# Patient Record
Sex: Female | Born: 1970 | Race: White | Hispanic: No | Smoking: Never smoker
Health system: Southern US, Community
[De-identification: ages and names within clinical notes are randomized; demographics above are authoritative.]

## PROBLEM LIST (undated history)

## (undated) DIAGNOSIS — I10 Essential (primary) hypertension: Secondary | ICD-10-CM

## (undated) DIAGNOSIS — G43909 Migraine, unspecified, not intractable, without status migrainosus: Secondary | ICD-10-CM

## (undated) DIAGNOSIS — E049 Nontoxic goiter, unspecified: Secondary | ICD-10-CM

## (undated) DIAGNOSIS — F32A Depression, unspecified: Secondary | ICD-10-CM

## (undated) DIAGNOSIS — Z8659 Personal history of other mental and behavioral disorders: Secondary | ICD-10-CM

## (undated) DIAGNOSIS — E669 Obesity, unspecified: Secondary | ICD-10-CM

## (undated) DIAGNOSIS — R634 Abnormal weight loss: Secondary | ICD-10-CM

## (undated) DIAGNOSIS — F419 Anxiety disorder, unspecified: Secondary | ICD-10-CM

## (undated) HISTORY — DX: Obesity, unspecified: E66.9

## (undated) HISTORY — DX: Nontoxic goiter, unspecified: E04.9

## (undated) HISTORY — DX: Anxiety disorder, unspecified: F41.9

## (undated) HISTORY — DX: Personal history of other mental and behavioral disorders: Z86.59

## (undated) HISTORY — DX: Depression, unspecified: F32.A

## (undated) HISTORY — DX: Migraine, unspecified, not intractable, without status migrainosus: G43.909

## (undated) HISTORY — DX: Essential (primary) hypertension: I10

## (undated) HISTORY — DX: Abnormal weight loss: R63.4

## (undated) HISTORY — PX: WISDOM TOOTH EXTRACTION: SHX21

---

## 1998-08-20 ENCOUNTER — Encounter: Admission: RE | Admit: 1998-08-20 | Discharge: 1998-11-18 | Payer: Self-pay | Admitting: *Deleted

## 1998-11-26 ENCOUNTER — Encounter: Admission: RE | Admit: 1998-11-26 | Discharge: 1999-02-24 | Payer: Self-pay | Admitting: *Deleted

## 1999-03-25 ENCOUNTER — Encounter: Admission: RE | Admit: 1999-03-25 | Discharge: 1999-06-23 | Payer: Self-pay | Admitting: *Deleted

## 1999-07-17 ENCOUNTER — Encounter: Admission: RE | Admit: 1999-07-17 | Discharge: 1999-10-15 | Payer: Self-pay | Admitting: *Deleted

## 1999-10-16 ENCOUNTER — Encounter: Admission: RE | Admit: 1999-10-16 | Discharge: 2000-01-14 | Payer: Self-pay | Admitting: *Deleted

## 2000-01-29 ENCOUNTER — Encounter: Admission: RE | Admit: 2000-01-29 | Discharge: 2000-04-28 | Payer: Self-pay | Admitting: *Deleted

## 2001-05-11 ENCOUNTER — Encounter: Payer: Self-pay | Admitting: Family Medicine

## 2001-05-11 ENCOUNTER — Encounter: Admission: RE | Admit: 2001-05-11 | Discharge: 2001-05-11 | Payer: Self-pay | Admitting: Family Medicine

## 2002-01-09 ENCOUNTER — Encounter: Payer: Self-pay | Admitting: Family Medicine

## 2002-01-09 ENCOUNTER — Encounter: Admission: RE | Admit: 2002-01-09 | Discharge: 2002-01-09 | Payer: Self-pay | Admitting: Family Medicine

## 2002-01-26 ENCOUNTER — Other Ambulatory Visit: Admission: RE | Admit: 2002-01-26 | Discharge: 2002-01-26 | Payer: Self-pay | Admitting: Obstetrics and Gynecology

## 2003-05-08 ENCOUNTER — Other Ambulatory Visit: Admission: RE | Admit: 2003-05-08 | Discharge: 2003-05-08 | Payer: Self-pay | Admitting: Obstetrics and Gynecology

## 2005-03-18 ENCOUNTER — Other Ambulatory Visit: Admission: RE | Admit: 2005-03-18 | Discharge: 2005-03-18 | Payer: Self-pay | Admitting: Obstetrics and Gynecology

## 2005-12-04 ENCOUNTER — Ambulatory Visit: Payer: Self-pay | Admitting: Family Medicine

## 2006-02-24 ENCOUNTER — Ambulatory Visit: Payer: Self-pay | Admitting: Family Medicine

## 2008-06-04 ENCOUNTER — Ambulatory Visit: Payer: Self-pay | Admitting: Family Medicine

## 2008-06-04 DIAGNOSIS — F418 Other specified anxiety disorders: Secondary | ICD-10-CM | POA: Insufficient documentation

## 2008-06-04 DIAGNOSIS — Z8659 Personal history of other mental and behavioral disorders: Secondary | ICD-10-CM | POA: Insufficient documentation

## 2008-06-04 DIAGNOSIS — I10 Essential (primary) hypertension: Secondary | ICD-10-CM | POA: Insufficient documentation

## 2008-06-04 DIAGNOSIS — G43009 Migraine without aura, not intractable, without status migrainosus: Secondary | ICD-10-CM | POA: Insufficient documentation

## 2008-06-05 DIAGNOSIS — E049 Nontoxic goiter, unspecified: Secondary | ICD-10-CM | POA: Insufficient documentation

## 2008-06-26 ENCOUNTER — Ambulatory Visit: Payer: Self-pay | Admitting: Family Medicine

## 2008-06-27 LAB — CONVERTED CEMR LAB
ALT: 25 units/L (ref 0–35)
AST: 25 units/L (ref 0–37)
Albumin: 4.1 g/dL (ref 3.5–5.2)
Alkaline Phosphatase: 72 units/L (ref 39–117)
BUN: 12 mg/dL (ref 6–23)
Basophils Absolute: 0 10*3/uL (ref 0.0–0.1)
Basophils Relative: 0.5 % (ref 0.0–3.0)
Bilirubin, Direct: 0.2 mg/dL (ref 0.0–0.3)
CO2: 30 meq/L (ref 19–32)
Calcium: 9.1 mg/dL (ref 8.4–10.5)
Chloride: 107 meq/L (ref 96–112)
Cholesterol: 148 mg/dL (ref 0–200)
Creatinine, Ser: 0.7 mg/dL (ref 0.4–1.2)
Eosinophils Absolute: 0.1 10*3/uL (ref 0.0–0.7)
Eosinophils Relative: 1.3 % (ref 0.0–5.0)
GFR calc Af Amer: 121 mL/min
GFR calc non Af Amer: 100 mL/min
Glucose, Bld: 98 mg/dL (ref 70–99)
HCT: 37.7 % (ref 36.0–46.0)
HDL: 46.7 mg/dL (ref >39.0)
Hemoglobin: 12.8 g/dL (ref 12.0–15.0)
LDL Cholesterol: 94 mg/dL (ref 0–99)
Lymphocytes Relative: 29.2 % (ref 12.0–46.0)
MCHC: 34 g/dL (ref 30.0–36.0)
MCV: 88.7 fL (ref 78.0–100.0)
Monocytes Absolute: 0.6 10*3/uL (ref 0.1–1.0)
Monocytes Relative: 9.4 % (ref 3.0–12.0)
Neutro Abs: 3.5 10*3/uL (ref 1.4–7.7)
Neutrophils Relative %: 59.6 % (ref 43.0–77.0)
Platelets: 242 10*3/uL (ref 150–400)
Potassium: 4.1 meq/L (ref 3.5–5.1)
RBC: 4.25 M/uL (ref 3.87–5.11)
RDW: 13.1 % (ref 11.5–14.6)
Sodium: 140 meq/L (ref 135–145)
TSH: 1.52 microintl units/mL (ref 0.35–5.50)
Total Bilirubin: 0.9 mg/dL (ref 0.3–1.2)
Total CHOL/HDL Ratio: 3.2
Total Protein: 7.5 g/dL (ref 6.0–8.3)
Triglycerides: 37 mg/dL (ref 0–149)
VLDL: 7 mg/dL (ref 0–40)
WBC: 6 10*3/uL (ref 4.5–10.5)

## 2008-07-05 ENCOUNTER — Ambulatory Visit: Payer: Self-pay | Admitting: Family Medicine

## 2008-10-12 ENCOUNTER — Ambulatory Visit: Payer: Self-pay | Admitting: Family Medicine

## 2009-01-11 ENCOUNTER — Ambulatory Visit: Payer: Self-pay | Admitting: Family Medicine

## 2009-04-16 ENCOUNTER — Ambulatory Visit: Payer: Self-pay | Admitting: Family Medicine

## 2009-08-07 ENCOUNTER — Ambulatory Visit: Payer: Self-pay | Admitting: Family Medicine

## 2010-04-30 ENCOUNTER — Ambulatory Visit: Payer: Self-pay | Admitting: Family Medicine

## 2010-05-01 LAB — CONVERTED CEMR LAB
ALT: 18 units/L (ref 0–35)
AST: 19 units/L (ref 0–37)
Albumin: 4.4 g/dL (ref 3.5–5.2)
Alkaline Phosphatase: 61 units/L (ref 39–117)
BUN: 19 mg/dL (ref 6–23)
Basophils Absolute: 0 10*3/uL (ref 0.0–0.1)
Basophils Relative: 0.2 % (ref 0.0–3.0)
Bilirubin, Direct: 0.2 mg/dL (ref 0.0–0.3)
CO2: 28 meq/L (ref 19–32)
Calcium: 8.8 mg/dL (ref 8.4–10.5)
Chloride: 100 meq/L (ref 96–112)
Cholesterol: 159 mg/dL (ref 0–200)
Creatinine, Ser: 0.7 mg/dL (ref 0.4–1.2)
Eosinophils Absolute: 0.1 10*3/uL (ref 0.0–0.7)
Eosinophils Relative: 1 % (ref 0.0–5.0)
Free T4: 0.98 ng/dL (ref 0.60–1.60)
GFR calc non Af Amer: 102.3 mL/min (ref >60)
Glucose, Bld: 99 mg/dL (ref 70–99)
HCT: 38.7 % (ref 36.0–46.0)
HDL: 58.2 mg/dL (ref >39.00)
Hemoglobin: 13.3 g/dL (ref 12.0–15.0)
LDL Cholesterol: 96 mg/dL (ref 0–99)
Lymphocytes Relative: 28.2 % (ref 12.0–46.0)
Lymphs Abs: 1.5 10*3/uL (ref 0.7–4.0)
MCHC: 34.3 g/dL (ref 30.0–36.0)
MCV: 89.7 fL (ref 78.0–100.0)
Monocytes Absolute: 0.4 10*3/uL (ref 0.1–1.0)
Monocytes Relative: 7 % (ref 3.0–12.0)
Neutro Abs: 3.4 10*3/uL (ref 1.4–7.7)
Neutrophils Relative %: 63.6 % (ref 43.0–77.0)
Platelets: 197 10*3/uL (ref 150.0–400.0)
Potassium: 4 meq/L (ref 3.5–5.1)
RBC: 4.32 M/uL (ref 3.87–5.11)
RDW: 13.7 % (ref 11.5–14.6)
Sodium: 137 meq/L (ref 135–145)
T3 Uptake Ratio: 35.2 % (ref 22.5–37.0)
TSH: 1.84 microintl units/mL (ref 0.35–5.50)
Total Bilirubin: 1.1 mg/dL (ref 0.3–1.2)
Total CHOL/HDL Ratio: 3
Total Protein: 7.4 g/dL (ref 6.0–8.3)
Triglycerides: 23 mg/dL (ref 0.0–149.0)
VLDL: 4.6 mg/dL (ref 0.0–40.0)
WBC: 5.4 10*3/uL (ref 4.5–10.5)

## 2010-09-10 ENCOUNTER — Telehealth (INDEPENDENT_AMBULATORY_CARE_PROVIDER_SITE_OTHER): Payer: Self-pay | Admitting: *Deleted

## 2010-09-12 ENCOUNTER — Ambulatory Visit: Payer: Self-pay | Admitting: Family Medicine

## 2010-09-16 ENCOUNTER — Ambulatory Visit: Payer: Self-pay | Admitting: Family Medicine

## 2010-11-05 ENCOUNTER — Other Ambulatory Visit: Payer: Self-pay

## 2010-11-05 HISTORY — PX: OTHER SURGICAL HISTORY: SHX169

## 2010-11-06 ENCOUNTER — Other Ambulatory Visit: Payer: Self-pay

## 2010-11-06 NOTE — Assessment & Plan Note (Signed)
Summary: 3 MONTH FOLLOW UP/[facility]   Vital Signs:  Patient Profile:   40 Years Old Female Weight:      244 pounds Temp:     98.8 degrees F oral Pulse rate:   56 / minute Pulse rhythm:   regular BP sitting:   120 / 84  (left arm) Cuff size:   large  Vitals Entered By: Providence Crosby (October 12, 2008 9:57 AM)                 Chief Complaint:  3 month followup.  History of Present Illness: Here for follow up of HBP, obesity, depression.  Doing well with depression not on meds--work still stressful.  Does not wasnt meds.  Obesity--was 266 on 07/05/08--going to [employer] and to the gym 4-5xqwk--treadmill 30 min each time plus some wts.  Son adn husband loosing some and going to gym as a family.  Son here with her --all smiles.  HBP--tolerating atenolol without side effects.    Prior Medications Reviewed Using: Patient Recall  Current Allergies: No known allergies      Review of Systems  CV      Denies chest pain or discomfort and palpitations.  Resp      Denies cough, shortness of breath, and wheezing.  Neuro      Denies difficulty with concentration, disturbances in coordination, and headaches.  Psych      See HPI      Denies anxiety and depression.   Physical Exam  General:     alert, well-developed, well-nourished, and well-hydrated.  wt lsoo of 22 lbs since 10/09 Neck:     no JVD and no carotid bruits.   Lungs:     normal respiratory effort, no intercostal retractions, no accessory muscle use, and normal breath sounds.   Heart:     normal rate, regular rhythm, and no murmur.   Extremities:     no edema either lower leg Neurologic:     alert & oriented X3 and gait normal.   Psych:     normally interactive, good eye contact, not anxious appearing, and not depressed appearing.  HAPPY!!!!    Impression & Recommendations:  Problem # 1:  HYPERTENSION, BENIGN ESSENTIAL (ICD-401.1) Assessment: Unchanged stable on current med continue current  med see back in 3 mo or prn Her updated medication list for this problem includes:    Atenolol 100 Mg Tabs (Atenolol) .Marland Kitchen... Take 1 tablet by mouth once a day  BP today: 120/84 Prior BP: 107/79 (07/05/2008)  Labs Reviewed: Creat: 0.7 (06/26/2008) Chol: 148 (06/26/2008)   HDL: 46.7 (06/26/2008)   LDL: 94 (06/26/2008)   TG: 37 (06/26/2008)   Problem # 2:  OBESITY (ICD-278.00) Assessment: Improved very pleased with her 22 lb wt loss--encouraged to continue see back in 3 mo  Problem # 3:  DEPRESSION (ICD-311) Assessment: Unchanged stable on no meds improved with wt losss  Complete Medication List: 1)  Atenolol 100 Mg Tabs (Atenolol) .... Take 1 tablet by mouth once a day 2)  Multivitamins Tabs (Multiple vitamin) .Marland Kitchen.. 1 daily by mouth   Patient Instructions: 1)  Please schedule a follow-up appointment in 3 months.--15 lbs off   ]

## 2010-11-06 NOTE — Progress Notes (Signed)
----   Converted from flag ---- ---- 09/09/2010 8:26 PM, Judith Part MD wrote: please check wellness and lipid v70.0 and 401.1 thanks  ---- 09/09/2010 12:11 PM, Liane Comber CMA (AAMA) wrote: Lab orders please! Good Morning! This pt is scheduled for cpx labs [place], which labs to draw and dx codes to use? Thanks Tasha ------------------------------

## 2010-11-06 NOTE — Assessment & Plan Note (Signed)
Summary: 1 M F/U  DLO   Vital Signs:  Patient Profile:   39 Years Old Female Weight:      266 pounds Temp:     98.7 degrees F oral Pulse rate:   55 / minute BP sitting:   107 / 79  (left arm) Cuff size:   large  Vitals Entered By: Cooper Render (July 05, 2008 9:32 AM)                 Chief Complaint:  1 mth f/u.  History of Present Illness: Here for follow up of HBP and obesity --tolerating atenolol which was restarted 1 mo ago --joined [employer] recently--very pleased with 4 lbs off--walking the dog and doing stationary bike almost daily.  eating patterns have made some major changes. --has new hair style, happpier with herself    Current Allergies (reviewed today): No known allergies      Review of Systems  CV      Denies chest pain or discomfort, palpitations, swelling of feet, and swelling of hands.  Resp      Denies shortness of breath and wheezing.  Psych      Denies anxiety and depression.   Physical Exam  General:     alert, well-developed, well-nourished, and well-hydrated.  wt down 4 lbs in past mo Neck:     no JVD, no carotid bruits, and thyromegaly.   Lungs:     normal respiratory effort, no intercostal retractions, no accessory muscle use, and normal breath sounds.   Heart:     normal rate, regular rhythm, and no murmur.   Extremities:     no edema either lower leg Neurologic:     alert & oriented X3 and gait normal.   Psych:     normally interactive, not anxious appearing, and not depressed appearing.      Impression & Recommendations:  Problem # 1:  HYPERTENSION, BENIGN ESSENTIAL (ICD-401.1) Assessment: Improved BP ios improved since restart of Atenolol will continue Atenolol at this dose and see back in 3 mo to follow, if stable will then see q43mo Her updated medication list for this problem includes:    Atenolol 100 Mg Tabs (Atenolol) .Marland Kitchen... Take 1 tablet by mouth once a day  BP today: 107/79 Prior BP: 134/104  (06/04/2008)  Labs Reviewed: Creat: 0.7 (06/26/2008) Chol: 148 (06/26/2008)   HDL: 46.7 (06/26/2008)   LDL: 94 (06/26/2008)   TG: 37 (06/26/2008)   Problem # 2:  OBESITY (ICD-278.00) Assessment: Improved she has had a 4 lb wt loss in the past mo but made major changes in her eating pattersn--ie, joining [employer] and starting to exercise.   much praise given and encouraged to continue--hopefully to see 10 lbs off in the next 3 mo  Problem # 3:  DEPRESSION (ICD-311) Assessment: Improved seems much happier with herself andf the changes that she has made.  encouraged follow  Complete Medication List: 1)  Atenolol 100 Mg Tabs (Atenolol) .... Take 1 tablet by mouth once a day   Patient Instructions: 1)  Please schedule a follow-up appointment in 3 months.   ] Prior Medications (reviewed today): ATENOLOL 100 MG TABS (ATENOLOL) Take 1 tablet by mouth once a day Current Allergies (reviewed today): No known allergies

## 2010-11-06 NOTE — Assessment & Plan Note (Signed)
Summary: 3 M F/U DLO   Vital Signs:  Patient profile:   40 year old female Height:      67 inches Weight:      213.25 pounds BMI:     33.52 Temp:     98.6 degrees F oral Pulse rate:   60 / minute Pulse rhythm:   regular BP sitting:   128 / 82  (left arm) Cuff size:   large  Vitals Entered By: Lewanda Rife LPN (August 07, 2009 2:22 PM)  CC:  three month follow up.  History of Present Illness: Here for 3 mo follow up of HBP and wt loss--tolerating atenolol without side effects --has cut going to gym back to 3xwk rather than 4-5--sees that wt is not coming offf as rapidly as it did with more exercise and no change in food intake--will attempt to increase exercise--may walk at home on the weekends rather than trip to gym  Preventive Screening-Counseling & Management  Caffeine-Diet-Exercise     Does Patient Exercise: yes     Type of exercise: treadmill, bike or elipitical     Exercise (avg: min/session): 30-60     Times/week: 3  Allergies (verified): No Known Drug Allergies  Social History: Does Patient Exercise:  yes  Review of Systems      See HPI  Physical Exam  General:  alert, well-developed, well-nourished, and well-hydrated.  2 lb wt loss in 3 mo   Impression & Recommendations:  Problem # 1:  HYPERTENSION, BENIGN ESSENTIAL (ICD-401.1) Assessment Unchanged stable on current dose of atenolol--continue see back in 6 mo or as needed --will need fasting labs just prior to next visit Her updated medication list for this problem includes:    Atenolol 50 Mg Tabs (Atenolol) .Marland Kitchen... 1 once daily for bp  BP today: 128/82 Prior BP: 130/82 (04/16/2009)  Labs Reviewed: K+: 4.1 (06/26/2008) Creat: : 0.7 (06/26/2008)   Chol: 148 (06/26/2008)   HDL: 46.7 (06/26/2008)   LDL: 94 (06/26/2008)   TG: 37 (06/26/2008)  Problem # 2:  OBESITY (ICD-278.00) Assessment: Unchanged has lost from 266 to current 213 in past 1 yr--much praise, encouraged to increase exercise as discussed  in HPI follow  Complete Medication List: 1)  Multivitamins Tabs (Multiple vitamin) .Marland Kitchen.. 1 daily by mouth 2)  Aspirin 81 Mg Tabs (Aspirin) .... One daily as needed 3)  Atenolol 50 Mg Tabs (Atenolol) .Marland Kitchen.. 1 once daily for bp  Patient Instructions: 1)  Please schedule a follow-up appointment in 6 months. 2)  schedule fasting labs 3-4 d prior to appt---[place] panel and Lipids--v70.0  Current Allergies (reviewed today): No known allergies

## 2010-11-06 NOTE — Assessment & Plan Note (Signed)
Summary: 3 mo.f/u/bir   Vital Signs:  Patient profile:   40 year old female Height:      67 inches Weight:      223 pounds BMI:     35.05 Temp:     98.8 degrees F oral Pulse rate:   64 / minute Pulse rhythm:   regular BP sitting:   100 / 70  (left arm) Cuff size:   large  Vitals Entered By: Providence Crosby (January 11, 2009 9:37 AM) CC: 3 month followup   CC:  3 month followup.  History of Present Illness: Here for follow up of HBP and obesity --continues gym 45-60 min 4-5xwk --continues on [employer] pleased with her wt loss and that of her husband and son --feels better than in yrs  Allergies (verified): No Known Drug Allergies  Past History:  Review of Systems CV:  Denies chest pain or discomfort, palpitations, swelling of feet, and swelling of hands. Resp:  Denies cough, shortness of breath, and wheezing. GI:  Denies constipation, diarrhea, nausea, and vomiting. MS:  Denies joint pain and muscle aches. Psych:  Denies anxiety and depression.  Physical Exam  General:  alert, well-developed, well-nourished, well-hydrated, and overweight-appearing.   wt--270--8/09        266--10/09        244--10/12/2008        223--today         total of 43 lbs since start of [employer] Lungs:  normal respiratory effort, no intercostal retractions, no accessory muscle use, and normal breath sounds.   Heart:  normal rate, regular rhythm, and no murmur.   Extremities:  no edema either lower leg Neurologic:  alert & oriented X3 and gait normal.   Psych:  normally interactive and good eye contact.  HAPPY   Impression & Recommendations:  Problem # 1:  HYPERTENSION, BENIGN ESSENTIAL (ICD-401.1) Assessment Improved due to wt loss and exercise, feel that we can reduce atenolol to 50mg  once daily see back in 3 mo to follow reduction of med to get BP checked in 2 wks at [facility] and call in  Her updated medication list for this problem includes:    Atenolol 100 Mg Tabs (Atenolol) .Marland Kitchen...  Take 1 tablet by mouth once a day  Problem # 2:  OBESITY (ICD-278.00) Assessment: Improved marked improvement ---much praise follow  Complete Medication List: 1)  Atenolol 100 Mg Tabs (Atenolol) .... Take 1 tablet by mouth once a day 2)  Multivitamins Tabs (Multiple vitamin) .Marland Kitchen.. 1 daily by mouth  Patient Instructions: 1)  Please schedule a follow-up appointment in 3 months.

## 2010-11-06 NOTE — Assessment & Plan Note (Signed)
Summary: MEDS AND BP/DLO   Vital Signs:  Patient Profile:   40 Years Old Female Weight:      270 pounds Temp:     99.4 degrees F oral Pulse rate:   84 / minute BP sitting:   134 / 104  (left arm) Cuff size:   large  Vitals Entered By: Cooper Render (June 04, 2008 3:57 PM)                 Chief Complaint:  f/u HTN and out of med x 2 wks.  History of Present Illness: Here for follow up of HBP--out of meds x 2wks --saw Dr Meryl Crutch 7/08--has been Rxing meds. Migraines around menstral periods only--uses IBP or ASA. --aware of wt gain --increased stress at work, eats when not happy.  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  Resp      Denies cough, shortness of breath, and wheezing.  Derm      uses sun screen if at the beach does tanning beds occ.  Psych      Complains of depression.   Physical Exam  General:     alert, well-developed, well-nourished, and well-hydrated.  50 lb wt gain since last visit Neck:     no carotid bruits and thyromegaly.   Lungs:     normal respiratory effort, no intercostal retractions, no accessory muscle use, and normal breath sounds.   Heart:     normal rate, regular rhythm, and no murmur.   Psych:     normally interactive, subdued, and tearful.      Impression &  Recommendations:  Problem # 1:  HYPERTENSION, BENIGN ESSENTIAL (ICD-401.1) Assessment: Deteriorated elevation in diastolic BP off med will restatr Atenolol at previous dose see back in 1 mo--titrate med if needed Her updated medication list for this problem includes:    Atenolol 100 Mg Tabs (Atenolol) .Marland Kitchen... Take 1 tablet by mouth once a day   Problem # 2:  OBESITY (ICD-278.00) Assessment: Deteriorated strongly encouraged reduction in calories and beginning exercise slowly  that she enjoys encouraged 5 lbs off in next 1 mo  Problem # 3:  COMMON MIGRAINE (ICD-346.10) Assessment: Unchanged stable at this time--sees Dr Meryl Crutch  Her updated medication list for this problem includes:    Atenolol 100 Mg Tabs (Atenolol) .Marland Kitchen... Take 1 tablet by mouth once a day   Problem # 4:  DEPRESSION (ICD-311) Assessment: New discussed possible tx but would like to try wt loss and taking care of herself before meds  in room 30 min  Complete Medication List: 1)  Atenolol 100 Mg Tabs (Atenolol) .... Take 1 tablet by mouth  once a day   Patient Instructions: 1)  Please schedule a follow-up appointment in 1 month. 2)  schedule in for fasting labs--v70.0--[place] panel and lipids   Prescriptions: ATENOLOL 100 MG TABS (ATENOLOL) Take 1 tablet by mouth once a day  #30 x 6   Entered and Authorized by:   Gildardo Griffes FNP   Signed by:   Gildardo Griffes FNP on 06/04/2008   Method used:   Print then Give to Patient   RxID:   [id number]  ] Prior Medications (reviewed today): ATENOLOL 100 MG TABS (ATENOLOL) Take 1 tablet by mouth once a day Current Allergies (reviewed today): No known allergies

## 2010-11-06 NOTE — Assessment & Plan Note (Signed)
Summary: ESTABH FROM [facility]/DLO   Vital Signs:  Patient profile:   40 year old female Height:      67 inches Weight:      230.25 pounds BMI:     36.19 Temp:     98.2 degrees F oral Pulse rate:   72 / minute Pulse rhythm:   regular BP sitting:   124 / 80  (left arm) Cuff size:   large  Vitals Entered By: Lewanda Rife LPN (April 30, 2010 8:37 AM) CC: Establish from [facility]   History of Present Illness: here to est from [facility]   wt is up 17 lb from last visit   Htn - well controled with bp 124/80 has been running well on her med  also exercises regularly -- [employer] with her family  goes 3-4 days per week - would like to go more  did stop for 2 mo   eating is also back to healthy   was off track with tremendous work stress -- in spring  things are better now    hx of migaine used to be on vivactil for this and dep-- and then went off of it  hx of depression - that is good now   hx of goiter - no change in it at all , no nodules (has been watched since she was a kid)  has done one [place] in the past and some panels  is overdue for labs   used to weight 400lb-- much better   is overdue for health mt and would like to schedule an exam     Allergies (verified): No Known Drug Allergies  Past History:  Past Surgical History: Last updated: 06/04/2008 wisdom teeth  C'section  Family History: Last updated: 04/30/2010 father: D) age 19 renal cell CA, parkinson's (passed in 23) mother: L) & W age 76 brother: L) & W  Social History: Last updated: 06/04/2008 Marital Status: Married Children: 1--son 14 (8/09) Occupation: Conservator, 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 Eyes:  Denies blurring and eye irritation. CV:  Denies chest pain or discomfort, lightheadness, and palpitations. Resp:  Denies cough, shortness of breath, and wheezing. GI:  Denies abdominal pain, change in bowel habits, and indigestion. GU:  Denies abnormal vaginal bleeding, discharge, and dysuria. MS:  Denies joint pain, joint redness, joint swelling, muscle aches, cramps, muscle weakness, and stiffness. Derm:  Denies itching, lesion(s), poor wound healing, and rash. Neuro:  Denies numbness and tingling. Psych:  Denies anxiety and depression. Endo:  Denies cold intolerance, excessive thirst, excessive urination, and heat intolerance. Heme:  Denies abnormal bruising and bleeding.  Physical Exam  General:  obese and well appearing  Head:  normocephalic, atraumatic, and no abnormalities observed.   Eyes:  vision grossly intact, pupils equal, pupils round, pupils reactive to light, and no injection.   Mouth:  pharynx pink and moist.   Neck:  no JVD and no carotid bruits.   moderate sized firm and symmetric goiter present that  is nontender and without thyroid bruit Chest Wall:  No deformities, masses, or tenderness noted. Lungs:  Normal respiratory effort, chest expands symmetrically. Lungs are clear to auscultation, no crackles or wheezes. Heart:  Normal rate and regular rhythm. S1 and S2 normal without gallop, murmur, click, rub or other extra sounds. Abdomen:  Bowel sounds positive,abdomen soft and non-tender without masses, organomegaly or hernias noted. no renal bruits  Msk:  No deformity or scoliosis noted of thoracic or lumbar spine.  no acute joint changes  Pulses:  R and L carotid,radial,femoral,dorsalis pedis and posterior tibial pulses are full and equal  bilaterally Extremities:  No clubbing, cyanosis, edema, or deformity noted with normal full range of motion of all joints.   Neurologic:  sensation intact to light touch, gait normal, and DTRs symmetrical and normal.   Skin:  Intact without suspicious lesions or rashes Cervical Nodes:  No lymphadenopathy noted Inguinal Nodes:  No significant adenopathy Psych:  normal affect, talkative and pleasant    Impression & Recommendations:  Problem # 1:  HYPERTENSION, BENIGN ESSENTIAL (ICD-401.1) Assessment Unchanged  this is well controlled with low dose atenolol  will continue this/ refilled disc low sodium diet and continued exercise lab today f/u health mt in the  fall  Her updated medication list for this problem includes:    Atenolol 50 Mg Tabs (Atenolol) .Marland Kitchen... 1 by mouth once daily  Orders: Venipuncture (04540) TLB-Lipid Panel (80061-LIPID) TLB-BMP (Basic Metabolic Panel-BMET) (80048-METABOL) TLB-CBC Platelet - w/Differential (85025-CBCD) TLB-Hepatic/Liver Function Pnl (80076-HEPATIC) TLB-TSH (Thyroid Stimulating Hormone) (84443-TSH) TLB-T3 Uptake (84479-T3UP) TLB-T4 (Thyrox), Free [phone]) Prescription Created Electronically [id number])  BP today: 124/80 Prior BP: 128/82 (08/07/2009)  Labs Reviewed: K+: 4.1 (06/26/2008) Creat: : 0.7 (06/26/2008)   Chol: 148 (06/26/2008)   HDL: 46.7 (06/26/2008)   LDL: 94 (06/26/2008)   TG: 37 (06/26/2008)  Problem # 2:  GOITER, UNSPECIFIED (ICD-240.9) Assessment: Unchanged per pt - no change at all check thyroid prof  ? last thyroid US - will check into that  disc results at [facility] Orders: Venipuncture ([id number]) TLB-Lipid Panel (80061-LIPID) TLB-BMP (Basic Metabolic Panel-BMET) (80048-METABOL) TLB-CBC Platelet - w/Differential (85025-CBCD) TLB-Hepatic/Liver Function Pnl (80076-HEPATIC) TLB-TSH (Thyroid Stimulating Hormone) (84443-TSH) TLB-T3 Uptake (84479-T3UP) TLB-T4 (Thyrox), Free (57846-NG2X)  Problem # 3:  COMMON MIGRAINE  (ICD-346.10) Assessment: Improved this has been controlled with wt loss and atenolol  disc good habits  Her updated medication list for this problem includes:    Aspirin 81 Mg Tabs (Aspirin) ..... One daily as needed    Atenolol 50 Mg Tabs (Atenolol) .Marland Kitchen... 1 by mouth once daily  Problem # 4:  OBESITY (ICD-278.00) Assessment: Deteriorated loss and then gain again is starting to make progress with wt watchers and exercise - enc to keep working on it   Complete Medication List: 1)  Multivitamins Tabs (Multiple vitamin) .Marland Kitchen.. 1 daily by mouth 2)  Aspirin 81 Mg Tabs (Aspirin) .... One daily as needed 3)  Atenolol 50 Mg Tabs (Atenolol) .Marland Kitchen.. 1 by mouth once daily  Patient Instructions: 1)  labs today 2)  no change in medicines 3)  please set up PE in the fall - when able 4)  keep up the good work with diet and exercise Prescriptions: ATENOLOL 50 MG TABS (ATENOLOL) 1 by mouth once daily  #30 x 11   Entered and Authorized by:   Judith Part MD   Signed by:   Judith Part MD on 04/30/2010   Method used:   Electronically to  [facility] [address] (retail)       [address]       [place], [place]  [address]       Ph: [phone]       Fax: [phone]   RxID:   [id number]   Current Allergies (reviewed today): No known allergies

## 2010-11-06 NOTE — Assessment & Plan Note (Signed)
Summary: 3 MTH FOLLOW UP / LFW   Vital Signs:  Patient profile:   40 year old female Height:      67 inches Weight:      215.25 pounds BMI:     33.83 Temp:     98.7 degrees F oral Pulse rate:   68 / minute Pulse rhythm:   regular BP sitting:   130 / 82  (left arm) Cuff size:   large  Vitals Entered By: Lewanda Rife LPN (April 16, 2009 10:00 AM)  CC:  three month follow up BP.  History of Present Illness: Here for follow up of HBP--taking atenolol 50mg  once daily--tolerating--is hard to cut small 100mg  tabs --dose was reduced 01/11/2009 to 50 mg qd  Continueing to go to [employer] and gym---support is good with husband and son loosing too  Allergies (verified): No Known Drug Allergies  Past History:  Review of Systems      See HPI CV:  Denies chest pain or discomfort and palpitations. Resp:  Denies cough, shortness of breath, and wheezing. GI:  Denies nausea and vomiting. MS:  Denies joint pain and muscle aches.  Physical Exam  General:  alert, well-developed, well-nourished, and well-hydrated.  has lost another 8 lbs in 3 mo--39 lbs since 10/2008 Neck:  no JVD and no carotid bruits.   Lungs:  normal respiratory effort, no intercostal retractions, no accessory muscle use, and normal breath sounds.   Heart:  normal rate, regular rhythm, and no murmur.   Neurologic:  alert & oriented X3 and gait normal.   Psych:  normally interactive and slightly anxious.     Impression & Recommendations:  Problem # 1:  HYPERTENSION, BENIGN ESSENTIAL (ICD-401.1) Assessment Unchanged stable on reduced dose of atenolol--continue see back in 3 mo or as needed  The following medications were removed from the medication list:    Atenolol 100 Mg Tabs (Atenolol) .Marland Kitchen... Take 1 tablet by mouth once a day Her updated medication list for this problem includes:    Atenolol 50 Mg Tabs (Atenolol) .Marland Kitchen... 1 once daily for bp  BP today: 130/82 Prior BP: 100/70 (01/11/2009)  Labs Reviewed: K+: 4.1  (06/26/2008) Creat: : 0.7 (06/26/2008)   Chol: 148 (06/26/2008)   HDL: 46.7 (06/26/2008)   LDL: 94 (06/26/2008)   TG: 37 (06/26/2008)  Problem # 2:  OBESITY (ICD-278.00) Assessment: Improved continued wt loss--praised and encouraged  Complete Medication List: 1)  Multivitamins Tabs (Multiple vitamin) .Marland Kitchen.. 1 daily by mouth 2)  Aspirin 81 Mg Tabs (Aspirin) .... One daily as needed 3)  Atenolol 50 Mg Tabs (Atenolol) .Marland Kitchen.. 1 once daily for bp  Patient Instructions: 1)  Please schedule a follow-up appointment in 3 months. Prescriptions: ATENOLOL 50 MG TABS (ATENOLOL) 1 once daily for BP  #30 x 6   Entered and Authorized by:   Gildardo Griffes FNP   Signed by:   Gildardo Griffes FNP on 04/16/2009   Method used:   Electronically to        [facility] (retail)       [address]       [place], [place]  [address]       Ph: [phone]       Fax: [phone]   RxID:   [id number]   Current Allergies (reviewed today): No known allergies

## 2010-11-07 ENCOUNTER — Other Ambulatory Visit (INDEPENDENT_AMBULATORY_CARE_PROVIDER_SITE_OTHER): Payer: BC Managed Care – PPO

## 2010-11-07 ENCOUNTER — Other Ambulatory Visit: Payer: Self-pay | Admitting: Family Medicine

## 2010-11-07 ENCOUNTER — Encounter (INDEPENDENT_AMBULATORY_CARE_PROVIDER_SITE_OTHER): Payer: Self-pay | Admitting: *Deleted

## 2010-11-07 DIAGNOSIS — E049 Nontoxic goiter, unspecified: Secondary | ICD-10-CM

## 2010-11-07 DIAGNOSIS — I10 Essential (primary) hypertension: Secondary | ICD-10-CM

## 2010-11-07 DIAGNOSIS — Z0000 Encounter for general adult medical examination without abnormal findings: Secondary | ICD-10-CM

## 2010-11-07 DIAGNOSIS — E669 Obesity, unspecified: Secondary | ICD-10-CM

## 2010-11-07 LAB — TSH: TSH: 1.77 u[IU]/mL (ref 0.35–5.50)

## 2010-11-07 LAB — BASIC METABOLIC PANEL
BUN: 13 mg/dL (ref 6–23)
CO2: 28 mEq/L (ref 19–32)
Calcium: 9.2 mg/dL (ref 8.4–10.5)
Chloride: 104 mEq/L (ref 96–112)
Creatinine, Ser: 0.7 mg/dL (ref 0.4–1.2)
GFR: 100.32 mL/min (ref >60.00)
Glucose, Bld: 87 mg/dL (ref 70–99)
Potassium: 4.1 mEq/L (ref 3.5–5.1)
Sodium: 139 mEq/L (ref 135–145)

## 2010-11-07 LAB — CBC WITH DIFFERENTIAL/PLATELET
Basophils Absolute: 0 10*3/uL (ref 0.0–0.1)
Basophils Relative: 0.4 % (ref 0.0–3.0)
Eosinophils Absolute: 0.1 10*3/uL (ref 0.0–0.7)
Eosinophils Relative: 0.8 % (ref 0.0–5.0)
HCT: 40.2 % (ref 36.0–46.0)
Hemoglobin: 13.9 g/dL (ref 12.0–15.0)
Lymphocytes Relative: 30.1 % (ref 12.0–46.0)
Lymphs Abs: 1.9 10*3/uL (ref 0.7–4.0)
MCHC: 34.5 g/dL (ref 30.0–36.0)
MCV: 89.5 fl (ref 78.0–100.0)
Monocytes Absolute: 0.5 10*3/uL (ref 0.1–1.0)
Monocytes Relative: 7.3 % (ref 3.0–12.0)
Neutro Abs: 3.9 10*3/uL (ref 1.4–7.7)
Neutrophils Relative %: 61.4 % (ref 43.0–77.0)
Platelets: 198 10*3/uL (ref 150.0–400.0)
RBC: 4.49 Mil/uL (ref 3.87–5.11)
RDW: 13.8 % (ref 11.5–14.6)
WBC: 6.4 10*3/uL (ref 4.5–10.5)

## 2010-11-07 LAB — HEPATIC FUNCTION PANEL
ALT: 20 U/L (ref 0–35)
AST: 20 U/L (ref 0–37)
Albumin: 4.2 g/dL (ref 3.5–5.2)
Alkaline Phosphatase: 64 U/L (ref 39–117)
Bilirubin, Direct: 0.1 mg/dL (ref 0.0–0.3)
Total Bilirubin: 0.7 mg/dL (ref 0.3–1.2)
Total Protein: 7.3 g/dL (ref 6.0–8.3)

## 2010-11-07 LAB — LIPID PANEL
Cholesterol: 166 mg/dL (ref 0–200)
HDL: 55.2 mg/dL (ref >39.00)
LDL Cholesterol: 102 mg/dL — ABNORMAL HIGH (ref 0–99)
Total CHOL/HDL Ratio: 3
Triglycerides: 44 mg/dL (ref 0.0–149.0)
VLDL: 8.8 mg/dL (ref 0.0–40.0)

## 2010-11-11 ENCOUNTER — Other Ambulatory Visit: Payer: Self-pay | Admitting: Family Medicine

## 2010-11-11 ENCOUNTER — Encounter (INDEPENDENT_AMBULATORY_CARE_PROVIDER_SITE_OTHER): Payer: Self-pay | Admitting: *Deleted

## 2010-11-11 ENCOUNTER — Other Ambulatory Visit (HOSPITAL_COMMUNITY)
Admission: RE | Admit: 2010-11-11 | Discharge: 2010-11-11 | Disposition: A | Discharge status: Home / Self Care | Payer: BC Managed Care – PPO | Source: Ambulatory Visit | Attending: Family Medicine | Admitting: Family Medicine

## 2010-11-11 ENCOUNTER — Encounter (INDEPENDENT_AMBULATORY_CARE_PROVIDER_SITE_OTHER): Payer: BC Managed Care – PPO | Admitting: Family Medicine

## 2010-11-11 ENCOUNTER — Encounter: Payer: Self-pay | Admitting: Family Medicine

## 2010-11-11 DIAGNOSIS — Z0000 Encounter for general adult medical examination without abnormal findings: Secondary | ICD-10-CM

## 2010-11-11 DIAGNOSIS — E049 Nontoxic goiter, unspecified: Secondary | ICD-10-CM

## 2010-11-11 DIAGNOSIS — Z01419 Encounter for gynecological examination (general) (routine) without abnormal findings: Secondary | ICD-10-CM

## 2010-11-11 DIAGNOSIS — Z1159 Encounter for screening for other viral diseases: Secondary | ICD-10-CM | POA: Insufficient documentation

## 2010-11-11 DIAGNOSIS — Z124 Encounter for screening for malignant neoplasm of cervix: Secondary | ICD-10-CM | POA: Insufficient documentation

## 2010-11-11 DIAGNOSIS — I10 Essential (primary) hypertension: Secondary | ICD-10-CM

## 2010-11-11 LAB — CONVERTED CEMR LAB: Pap Smear: NORMAL

## 2010-11-19 ENCOUNTER — Other Ambulatory Visit: Payer: Self-pay | Admitting: Family Medicine

## 2010-11-19 DIAGNOSIS — E041 Nontoxic single thyroid nodule: Secondary | ICD-10-CM

## 2010-11-20 ENCOUNTER — Telehealth (INDEPENDENT_AMBULATORY_CARE_PROVIDER_SITE_OTHER): Payer: Self-pay | Admitting: *Deleted

## 2010-11-20 ENCOUNTER — Encounter (INDEPENDENT_AMBULATORY_CARE_PROVIDER_SITE_OTHER): Payer: Self-pay | Admitting: *Deleted

## 2010-11-20 NOTE — Assessment & Plan Note (Signed)
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)  is out of the loop -- is isolated - people person  although less hours and less   would like to work exercise into her schedule does like going to the gym  has a membership   is thinking about wt watchers   wt is up 15lb  128/80 good bp today- hx of HTN  labs look good nl tsh  lipids good with trig 44 and HDL 55 and LDL 96   hx of goiter -- last thyroid US was 2008 or 2009 -- has had 2 of them done  no change in goiter for her   wt is up 15 lb with bmi of 38    (note pt used to weigh 400 lb)   need to do pap smear  peroids are regular  no birth control now  was on depo for about a year  did not tolerate OCs in the past  is using condoms      Allergies (verified): No Known Drug Allergies  Past History:  Past Medical History: Last updated: 04/30/2010 goiter obesity  HTN  migraines  depression in past  used to weigh over 400 lb- significant loss  Past Surgical History: Last updated: 06/04/2008 wisdom teeth  C'section  Family History: Last updated: 04/30/2010 father: D) age 67 renal cell CA, parkinson's (passed in 62) mother: L) & W age 67 brother: L) & W  Social History: Last updated: 06/04/2008 Marital Status: Married Children: 1--son 14 (8/09) 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 Eyes:  Denies blurring and eye irritation. CV:  Denies chest pain or discomfort, lightheadness, palpitations, and shortness of breath with exertion. Resp:  Denies cough and shortness of breath. GI:  Denies abdominal pain, indigestion, and nausea. GU:  Denies dysuria and urinary frequency. MS:  Denies cramps, muscle weakness, and stiffness. Derm:  Denies itching and rash. Neuro:  Denies numbness, tingling, tremors, and visual disturbances. Psych:  Denies anxiety and depression. Endo:  Denies excessive thirst and excessive urination. Heme:  Denies abnormal bruising and bleeding.  Physical Exam  General:  overweight but generally well appearing  Head:  normocephalic, atraumatic, and no abnormalities observed.   Eyes:  vision grossly intact, pupils equal, pupils round, and pupils reactive to light.  no conjunctival pallor, injection or icterus  Ears:  R ear normal and L ear normal.   Nose:  no nasal discharge.   Mouth:  pharynx pink and moist.   Neck:  supple with full rom and no masses or thyromegally, no JVD or carotid bruit  Chest Wall:  No deformities, masses, or tenderness noted. Breasts:  No mass, nodules, thickening, tenderness, bulging, retraction, inflamation, nipple discharge or skin changes noted.   Lungs:  Normal respiratory effort, chest expands symmetrically. Lungs are clear to auscultation, no crackles or wheezes. Heart:  Normal rate and regular rhythm. S1 and S2 normal without gallop, murmur, click, rub or other extra sounds. Abdomen:  Bowel sounds positive,abdomen soft and non-tender without masses, organomegaly or hernias noted. no renal bruits  Genitalia:  Normal introitus for age, no external lesions,  no vaginal discharge, mucosa pink and moist, no vaginal or cervical lesions, no vaginal atrophy, no friaility or hemorrhage, normal uterus size and position, no adnexal masses or tenderness Msk:  No deformity or scoliosis noted of thoracic or lumbar spine.  no acute joint changes  Pulses:  R and L carotid,radial,femoral,dorsalis pedis and posterior tibial pulses are full and equal bilaterally Extremities:  No clubbing, cyanosis, edema, or deformity noted with normal full range of motion of all joints.   Neurologic:  sensation intact to light touch, gait normal, and DTRs symmetrical and normal.   Skin:  Intact without suspicious lesions or rashes Cervical Nodes:  No lymphadenopathy noted Inguinal Nodes:  No significant adenopathy Psych:  normal affect, talkative and pleasant    Impression & Recommendations:  Problem # 1:  HEALTH MAINTENANCE EXAM (ICD-V70.0) Assessment Comment Only reviewed health habits including diet, exercise and skin cancer prevention reviewed health maintenance list and family history  reviewed wellness labs  lipids look good  enc working on wt loss with exercise and weight watchers program  Problem # 2:  ROUTINE GYNECOLOGICAL EXAMINATION (ICD-V72.31) Assessment: Comment Only annual exam with pap nl peroids  is considering sterilization for contraception- husband or self enc condoms until then   Problem # 3:  GOITER, UNSPECIFIED (ICD-240.9) Assessment: Unchanged  unchanged exam and labs will review old records ie: last [place]   Orders: Radiology Referral (Radiology)  Problem # 4:  OBESITY (ICD-278.00) Assessment: Deteriorated disc plan to exercise daily after work- look for an exercise buddy  also plan to join weight watchers again   Problem # 5:  HYPERTENSION, BENIGN ESSENTIAL (ICD-401.1) Assessment: Unchanged good control with atenolol no changes  rev labs  Her updated medication list for this problem includes:    Atenolol 50 Mg Tabs (Atenolol) .Marland Kitchen...  1 by mouth once daily  BP today: 128/80 Prior BP: 124/80 (04/30/2010)  Labs Reviewed: K+: 4.1 (11/07/2010) Creat: : 0.7 (11/07/2010)   Chol: 166 (11/07/2010)   HDL: 55.20 (11/07/2010)   LDL: 102 (11/07/2010)   TG: 44.0 (11/07/2010)  Complete Medication List: 1)  Multivitamins Tabs (Multiple vitamin) .Marland Kitchen.. 1 daily by mouth 2)  Aspirin 81 Mg Tabs (Aspirin) .... One daily as needed 3)  Atenolol 50 Mg Tabs (Atenolol) .Marland Kitchen.. 1 by mouth once daily 4)  Ibuprofen 200 Mg Tabs (Ibuprofen) .... Otc as directed. 5)  Aleve 220 Mg Tabs (Naproxen sodium) .... Otc as directed.  Patient Instructions: 1)  please pull chart and put on my desk to look for thyroid ultrasound 2)  talk to your spouse about birth control - perhaps vasectomy or tubal ligation  3)  (I like [facility])  4)  join weight watchers and starte exercise)    Orders Added: 1)  Est. Patient 18-39 years [99395] 2)  Radiology Referral [Radiology]  Current Allergies (reviewed today): No known allergies

## 2010-11-21 ENCOUNTER — Other Ambulatory Visit: Payer: BC Managed Care – PPO

## 2010-11-25 ENCOUNTER — Ambulatory Visit
Admission: RE | Admit: 2010-11-25 | Discharge: 2010-11-25 | Disposition: A | Discharge status: Home / Self Care | Payer: BC Managed Care – PPO | Source: Ambulatory Visit | Attending: Family Medicine | Admitting: Family Medicine

## 2010-11-25 DIAGNOSIS — E041 Nontoxic single thyroid nodule: Secondary | ICD-10-CM

## 2010-11-26 NOTE — Letter (Signed)
Summary: Results Follow up Letter  [place] [facility]  [address] [place], [place] [address]   Phone: [phone]  Fax: [phone]    11/20/2010 MRN: [id number]  [facility] CARVER [address] [place], [place]  [address]  [place]  Dear Ms. CARVER,  The following are the results of your recent test(s):  Test         Result    Pap Smear:        Normal __X___  Not Normal _____ Comments: ______________________________________________________ Cholesterol: LDL(Bad cholesterol):         Your goal is less than:         HDL (Good cholesterol):       Your goal is more than: Comments:  ______________________________________________________ Mammogram:        Normal _____  Not Normal _____ Comments:  ___________________________________________________________________ Hemoccult:        Normal _____  Not normal _______ Comments:    _____________________________________________________________________ Other Tests:    We routinely do not discuss normal results over the telephone.  If you desire a copy of the results, or you have any questions about this information we can discuss them at your next office visit.   Sincerely,    Sharilyn Sites for Dr. Roxy Manns

## 2010-11-26 NOTE — Miscellaneous (Signed)
Summary: PAP to flowsheet  Clinical Lists Changes  Observations: Added new observation of PAP SMEAR: normal (11/11/2010 8:17)      Preventive Care Screening  Pap Smear:    Date:  11/11/2010    Results:  normal

## 2010-12-02 NOTE — Progress Notes (Signed)
Summary: regarding ultrasound   Phone Note Call from Patient Call back at Work Phone [phone]   Caller: Patient Call For: Judith Part MD Summary of Call: Patient was crying and upset regarding her ultrasound. She says that she recived a call from our office saying that it was really important that she make this appt tomorrow. She says that she wasn't given an option for date or time and it is scheduled for tomorrow and that is not a good time for her. She is asking if you feel it is necessary that she have it done right away or could she put it off for a little while. Please advise.  Initial call taken by: Melody Comas,  November 20, 2010 9:14 AM  Follow-up for Phone Call        it is not urgent - I think she can pick another time  Follow-up by: Judith Part MD,  November 20, 2010 9:49 AM  Additional Follow-up for Phone Call Additional follow up Details #1::        Aram Beecham will call pt.Lewanda Rife LPN  November 20, 2010 9:58 AM  Called pt, left message and she never called back..I called [facility] and appt was rescheduledto Feb 21th at 8:15am.[facility]  November 21, 2010 3:05 PM  Additional Follow-up by: Daine Gip,  November 21, 2010 3:05 PM    Additional Follow-up for Phone Call Additional follow up Details #2::    Called [facility], pt had resch her U/S.Marland KitchenDaine Gip  November 24, 2010 8:52 AM  Follow-up by: Daine Gip,  November 24, 2010 8:52 AM

## 2011-09-23 ENCOUNTER — Other Ambulatory Visit: Payer: Self-pay | Admitting: Internal Medicine

## 2011-09-23 NOTE — Telephone Encounter (Signed)
Patient called and stated that Dr. Meryl Crutch at [facility] Rx her Vivactil 10mg  and wanted to know if you could write her a script for this. She hasn't seen him since 2008.  Please advise.  It helps with her headaches and anxiety.

## 2011-09-23 NOTE — Telephone Encounter (Signed)
Left message at home# and v/m on cell # for pt to call back.

## 2011-09-23 NOTE — Telephone Encounter (Signed)
I need to talk to her about it first and review headaches/ examine her - so please f/u Thanks

## 2011-09-24 NOTE — Telephone Encounter (Signed)
Patient notified as instructed by telephone. Pt said she was in car and will call back for appt.

## 2011-09-30 ENCOUNTER — Telehealth: Payer: Self-pay | Admitting: Internal Medicine

## 2011-09-30 MED ORDER — ATENOLOL 50 MG PO TABS: 50.0000 mg | ORAL_TABLET | Freq: Every day | ORAL | Status: DC

## 2011-09-30 NOTE — Telephone Encounter (Signed)
Px written for call in  -- I do not know what pharmacy to send to

## 2011-09-30 NOTE — Telephone Encounter (Signed)
Patient called and stated she is out of her Atenolol and needs a refill.  I can't find it in her chart.  Please advise.

## 2011-09-30 NOTE — Telephone Encounter (Signed)
Addended by: Roxy Manns A on: 09/30/2011 03:41 PM   Modules accepted: Orders

## 2011-09-30 NOTE — Telephone Encounter (Signed)
Spoke with pt to verify mg and how taking med that I got from [employer]. Pt had last CPX 09/11/11 and pt will ck schedule and call back for CPX appt. Medication phoned to [facility] pharmacy as instructed.

## 2011-10-07 ENCOUNTER — Ambulatory Visit (INDEPENDENT_AMBULATORY_CARE_PROVIDER_SITE_OTHER): Payer: Managed Care, Other (non HMO) | Admitting: Family Medicine

## 2011-10-07 ENCOUNTER — Encounter: Payer: Self-pay | Admitting: Family Medicine

## 2011-10-07 VITALS — BP 130/82 | HR 72 | Temp 98.6°F | Ht 67.0 in | Wt 246.5 lb

## 2011-10-07 DIAGNOSIS — F329 Major depressive disorder, single episode, unspecified: Secondary | ICD-10-CM

## 2011-10-07 MED ORDER — PROTRIPTYLINE HCL 10 MG PO TABS: 10.0000 mg | ORAL_TABLET | Freq: Every day | ORAL | Status: DC

## 2011-10-07 NOTE — Patient Instructions (Signed)
Go ahead and start the vivactil 1/2 pill at bedtime for 1-2 weeks until you get used to it - then advance to 10 mg  If you get more depressed or any intolerable side effects - stop it and let me know  Try to get some exercise  We will do counseling referral at check out  I sent px to your pharmacy Follow up in 6-8 weeks
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
## 2011-11-04 ENCOUNTER — Ambulatory Visit: Payer: Managed Care, Other (non HMO) | Admitting: Psychology

## 2011-11-18 ENCOUNTER — Ambulatory Visit: Payer: Managed Care, Other (non HMO) | Admitting: Family Medicine

## 2012-03-23 ENCOUNTER — Ambulatory Visit (INDEPENDENT_AMBULATORY_CARE_PROVIDER_SITE_OTHER): Payer: 59 | Admitting: Family Medicine

## 2012-03-23 ENCOUNTER — Encounter: Payer: Self-pay | Admitting: Family Medicine

## 2012-03-23 VITALS — BP 122/64 | HR 76 | Temp 98.0°F | Wt 227.2 lb

## 2012-03-23 DIAGNOSIS — Z309 Encounter for contraceptive management, unspecified: Secondary | ICD-10-CM

## 2012-03-23 DIAGNOSIS — Z113 Encounter for screening for infections with a predominantly sexual mode of transmission: Secondary | ICD-10-CM | POA: Insufficient documentation

## 2012-03-23 MED ORDER — NORETHINDRONE ACET-ETHINYL EST 1-20 MG-MCG PO TABS: 1.0000 | ORAL_TABLET | Freq: Every day | ORAL | Status: DC

## 2012-03-23 NOTE — Assessment & Plan Note (Signed)
Pt had unprotected intercourse/ no symptoms/ took the morning after pill as well  The other partner has no problems or known stds Will check gon/ chlam/ hiv/ rpr today Will update if any symptoms( disc poss of HSV) And will schedule pap/ PE when able with me or her gyn

## 2012-03-23 NOTE — Assessment & Plan Note (Signed)
Will start loestrin 1/20 daily  Disc in detail way to take - and poss side eff Will update if problems Aware to use condoms for std prev  Will f/u here or with gyn for annual exam and pap as well  May consider BTL-thinking about it

## 2012-03-23 NOTE — Progress Notes (Signed)
Subjective:    Patient ID: Alexa Anderson, female    DOB: [birth year], 41 y.o.   MRN: [id number]  HPI Here to get checked for STDs- had unprotected sex and is concerned , also to disc contraception Is not on birth control -- May 31 Took the morning after pill the next day- did induce bleeding and also menses was week before  Period due this weekend   No symptoms at all    She left her husband in November- still separated and living with her son    Wt is down 19 lb - has been working on that   Wants to get started with OC Worried about wt gain with it - had wt gain in the past with depo provera Wonders if low dose pill would work out better  No problems in the past  Knows to use condoms for std prev Is not a smoker at all No hx of blood clots in the past   Last pap 2/12- ok  Will either come back here for pap to go to Dr Rana Snare May consider tubal ligation also   Patient Active Problem List  Diagnosis  . GOITER, UNSPECIFIED  . OBESITY  . DEPRESSION  . COMMON MIGRAINE  . HYPERTENSION, BENIGN ESSENTIAL   Past Medical History  Diagnosis Date  . Goiter   . Obesity   . Hypertension   . Migraines   . History of depression   . Weight loss     use to wiegh over 400 lbs   Past Surgical History  Procedure Date  . Wisdom tooth extraction   . Cesarean section   . Thyroid ultrasound 02/12    stable 5 mm nodule hypoechoic on left   History  Substance Use Topics  . Smoking status: Never Smoker   . Smokeless tobacco: Not on file  . Alcohol Use: Yes     Occasional   Family History  Problem Relation Age of Onset  . Cancer Father     renal cell  . Parkinsonism Father    No Known Allergies Current Outpatient Prescriptions on File Prior to Visit  Medication Sig Dispense Refill  . aspirin 81 MG tablet Take 160 mg by mouth as needed.        Marland Kitchen atenolol (TENORMIN) 50 MG tablet Take 1 tablet (50 mg total) by mouth daily.  30 tablet  3  . ibuprofen (ADVIL,MOTRIN) 200 MG  tablet Take 200 mg by mouth as needed.        . Multiple Vitamin (MULTIVITAMIN) tablet Take 1 tablet by mouth daily.        . naproxen sodium (ANAPROX) 220 MG tablet Take 220 mg by mouth as needed.        . protriptyline (VIVACTIL) 10 MG tablet Take 1 tablet (10 mg total) by mouth at bedtime.  30 tablet  11      Review of Systems  Review of Systems  Constitutional: Negative for fever, appetite change, fatigue and unexpected weight change.  Eyes: Negative for pain and visual disturbance.  Respiratory: Negative for cough and shortness of breath.   Cardiovascular: Negative for cp or palpitations    Gastrointestinal: Negative for nausea, diarrhea and constipation.  Genitourinary: Negative for urgency and frequency.  Skin: Negative for pallor or rash   Neurological: Negative for weakness, light-headedness, numbness and headaches.  Hematological: Negative for adenopathy. Does not bruise/bleed easily.  Psychiatric/Behavioral: Negative for dysphoric mood. The patient is not nervous/anxious.  Objective:   Physical Exam  Constitutional: She appears well-developed and well-nourished.  HENT:  Head: Normocephalic and atraumatic.  Eyes: Conjunctivae and EOM are normal. Pupils are equal, round, and reactive to light. No scleral icterus.  Neck: Normal range of motion. Neck supple. No thyromegaly present.  Cardiovascular: Normal rate, regular rhythm and intact distal pulses.   Pulmonary/Chest: Effort normal and breath sounds normal. No respiratory distress. She has no wheezes.  Abdominal: Soft. Bowel sounds are normal. She exhibits no distension and no mass. There is no tenderness.       No suprapubic tenderness    Musculoskeletal: Normal range of motion. She exhibits no edema and no tenderness.  Lymphadenopathy:    She has no cervical adenopathy.  Neurological: She is alert. She has normal reflexes. No cranial nerve deficit. She exhibits normal muscle tone. Coordination normal.  Skin:  Skin is warm and dry. No rash noted. No erythema. No pallor.  Psychiatric: She has a normal mood and affect.          Assessment & Plan:

## 2012-03-23 NOTE — Patient Instructions (Addendum)
Labs today  Start the OC the first Sunday after your period starts (if your period starts on Sunday- start it that day)  Don't smoke  Use condoms for STD prevention as well  If any side effects or problems let me know

## 2012-03-24 LAB — GC/CHLAMYDIA PROBE AMP, URINE
Chlamydia, Swab/Urine, PCR: NEGATIVE
GC Probe Amp, Urine: NEGATIVE

## 2012-03-24 LAB — RPR

## 2012-03-24 LAB — HIV ANTIBODY (ROUTINE TESTING W REFLEX): HIV: NONREACTIVE

## 2012-08-04 ENCOUNTER — Other Ambulatory Visit: Payer: Self-pay | Admitting: Family Medicine

## 2012-10-10 ENCOUNTER — Ambulatory Visit (INDEPENDENT_AMBULATORY_CARE_PROVIDER_SITE_OTHER): Payer: BC Managed Care – PPO | Admitting: Family Medicine

## 2012-10-10 ENCOUNTER — Encounter: Payer: Self-pay | Admitting: Family Medicine

## 2012-10-10 VITALS — BP 138/96 | HR 66 | Temp 98.5°F | Ht 67.0 in | Wt 205.8 lb

## 2012-10-10 DIAGNOSIS — Z309 Encounter for contraceptive management, unspecified: Secondary | ICD-10-CM

## 2012-10-10 DIAGNOSIS — Z113 Encounter for screening for infections with a predominantly sexual mode of transmission: Secondary | ICD-10-CM
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
## 2012-10-10 NOTE — Patient Instructions (Addendum)
Labs today  If any symptoms develop, let me know  Schedule PE/ gyn exam in the next 2 months (any 30 min visit) -- am fasting appt, will do labs that day

## 2012-10-10 NOTE — Progress Notes (Signed)
Subjective:    Patient ID: Alexa Anderson, female    DOB: Apr 06, 1971, 42 y.o.   MRN: [id number]  HPI Here to discuss contraception management   Has been over 6 months since starting loestrin 1/20  She broke up with her partner  He cheated on her - and she wants to go forward with some STD testing   No gyn symptoms at all No cramping or discharge or lesions or itching  Did change her razor - got some razor burn and that went away   Is doing well with menses - pretty light for the most part and not painful   bp is 138/96- forgot her pill - tenormin today  Has lost 22 lb with bmi of 32- then gained 5 lb back Was very stressed in her relationship   Needs to schedule pap  Patient Active Problem List  Diagnosis  . GOITER, UNSPECIFIED  . OBESITY  . DEPRESSION  . COMMON MIGRAINE  . HYPERTENSION, BENIGN ESSENTIAL  . Screen for STD (sexually transmitted disease)  . Contraception management   Past Medical History  Diagnosis Date  . Goiter   . Obesity   . Hypertension   . Migraines   . History of depression   . Weight loss     use to wiegh over 400 lbs   Past Surgical History  Procedure Date  . Wisdom tooth extraction   . Cesarean section   . Thyroid ultrasound 02/12    stable 5 mm nodule hypoechoic on left   History  Substance Use Topics  . Smoking status: Never Smoker   . Smokeless tobacco: Not on file  . Alcohol Use: Yes     Comment: Occasional   Family History  Problem Relation Age of Onset  . Cancer Father     renal cell  . Parkinsonism Father    No Known Allergies Current Outpatient Prescriptions on File Prior to Visit  Medication Sig Dispense Refill  . aspirin 81 MG tablet Take 160 mg by mouth as needed.        Marland Kitchen atenolol (TENORMIN) 50 MG tablet TAKE ONE TABLET BY MOUTH ONE TIME DAILY  30 tablet  1  . ibuprofen (ADVIL,MOTRIN) 200 MG tablet Take 200 mg by mouth as needed.        . naproxen sodium (ANAPROX) 220 MG tablet Take 220 mg by mouth as needed.         . norethindrone-ethinyl estradiol (MICROGESTIN,JUNEL,LOESTRIN) 1-20 MG-MCG tablet Take 1 tablet by mouth daily.  1 Package  11      Review of Systems Review of Systems  Constitutional: Negative for fever, appetite change, fatigue and unexpected weight change.  Eyes: Negative for pain and visual disturbance.  Respiratory: Negative for cough and shortness of breath.   Cardiovascular: Negative for cp or palpitations    Gastrointestinal: Negative for nausea, diarrhea and constipation.  Genitourinary: Negative for urgency and frequency.  Skin: Negative for pallor or rash   Neurological: Negative for weakness, light-headedness, numbness and headaches.  Hematological: Negative for adenopathy. Does not bruise/bleed easily.  Psychiatric/Behavioral: Negative for dysphoric mood. The patient is not nervous/anxious.         Objective:   Physical Exam  Constitutional: She appears well-developed and well-nourished. No distress.       obese and well appearing   HENT:  Head: Atraumatic.  Mouth/Throat: Oropharynx is clear and moist.  Eyes: Conjunctivae normal and EOM are normal. Pupils are equal, round, and reactive to light. No  scleral icterus.  Neck: Normal range of motion. Neck supple. No thyromegaly present.  Cardiovascular: Normal rate and regular rhythm.   Pulmonary/Chest: Effort normal and breath sounds normal.  Abdominal: Soft. Bowel sounds are normal. She exhibits no distension and no mass. There is no tenderness.       No suprapubic tenderness or fullness    Lymphadenopathy:    She has no cervical adenopathy.  Neurological: She is alert.  Skin: Skin is warm and dry. No rash noted. No erythema. No pallor.  Psychiatric: She has a normal mood and affect.          Assessment & Plan:

## 2012-10-10 NOTE — Assessment & Plan Note (Signed)
Doing well on current OC Adv to use condoms for STD protection Scheduled annual gyn visi

## 2012-10-11 LAB — SYPHILIS: RPR W/REFLEX TO RPR TITER AND TREPONEMAL ANTIBODIES, TRADITIONAL SCREENING AND DIAGNOSIS ALGORITHM

## 2012-10-11 LAB — HEPATITIS PANEL, ACUTE
HCV Ab: NEGATIVE
Hep A IgM: NEGATIVE
Hep B C IgM: NEGATIVE
Hepatitis B Surface Ag: NEGATIVE

## 2012-10-11 LAB — GC/CHLAMYDIA PROBE AMP, URINE

## 2012-10-11 LAB — HIV ANTIBODY (ROUTINE TESTING W REFLEX): HIV: NONREACTIVE

## 2012-10-14 LAB — GC/CHLAMYDIA PROBE AMP
CT Probe RNA: NEGATIVE
GC Probe RNA: NEGATIVE

## 2012-12-09 ENCOUNTER — Encounter: Payer: BC Managed Care – PPO | Admitting: Family Medicine

## 2013-02-20 ENCOUNTER — Other Ambulatory Visit: Payer: Self-pay | Admitting: *Deleted

## 2013-02-20 MED ORDER — NORETHINDRONE ACET-ETHINYL EST 1-20 MG-MCG PO TABS: 1.0000 | ORAL_TABLET | Freq: Every day | ORAL | Status: DC

## 2013-04-17 ENCOUNTER — Encounter: Payer: BC Managed Care – PPO | Admitting: Family Medicine

## 2013-09-05 ENCOUNTER — Encounter: Payer: Self-pay | Admitting: Family Medicine

## 2013-09-05 ENCOUNTER — Ambulatory Visit (INDEPENDENT_AMBULATORY_CARE_PROVIDER_SITE_OTHER): Payer: BC Managed Care – PPO | Admitting: Family Medicine

## 2013-09-05 ENCOUNTER — Other Ambulatory Visit (HOSPITAL_COMMUNITY)
Admission: RE | Admit: 2013-09-05 | Discharge: 2013-09-05 | Disposition: A | Discharge status: Home / Self Care | Payer: BC Managed Care – PPO | Source: Ambulatory Visit | Attending: Family Medicine | Admitting: Family Medicine

## 2013-09-05 VITALS — BP 122/88 | HR 72 | Temp 98.7°F | Ht 66.25 in | Wt 225.8 lb

## 2013-09-05 DIAGNOSIS — Z0000 Encounter for general adult medical examination without abnormal findings: Secondary | ICD-10-CM | POA: Insufficient documentation

## 2013-09-05 DIAGNOSIS — Z23 Encounter for immunization: Secondary | ICD-10-CM

## 2013-09-05 DIAGNOSIS — F430 Acute stress reaction: Secondary | ICD-10-CM

## 2013-09-05 DIAGNOSIS — E049 Nontoxic goiter, unspecified: Secondary | ICD-10-CM

## 2013-09-05 DIAGNOSIS — Z01419 Encounter for gynecological examination (general) (routine) without abnormal findings: Secondary | ICD-10-CM

## 2013-09-05 DIAGNOSIS — Z0001 Encounter for general adult medical examination with abnormal findings: Secondary | ICD-10-CM | POA: Insufficient documentation

## 2013-09-05 DIAGNOSIS — I10 Essential (primary) hypertension: Secondary | ICD-10-CM

## 2013-09-05 DIAGNOSIS — Z309 Encounter for contraceptive management, unspecified: Secondary | ICD-10-CM

## 2013-09-05 DIAGNOSIS — E669 Obesity, unspecified: Secondary | ICD-10-CM

## 2013-09-05 LAB — CBC WITH DIFFERENTIAL/PLATELET
Basophils Absolute: 0 10*3/uL (ref 0.0–0.1)
Basophils Relative: 0.3 % (ref 0.0–3.0)
Eosinophils Absolute: 0.1 10*3/uL (ref 0.0–0.7)
Eosinophils Relative: 1 % (ref 0.0–5.0)
HCT: 39.9 % (ref 36.0–46.0)
Hemoglobin: 13.6 g/dL (ref 12.0–15.0)
Lymphocytes Relative: 26.2 % (ref 12.0–46.0)
Lymphs Abs: 1.7 10*3/uL (ref 0.7–4.0)
MCHC: 34.2 g/dL (ref 30.0–36.0)
MCV: 87.2 fl (ref 78.0–100.0)
Monocytes Absolute: 0.5 10*3/uL (ref 0.1–1.0)
Monocytes Relative: 7 % (ref 3.0–12.0)
Neutro Abs: 4.3 10*3/uL (ref 1.4–7.7)
Neutrophils Relative %: 65.5 % (ref 43.0–77.0)
Platelets: 239 10*3/uL (ref 150.0–400.0)
RBC: 4.58 Mil/uL (ref 3.87–5.11)
RDW: 13.7 % (ref 11.5–14.6)
WBC: 6.5 10*3/uL (ref 4.5–10.5)

## 2013-09-05 LAB — COMPREHENSIVE METABOLIC PANEL
ALT: 19 U/L (ref 0–35)
AST: 18 U/L (ref 0–37)
Albumin: 3.9 g/dL (ref 3.5–5.2)
Alkaline Phosphatase: 53 U/L (ref 39–117)
BUN: 13 mg/dL (ref 6–23)
CO2: 26 mEq/L (ref 19–32)
Calcium: 9.3 mg/dL (ref 8.4–10.5)
Chloride: 104 mEq/L (ref 96–112)
Creatinine, Ser: 0.7 mg/dL (ref 0.4–1.2)
GFR: 92.7 mL/min (ref >60.00)
Glucose, Bld: 93 mg/dL (ref 70–99)
Potassium: 4.1 mEq/L (ref 3.5–5.1)
Sodium: 137 mEq/L (ref 135–145)
Total Bilirubin: 0.7 mg/dL (ref 0.3–1.2)
Total Protein: 7.3 g/dL (ref 6.0–8.3)

## 2013-09-05 LAB — HEMOGLOBIN A1C: Hgb A1c MFr Bld: 5.3 % (ref 4.6–6.5)

## 2013-09-05 LAB — TSH: TSH: 1.42 u[IU]/mL (ref 0.35–5.50)

## 2013-09-05 MED ORDER — NORETHINDRONE ACET-ETHINYL EST 1-20 MG-MCG PO TABS: 1.0000 | ORAL_TABLET | Freq: Every day | ORAL | Status: DC

## 2013-09-05 NOTE — Progress Notes (Signed)
Pre-visit discussion using our clinic review tool. No additional management support is needed unless otherwise documented below in the visit note.  

## 2013-09-05 NOTE — Patient Instructions (Signed)
Tetanus shot (Tdap) today  We will refer you to gyn at check out for discussion of contraception  Don't forget to make your first mammogram appointment  If you are interested in counseling at any time -please let me know  Work on healthy diet and exercise

## 2013-09-05 NOTE — Progress Notes (Signed)
Subjective:    Patient ID: Alexa Anderson, female    DOB: [birth year], 42 y.o.   MRN: [id number]  HPI  Here for health maintenance exam and to review chronic medical problems  Feeling good overall    Has an ingrown hair to look at in the pubic area   Wt is up 20 lb with bmi of 36 Not happy with that - was doing some comfort eating  Now she is taking line dancing and shag classes  Health habits - affected by her work schedule  She wants to go to the gym at night   Lots of stressors and she has wrestled with mood  Has never fully delt with her divorce -and that is difficult - not a lot of support  Very difficult for her    Td- has not had in 10 years - will update Tdap toda Flu vaccine -declines this   Pap 2/12 nl  Needs pap  Menses--normal and ok  OC -no problems with it , but is interested in BTL or other sterilization  Mammogram- has not started getting those  Self breast exam- no lumps or changes   bp is stable today  No cp or palpitations or headaches or edema  No side effects to medicines  BP Readings from Last 3 Encounters:  09/05/13 122/88  10/10/12 138/96  03/23/12 122/64    Has not taken bp med in over a year  Does not check outside office   Due for labs today   Hx of goiter-she has not noticed anything new or any enlargement Lab Results  Component Value Date   TSH 1.77 11/07/2010    Patient Active Problem List   Diagnosis Date Noted  . Routine general medical examination at a health care facility 09/05/2013  . Encounter for routine gynecological examination 09/05/2013  . Stress reaction 09/05/2013  . Screen for STD (sexually transmitted disease) 03/23/2012  . Contraception management 03/23/2012  . GOITER, UNSPECIFIED 06/05/2008  . OBESITY 06/04/2008  . DEPRESSION 06/04/2008  . COMMON MIGRAINE 06/04/2008  . HYPERTENSION, BENIGN ESSENTIAL 06/04/2008   Past Medical History  Diagnosis Date  . Goiter   . Obesity   . Hypertension   . Migraines     . History of depression   . Weight loss     use to wiegh over 400 lbs   Past Surgical History  Procedure Laterality Date  . Wisdom tooth extraction    . Cesarean section    . Thyroid ultrasound  02/12    stable 5 mm nodule hypoechoic on left   History  Substance Use Topics  . Smoking status: Never Smoker   . Smokeless tobacco: Not on file  . Alcohol Use: Yes     Comment: Occasional   Family History  Problem Relation Age of Onset  . Cancer Father     renal cell  . Parkinsonism Father    No Known Allergies Current Outpatient Prescriptions on File Prior to Visit  Medication Sig Dispense Refill  . aspirin 81 MG tablet Take 160 mg by mouth as needed.        Marland Kitchen ibuprofen (ADVIL,MOTRIN) 200 MG tablet Take 200 mg by mouth as needed.         No current facility-administered medications on file prior to visit.    Review of Systems Review of Systems  Constitutional: Negative for fever, appetite change,  and unexpected weight change. pos for fatigue  Eyes: Negative for pain and visual disturbance.  Respiratory: Negative for cough and shortness of breath.   Cardiovascular: Negative for cp or palpitations    Gastrointestinal: Negative for nausea, diarrhea and constipation.  Genitourinary: Negative for urgency and frequency.  Skin: Negative for pallor or rash   Neurological: Negative for weakness, light-headedness, numbness and headaches.  Hematological: Negative for adenopathy. Does not bruise/bleed easily.  Psychiatric/Behavioral: Negative for dysphoric mood. The patient is not nervous/anxious.pos for significant stressors / neg for depression          Objective:   Physical Exam  Constitutional: She appears well-developed and well-nourished. No distress.  obese and well appearing   HENT:  Head: Normocephalic and atraumatic.  Right Ear: External ear normal.  Left Ear: External ear normal.  Nose: Nose normal.  Mouth/Throat: Oropharynx is clear and moist.  Eyes: Conjunctivae  and EOM are normal. Pupils are equal, round, and reactive to light. Right eye exhibits no discharge. Left eye exhibits no discharge. No scleral icterus.  Neck: Normal range of motion. Neck supple. No JVD present. Thyromegaly present.  Baseline goiter  Cardiovascular: Normal rate, regular rhythm, normal heart sounds and intact distal pulses.  Exam reveals no gallop.   Pulmonary/Chest: Effort normal and breath sounds normal. No respiratory distress. She has no wheezes. She has no rales.  Abdominal: Soft. Bowel sounds are normal. She exhibits no distension and no mass. There is no tenderness.  Genitourinary: Vagina normal and uterus normal. No breast swelling, tenderness, discharge or bleeding. There is no rash, tenderness or lesion on the right labia. There is no rash, tenderness or lesion on the left labia. Uterus is not enlarged and not tender. Cervix exhibits no motion tenderness, no discharge and no friability. Right adnexum displays no mass, no tenderness and no fullness. Left adnexum displays no mass, no tenderness and no fullness. No bleeding around the vagina. No vaginal discharge found.  Breast exam: No mass, nodules, thickening, tenderness, bulging, retraction, inflamation, nipple discharge or skin changes noted.  No axillary or clavicular LA.  Chaperoned exam.   See skin exam re: ingrown hair  Musculoskeletal: She exhibits no edema and no tenderness.  Lymphadenopathy:    She has no cervical adenopathy.  Neurological: She is alert. She has normal reflexes. No cranial nerve deficit. She exhibits normal muscle tone. Coordination normal.  Skin: Skin is warm and dry. No rash noted. No erythema. No pallor.  Small area of skin debridement s/p removal of ingrown hair on R labia minora  Does not appear to be infected   Tanned appearing   Psychiatric: She has a normal mood and affect.          Assessment & Plan:

## 2013-09-06 NOTE — Assessment & Plan Note (Signed)
BP: 122/88 mmHg   bp in fair control at this time  No changes needed Disc lifstyle change with low sodium diet and exercise

## 2013-09-06 NOTE — Assessment & Plan Note (Signed)
Reviewed health habits including diet and exercise and skin cancer prevention Reviewed appropriate screening tests for age  Also reviewed health mt list, fam hx and immunization status , as well as social and family history   See HPI Lab today 

## 2013-09-06 NOTE — Assessment & Plan Note (Signed)
Pt is interested in disc opt for sterilization  Ref to GYN Considering IUD

## 2013-09-06 NOTE — Assessment & Plan Note (Signed)
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

## 2013-09-06 NOTE — Assessment & Plan Note (Signed)
Unchanged on exam Lab today No clinical symptoms

## 2013-09-06 NOTE — Assessment & Plan Note (Signed)
Discussed how this problem influences overall health and the risks it imposes  Reviewed plan for weight loss with lower calorie diet (via better food choices and also portion control or program like weight watchers) and exercise building up to or more than 30 minutes 5 days per week including some aerobic activity   Pt aware she needs to work on emotional eating issue

## 2013-09-06 NOTE — Assessment & Plan Note (Signed)
Exam and pap done Current OC renewed  Adv to use small amt of abx oint on ingrown hair area and update

## 2013-09-07 ENCOUNTER — Encounter: Payer: Self-pay | Admitting: *Deleted

## 2013-09-13 ENCOUNTER — Encounter: Payer: Self-pay | Admitting: *Deleted

## 2013-09-19 ENCOUNTER — Encounter: Payer: BC Managed Care – PPO | Admitting: Obstetrics & Gynecology

## 2013-10-11 ENCOUNTER — Encounter: Payer: BC Managed Care – PPO | Admitting: Obstetrics & Gynecology

## 2014-07-31 ENCOUNTER — Telehealth: Payer: Self-pay

## 2014-07-31 MED ORDER — NORETHINDRONE ACET-ETHINYL EST 1-20 MG-MCG PO TABS: 1.0000 | ORAL_TABLET | Freq: Every day | ORAL | Status: DC

## 2014-07-31 NOTE — Telephone Encounter (Signed)
Pt last seen 09/05/2013; pt relocated to [facility] and has not established with new doctor there; pt request 2-3 months refill on Gildess to [facility]. Pt said if necessary she will schedule f/u with Dr Glori Bickers but cannot come to [facility] now. Please advise.

## 2014-07-31 NOTE — Telephone Encounter (Signed)
Please refill for 3 months to give her time to find a doctor there Thanks

## 2014-07-31 NOTE — Telephone Encounter (Signed)
Rx sent and pt notified to est care with PCP in [place] or scheduled a f/u appt with Dr. Glori Bickers

## 2016-05-12 ENCOUNTER — Ambulatory Visit (INDEPENDENT_AMBULATORY_CARE_PROVIDER_SITE_OTHER): Payer: BLUE CROSS/BLUE SHIELD | Admitting: Family Medicine

## 2016-05-12 ENCOUNTER — Encounter: Payer: Self-pay | Admitting: Family Medicine

## 2016-05-12 ENCOUNTER — Other Ambulatory Visit (HOSPITAL_COMMUNITY)
Admission: RE | Admit: 2016-05-12 | Discharge: 2016-05-12 | Disposition: A | Discharge status: Home / Self Care | Payer: BLUE CROSS/BLUE SHIELD | Source: Ambulatory Visit | Attending: Family Medicine | Admitting: Family Medicine

## 2016-05-12 VITALS — BP 132/80 | HR 71 | Temp 98.2°F | Ht 67.5 in | Wt 228.2 lb

## 2016-05-12 DIAGNOSIS — Z0000 Encounter for general adult medical examination without abnormal findings: Secondary | ICD-10-CM

## 2016-05-12 DIAGNOSIS — I10 Essential (primary) hypertension: Secondary | ICD-10-CM

## 2016-05-12 DIAGNOSIS — Z1151 Encounter for screening for human papillomavirus (HPV): Secondary | ICD-10-CM | POA: Diagnosis not present

## 2016-05-12 DIAGNOSIS — Z01419 Encounter for gynecological examination (general) (routine) without abnormal findings: Secondary | ICD-10-CM | POA: Diagnosis not present

## 2016-05-12 DIAGNOSIS — E669 Obesity, unspecified: Secondary | ICD-10-CM | POA: Diagnosis not present

## 2016-05-12 DIAGNOSIS — Z1231 Encounter for screening mammogram for malignant neoplasm of breast: Secondary | ICD-10-CM | POA: Insufficient documentation

## 2016-05-12 LAB — COMPREHENSIVE METABOLIC PANEL
ALT: 15 U/L (ref 0–35)
AST: 16 U/L (ref 0–37)
Albumin: 4.3 g/dL (ref 3.5–5.2)
Alkaline Phosphatase: 61 U/L (ref 39–117)
BUN: 9 mg/dL (ref 6–23)
CO2: 26 mEq/L (ref 19–32)
Calcium: 9.5 mg/dL (ref 8.4–10.5)
Chloride: 102 mEq/L (ref 96–112)
Creatinine, Ser: 0.63 mg/dL (ref 0.40–1.20)
GFR: 108.52 mL/min (ref >60.00)
Glucose, Bld: 90 mg/dL (ref 70–99)
Potassium: 3.8 mEq/L (ref 3.5–5.1)
Sodium: 137 mEq/L (ref 135–145)
Total Bilirubin: 0.8 mg/dL (ref 0.2–1.2)
Total Protein: 7.3 g/dL (ref 6.0–8.3)

## 2016-05-12 LAB — CBC WITH DIFFERENTIAL/PLATELET
Basophils Absolute: 0 10*3/uL (ref 0.0–0.1)
Basophils Relative: 0.4 % (ref 0.0–3.0)
Eosinophils Absolute: 0 10*3/uL (ref 0.0–0.7)
Eosinophils Relative: 0.5 % (ref 0.0–5.0)
HCT: 38.8 % (ref 36.0–46.0)
Hemoglobin: 13.3 g/dL (ref 12.0–15.0)
Lymphocytes Relative: 25.1 % (ref 12.0–46.0)
Lymphs Abs: 1.7 10*3/uL (ref 0.7–4.0)
MCHC: 34.1 g/dL (ref 30.0–36.0)
MCV: 86.4 fl (ref 78.0–100.0)
Monocytes Absolute: 0.5 10*3/uL (ref 0.1–1.0)
Monocytes Relative: 6.5 % (ref 3.0–12.0)
Neutro Abs: 4.7 10*3/uL (ref 1.4–7.7)
Neutrophils Relative %: 67.5 % (ref 43.0–77.0)
Platelets: 233 10*3/uL (ref 150.0–400.0)
RBC: 4.49 Mil/uL (ref 3.87–5.11)
RDW: 14.4 % (ref 11.5–15.5)
WBC: 6.9 10*3/uL (ref 4.0–10.5)

## 2016-05-12 LAB — LIPID PANEL
Cholesterol: 163 mg/dL (ref 0–200)
HDL: 61.8 mg/dL (ref >39.00)
LDL Cholesterol: 93 mg/dL (ref 0–99)
NonHDL: 101.11
Total CHOL/HDL Ratio: 3
Triglycerides: 42 mg/dL (ref 0.0–149.0)
VLDL: 8.4 mg/dL (ref 0.0–40.0)

## 2016-05-12 LAB — HM PAP SMEAR: HM Pap smear: NORMAL

## 2016-05-12 LAB — TSH: TSH: 1.74 u[IU]/mL (ref 0.35–4.50)

## 2016-05-12 MED ORDER — NORETHINDRONE ACET-ETHINYL EST 1-20 MG-MCG PO TABS: 1.0000 | ORAL_TABLET | Freq: Every day | ORAL | 11 refills | Status: DC

## 2016-05-12 NOTE — Progress Notes (Signed)
Pre visit review using our clinic review tool, if applicable. No additional management support is needed unless otherwise documented below in the visit note. 

## 2016-05-12 NOTE — Assessment & Plan Note (Signed)
bp in fair control at this time  BP Readings from Last 1 Encounters:  05/12/16 132/80   No changes needed Disc lifstyle change with low sodium diet and exercise

## 2016-05-12 NOTE — Assessment & Plan Note (Signed)
Pap and exam done Will start back on OC Long discussion re: way to take OC properly and avoidance of smoking  Risks of blood clots outlined as well as possible side eff Pt aware that this does not prevent stds and condoms should still be used inst that it may take up to 3 months for menses to fall into rhythm or side eff to stop  Adv to call if problems or questions

## 2016-05-12 NOTE — Assessment & Plan Note (Signed)
Reviewed health habits including diet and exercise and skin cancer prevention Reviewed appropriate screening tests for age  Also reviewed health mt list, fam hx and immunization status , as well as social and family history   See hPI Labs drawn Gyn exam done Stop at check out for referral for mammogram  Labs today Get a flu shot in the fall  Get back to weight watchers Start an exercise program when you are ready to  Start the loestrin the first Sunday after your period starts (if your period starts on Sunday then start it that day)

## 2016-05-12 NOTE — Assessment & Plan Note (Signed)
Discussed how this problem influences overall health and the risks it imposes  Reviewed plan for weight loss with lower calorie diet (via better food choices and also portion control or program like weight watchers) and exercise building up to or more than 30 minutes 5 days per week including some aerobic activity   Commended on wt loss so far  

## 2016-05-12 NOTE — Assessment & Plan Note (Signed)
Scheduled annual screening mammogram Nl breast exam today  Encouraged monthly self exams   

## 2016-05-12 NOTE — Progress Notes (Signed)
Subjective:    Patient ID: Alexa Anderson, female    DOB: 07-Apr-1971, 45 y.o.   MRN: [id number]  HPI Here for health maintenance exam and to review chronic medical problems    Moved to [place] for a while and just moved back  Working for a radio station  Was ready to come back - a big change  Just got an apt - a little anxiety over the change   Mammogram - has not been getting mammograms yet  Wishes to start - [place]  Self breast exam - no lumps or changes   Flu shots- does not get   Pap 12/14-negative  Due for a pap  Wants to get back on loestrin 1-20 -did very well on it (was on it in the past)  Does not need std testing  Periods have been more irregular   Td 12/14  HIV screen 1/14-neg  Wt Readings from Last 3 Encounters:  05/12/16 228 lb 4 oz (103.5 kg)  09/05/13 225 lb 12 oz (102.4 kg)  10/10/12 205 lb 12 oz (93.3 kg)   bmi 35-obese range  Not enough exercise due to move - getting ready to start Joined weight watchers - lost 20 lb since dec - will get back on track with it / meetings   Due for fasting labs   Patient Active Problem List   Diagnosis Date Noted  . Encounter for screening mammogram for breast cancer 05/12/2016  . Routine general medical examination at a health care facility 09/05/2013  . Encounter for routine gynecological examination 09/05/2013  . Stress reaction 09/05/2013  . Screen for STD (sexually transmitted disease) 03/23/2012  . Contraception management 03/23/2012  . GOITER, UNSPECIFIED 06/05/2008  . OBESITY 06/04/2008  . DEPRESSION 06/04/2008  . COMMON MIGRAINE 06/04/2008  . HYPERTENSION, BENIGN ESSENTIAL 06/04/2008   Past Medical History:  Diagnosis Date  . Goiter   . History of depression   . Hypertension   . Migraines   . Obesity   . Weight loss    use to wiegh over 400 lbs   Past Surgical History:  Procedure Laterality Date  . CESAREAN SECTION    . thyroid ultrasound  02/12   stable 5 mm nodule hypoechoic on left  . WISDOM  TOOTH EXTRACTION     Social History  Substance Use Topics  . Smoking status: Never Smoker  . Smokeless tobacco: Never Used  . Alcohol use Yes     Comment: Occasional   Family History  Problem Relation Age of Onset  . Cancer Father     renal cell  . Parkinsonism Father    Allergies  Allergen Reactions  . Bee Venom    Current Outpatient Prescriptions on File Prior to Visit  Medication Sig Dispense Refill  . aspirin 81 MG tablet Take 160 mg by mouth as needed.      Marland Kitchen ibuprofen (ADVIL,MOTRIN) 200 MG tablet Take 200 mg by mouth as needed.       No current facility-administered medications on file prior to visit.     Review of Systems Review of Systems  Constitutional: Negative for fever, appetite change, fatigue and unexpected weight change.  Eyes: Negative for pain and visual disturbance.  Respiratory: Negative for cough and shortness of breath.   Cardiovascular: Negative for cp or palpitations    Gastrointestinal: Negative for nausea, diarrhea and constipation.  Genitourinary: Negative for urgency and frequency.  Skin: Negative for pallor or rash   Neurological: Negative for weakness, light-headedness,  numbness and headaches.  Hematological: Negative for adenopathy. Does not bruise/bleed easily.  Psychiatric/Behavioral: Negative for dysphoric mood. Pos for stressors and anxiety       Objective:   Physical Exam  Constitutional: She appears well-developed and well-nourished. No distress.  obese and well appearing   HENT:  Head: Normocephalic and atraumatic.  Right Ear: External ear normal.  Left Ear: External ear normal.  Mouth/Throat: Oropharynx is clear and moist.  Eyes: Conjunctivae and EOM are normal. Pupils are equal, round, and reactive to light. No scleral icterus.  Neck: Normal range of motion. Neck supple. No JVD present. Carotid bruit is not present. Thyromegaly present.  Stable goiter Symmetric and non tender   Cardiovascular: Normal rate, regular rhythm,  normal heart sounds and intact distal pulses.  Exam reveals no gallop.   Pulmonary/Chest: Effort normal and breath sounds normal. No respiratory distress. She has no wheezes. She exhibits no tenderness.  Abdominal: Soft. Bowel sounds are normal. She exhibits no distension, no abdominal bruit and no mass. There is no tenderness.  Genitourinary: No breast swelling, tenderness, discharge or bleeding.  Genitourinary Comments:         Anus appears normal w/o hemorrhoids or masses     External genitalia : nl appearance and hair distribution/no lesions     Urethral meatus : nl size, no lesions or prolapse     Urethra: no masses, tenderness or scarring    Bladder : no masses or tenderness     Vagina: nl general appearance, no discharge or  Lesions, no significant cystocele  or rectocele     Cervix: no lesions/ discharge or friability    Uterus: nl size, contour, position, and mobility (not fixed) , non tender    Adnexa : no masses, tenderness, enlargement or nodularity       Breast exam: No mass, nodules, thickening, tenderness, bulging, retraction, inflamation, nipple discharge or skin changes noted.  No axillary or clavicular LA.      Musculoskeletal: Normal range of motion. She exhibits no edema or tenderness.  Lymphadenopathy:    She has no cervical adenopathy.  Neurological: She is alert. She has normal reflexes. No cranial nerve deficit. She exhibits normal muscle tone. Coordination normal.  Skin: Skin is warm and dry. No rash noted. No erythema. No pallor.  Solar lentigines diffusely and brown nevi  Psychiatric: She has a normal mood and affect.          Assessment & Plan:   Problem List Items Addressed This Visit      Cardiovascular and Mediastinum   HYPERTENSION, BENIGN ESSENTIAL - Primary    bp in fair control at this time  BP Readings from Last 1 Encounters:  05/12/16 132/80   No changes needed Disc lifstyle change with low sodium diet and exercise            Other   Routine general medical examination at a health care facility    Reviewed health habits including diet and exercise and skin cancer prevention Reviewed appropriate screening tests for age  Also reviewed health mt list, fam hx and immunization status , as well as social and family history   See hPI Labs drawn Gyn exam done Stop at check out for referral for mammogram  Labs today Get a flu shot in the fall  Get back to weight watchers Start an exercise program when you are ready to  Start the loestrin the first Sunday after your period starts (if your period starts on Sunday  then start it that day)      Relevant Orders   CBC with Differential/Platelet (Completed)   Comprehensive metabolic panel (Completed)   TSH (Completed)   Lipid panel (Completed)   OBESITY    Discussed how this problem influences overall health and the risks it imposes  Reviewed plan for weight loss with lower calorie diet (via better food choices and also portion control or program like weight watchers) and exercise building up to or more than 30 minutes 5 days per week including some aerobic activity   Commended on wt loss so far      Encounter for screening mammogram for breast cancer    Scheduled annual screening mammogram Nl breast exam today  Encouraged monthly self exams        Relevant Orders   MM DIGITAL SCREENING BILATERAL   Encounter for routine gynecological examination    Pap and exam done Will start back on OC Long discussion re: way to take OC properly and avoidance of smoking  Risks of blood clots outlined as well as possible side eff Pt aware that this does not prevent stds and condoms should still be used inst that it may take up to 3 months for menses to fall into rhythm or side eff to stop  Adv to call if problems or questions        Relevant Orders   Cytology - PAP    Other Visit Diagnoses   None.

## 2016-05-12 NOTE — Patient Instructions (Addendum)
Stop at check out for referral for mammogram  Labs today Get a flu shot in the fall  Get back to weight watchers Start an exercise program when you are ready to  Start the loestrin the first Sunday after your period starts (if your period starts on Sunday then start it that day)

## 2016-05-13 ENCOUNTER — Encounter: Payer: Self-pay | Admitting: *Deleted

## 2016-05-13 LAB — CYTOLOGY - PAP

## 2016-05-15 ENCOUNTER — Encounter: Payer: Self-pay | Admitting: *Deleted

## 2016-06-02 ENCOUNTER — Ambulatory Visit
Admission: RE | Admit: 2016-06-02 | Discharge: 2016-06-02 | Disposition: A | Discharge status: Home / Self Care | Payer: BLUE CROSS/BLUE SHIELD | Source: Ambulatory Visit | Attending: Family Medicine | Admitting: Family Medicine

## 2016-06-02 DIAGNOSIS — Z1231 Encounter for screening mammogram for malignant neoplasm of breast: Secondary | ICD-10-CM | POA: Diagnosis not present

## 2016-06-03 ENCOUNTER — Other Ambulatory Visit: Payer: Self-pay | Admitting: Family Medicine

## 2016-06-03 DIAGNOSIS — R928 Other abnormal and inconclusive findings on diagnostic imaging of breast: Secondary | ICD-10-CM

## 2016-06-11 ENCOUNTER — Ambulatory Visit
Admission: RE | Admit: 2016-06-11 | Discharge: 2016-06-11 | Disposition: A | Discharge status: Home / Self Care | Payer: BLUE CROSS/BLUE SHIELD | Source: Ambulatory Visit | Attending: Family Medicine | Admitting: Family Medicine

## 2016-06-11 DIAGNOSIS — R928 Other abnormal and inconclusive findings on diagnostic imaging of breast: Secondary | ICD-10-CM

## 2016-06-11 LAB — HM MAMMOGRAPHY

## 2016-10-15 ENCOUNTER — Telehealth: Payer: Self-pay

## 2016-10-15 NOTE — Telephone Encounter (Addendum)
[facility] said the microgestin is the generic of her Gildess and that's the closest Rx we would probably get to her Rx  Called pt and no answer so left voicemail letting her know we can either send the generic microgestin to her pharmacy or she can call around to see if there is another pharmacy that still has the [place], and I requested pt to call the office back to let us know what she wants [place] to do

## 2016-10-15 NOTE — Telephone Encounter (Signed)
?   If there is a direct sub Please ask her pharmacist what the closest other OC is and we can px it  If it is on back order- nothing we can really do

## 2016-10-15 NOTE — Telephone Encounter (Signed)
Pt said she has been getting Junel instead of Gildess. Pt wants Gildess again. AJ at [facility] advised Jaci Lazier is on long term back order. Pt said Junel caused h/a and pt could not sleep. Pt wants to get Gildess at [facility] on [address]. I spoke with April at [facility] [address] and they do not have gildess; [facility] has equivalent Microgestin 1/20. Pt said she wants Gildess; I asked pt if she wanted to call pharmacies to see if had Gildess and pt said no she does not have time to do that. Pt wants to know what Dr Glori Bickers would suggest. [facility] [address].

## 2016-10-16 MED ORDER — NORETHINDRONE ACET-ETHINYL EST 1-20 MG-MCG PO TABS: 1.0000 | ORAL_TABLET | Freq: Every day | ORAL | 11 refills | Status: DC

## 2016-10-16 NOTE — Telephone Encounter (Signed)
Pt notified Rx sent to pharmacy

## 2016-10-16 NOTE — Telephone Encounter (Signed)
Patient called back and said she'd like to try the generic.  It can be called in to [facility]..  Patient is hoping to pick it up today, so she can start it on Sunday.

## 2016-10-16 NOTE — Telephone Encounter (Signed)
I sent in generic microgestin

## 2017-04-08 ENCOUNTER — Other Ambulatory Visit: Payer: Self-pay | Admitting: Family Medicine

## 2017-10-19 ENCOUNTER — Other Ambulatory Visit: Payer: Self-pay | Admitting: Family Medicine

## 2017-10-19 NOTE — Telephone Encounter (Signed)
Pt hasn't been seen in over a year and no future appts., please advise  

## 2017-10-19 NOTE — Telephone Encounter (Signed)
Please schedule PE and refill until then  

## 2017-10-22 NOTE — Telephone Encounter (Signed)
appt scheduled and med refilled 

## 2017-11-29 ENCOUNTER — Encounter: Payer: Self-pay | Admitting: Family Medicine

## 2017-12-14 ENCOUNTER — Ambulatory Visit (INDEPENDENT_AMBULATORY_CARE_PROVIDER_SITE_OTHER): Payer: BLUE CROSS/BLUE SHIELD | Admitting: Family Medicine

## 2017-12-14 ENCOUNTER — Encounter: Payer: Self-pay | Admitting: Family Medicine

## 2017-12-14 VITALS — BP 126/80 | HR 69 | Temp 98.8°F | Ht 67.0 in | Wt 253.5 lb

## 2017-12-14 DIAGNOSIS — I10 Essential (primary) hypertension: Secondary | ICD-10-CM

## 2017-12-14 DIAGNOSIS — Z0000 Encounter for general adult medical examination without abnormal findings: Secondary | ICD-10-CM | POA: Diagnosis not present

## 2017-12-14 DIAGNOSIS — Z1231 Encounter for screening mammogram for malignant neoplasm of breast: Secondary | ICD-10-CM

## 2017-12-14 DIAGNOSIS — E049 Nontoxic goiter, unspecified: Secondary | ICD-10-CM

## 2017-12-14 DIAGNOSIS — G43009 Migraine without aura, not intractable, without status migrainosus: Secondary | ICD-10-CM | POA: Diagnosis not present

## 2017-12-14 MED ORDER — DROSPIRENONE-ETHINYL ESTRADIOL 3-0.03 MG PO TABS: 1.0000 | ORAL_TABLET | Freq: Every day | ORAL | 11 refills | Status: DC

## 2017-12-14 NOTE — Assessment & Plan Note (Signed)
No change on exam today  TSH today No clinical changes for pt

## 2017-12-14 NOTE — Assessment & Plan Note (Signed)
Scheduled annual screening mammogram Nl breast exam today  Encouraged monthly self exams   

## 2017-12-14 NOTE — Assessment & Plan Note (Signed)
More HA early in menses Will try change to yasmin for OC and update

## 2017-12-14 NOTE — Patient Instructions (Addendum)
Try to get most of your carbohydrates from produce (with the exception of white potatoes)  Eat less bread/pasta/rice/snack foods/cereals/sweets and other items from the middle of the grocery store (processed carbs)  We will refer you to nutritionist for help with weight   Let's change pill to generic yasmin - let me know if you have any problems   Labs today    Pick exercise that works best with your schedule - work up to 30 minutes per day

## 2017-12-14 NOTE — Assessment & Plan Note (Signed)
bp in fair control at this time  BP Readings from Last 1 Encounters:  12/14/17 126/80   No changes needed (no medications ) Disc lifstyle change with low sodium diet and exercise  Wt loss enc

## 2017-12-14 NOTE — Assessment & Plan Note (Signed)
Discussed how this problem influences overall health and the risks it imposes  Reviewed plan for weight loss with lower calorie diet (via better food choices and also portion control or program like weight watchers) and exercise building up to or more than 30 minutes 5 days per week including some aerobic activity    Pt desires nutrition referral for help with wt loss  Will do this Plan made for exercise - am or lunch time can fit it in  Plans to get outdoors more

## 2017-12-14 NOTE — Assessment & Plan Note (Addendum)
Reviewed health habits including diet and exercise and skin cancer prevention Reviewed appropriate screening tests for age  Also reviewed health mt list, fam hx and immunization status , as well as social and family history    Labs drawn today  Change OC to Yasmin  Ref to nutritionist to help with morbid obesity  bp is stable  Declines flu shot

## 2017-12-14 NOTE — Progress Notes (Signed)
Subjective:    Patient ID: Alexa Anderson, female    DOB: November 12, 1970, 47 y.o.   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   Mammogram 9/17 neg , wants a referral  Self breast exam - no lumps or changes   Flu shot-declines   Pap 8/17 neg with neg HPV screen  No worries about STD screening  Menses - gets a very bad headache the day before menses comes on  junel 1/20-would like to switch  Periods tend to last 4 days /not heavy    Tetanus shot 12/14  HIV screen neg 1/14  bp is stable today  No cp or palpitations or headaches or edema  No medications / ok on OC  BP Readings from Last 3 Encounters:  12/14/17 126/80  05/12/16 132/80  09/05/13 122/88      Due for labs Lab Results  Component Value Date   CHOL 163 05/12/2016   HDL 61.80 05/12/2016   LDLCALC 93 05/12/2016   TRIG 42.0 05/12/2016   CHOLHDL 3 05/12/2016   Diet has changed for the worse  Has free food a lot at work  She gets very hungry with menses  Lack of exercise   Patient Active Problem List   Diagnosis Date Noted  . Encounter for screening mammogram for breast cancer 05/12/2016  . Routine general medical examination at a health care facility 09/05/2013  . Encounter for routine gynecological examination 09/05/2013  . Contraception management 03/23/2012  . Goiter 06/05/2008  . Morbid obesity ([place]) 06/04/2008  . H/O: depression 06/04/2008  . Migraine without aura 06/04/2008  . HYPERTENSION, BENIGN ESSENTIAL  06/04/2008   Past Medical History:  Diagnosis Date  . Goiter   . History of depression   . Hypertension   . Migraines   . Obesity   . Weight loss    use to wiegh over 400 lbs   Past Surgical History:  Procedure Laterality Date  . CESAREAN SECTION    . thyroid ultrasound  02/12   stable 5 mm nodule hypoechoic on left  . WISDOM TOOTH EXTRACTION     Social History   Tobacco Use  . Smoking status: Never Smoker  . Smokeless tobacco: Never Used  Substance Use Topics  . Alcohol use: Yes    Comment: Occasional  . Drug use: No   Family History  Problem Relation Age of Onset  . Cancer Father        renal cell  . Parkinsonism Father    Allergies  Allergen Reactions  . Bee Venom    Current Outpatient Medications on File Prior to Visit  Medication Sig Dispense Refill  . aspirin 81 MG tablet Take 160 mg by mouth as needed.      . diphenhydrAMINE (BENADRYL) 25 MG tablet Take 25 mg by mouth every 8 (eight) hours as needed.    Marland Kitchen ibuprofen (ADVIL,MOTRIN) 200 MG tablet Take  200 mg by mouth as needed.       No current facility-administered medications on file prior to visit.      Review of Systems  Constitutional: Positive for fatigue. Negative for activity change, appetite change, fever and unexpected weight change.  HENT: Negative for congestion, ear pain, rhinorrhea, sinus pressure and sore throat.   Eyes: Negative for pain, redness and visual disturbance.  Respiratory: Negative for cough, shortness of breath and wheezing.   Cardiovascular: Negative for chest pain and palpitations.  Gastrointestinal: Negative for abdominal pain, blood in stool, constipation and diarrhea.  Endocrine: Negative for polydipsia and polyuria.  Genitourinary: Negative for dysuria, frequency and urgency.  Musculoskeletal: Negative for arthralgias, back pain and myalgias.  Skin: Negative for pallor and rash.  Allergic/Immunologic: Negative for environmental allergies.  Neurological: Negative for  dizziness, syncope and headaches.  Hematological: Negative for adenopathy. Does not bruise/bleed easily.  Psychiatric/Behavioral: Negative for decreased concentration and dysphoric mood. The patient is not nervous/anxious.        Objective:   Physical Exam  Constitutional: She appears well-developed and well-nourished. No distress.  obese and well appearing   HENT:  Head: Normocephalic and atraumatic.  Right Ear: External ear normal.  Left Ear: External ear normal.  Mouth/Throat: Oropharynx is clear and moist.  Eyes: Conjunctivae and EOM are normal. Pupils are equal, round, and reactive to light. No scleral icterus.  Neck: Normal range of motion. Neck supple. No JVD present. Carotid bruit is not present. No thyromegaly present.  Cardiovascular: Normal rate, regular rhythm, normal heart sounds and intact distal pulses. Exam reveals no gallop.  Pulmonary/Chest: Effort normal and breath sounds normal. No respiratory distress. She has no wheezes. She exhibits no tenderness.  Abdominal: Soft. Bowel sounds are normal. She exhibits no distension, no abdominal bruit and no mass. There is no tenderness.  Genitourinary: No breast swelling, tenderness, discharge or bleeding.  Genitourinary Comments: Breast exam: No mass, nodules, thickening, tenderness, bulging, retraction, inflamation, nipple discharge or skin changes noted.  No axillary or clavicular LA.      Musculoskeletal: Normal range of motion. She exhibits no edema or tenderness.  Lymphadenopathy:    She has no cervical adenopathy.  Neurological: She is alert. She has normal reflexes. No cranial nerve deficit. She exhibits normal muscle tone. Coordination normal.  Skin: Skin is warm and dry. No rash noted. No erythema. No pallor.  Solar lentigines diffusely   Psychiatric: She has a normal mood and affect.          Assessment & Plan:   Problem List Items Addressed This Visit      Cardiovascular and Mediastinum   HYPERTENSION,  BENIGN ESSENTIAL    bp in fair control at this time  BP Readings from Last 1 Encounters:  12/14/17 126/80   No changes needed (no medications ) Disc lifstyle change with low sodium diet and exercise  Wt loss enc        Migraine without aura    More HA early in menses Will try change to yasmin for OC and update         Endocrine   Goiter    No change on exam today  TSH today No clinical changes for pt         Other   Encounter for screening mammogram for breast cancer    Scheduled annual screening mammogram Nl breast exam today  Encouraged monthly self exams        Relevant Orders   MM DIGITAL  SCREENING BILATERAL   Morbid obesity ([place])    Discussed how this problem influences overall health and the risks it imposes  Reviewed plan for weight loss with lower calorie diet (via better food choices and also portion control or program like weight watchers) and exercise building up to or more than 30 minutes 5 days per week including some aerobic activity    Pt desires nutrition referral for help with wt loss  Will do this Plan made for exercise - am or lunch time can fit it in  Plans to get outdoors more       Relevant Orders   Amb ref to Medical Nutrition Therapy-MNT   Routine general medical examination at a health care facility - Primary    Reviewed health habits including diet and exercise and skin cancer prevention Reviewed appropriate screening tests for age  Also reviewed health mt list, fam hx and immunization status , as well as social and family history    Labs drawn today  Change OC to Yasmin  Ref to nutritionist to help with morbid obesity  bp is stable  Declines flu shot        Relevant Orders   CBC with Differential/Platelet   Comprehensive metabolic panel   Lipid panel   TSH

## 2017-12-15 LAB — CBC WITH DIFFERENTIAL/PLATELET
Basophils Absolute: 0.1 10*3/uL (ref 0.0–0.1)
Basophils Relative: 0.9 % (ref 0.0–3.0)
Eosinophils Absolute: 0.1 10*3/uL (ref 0.0–0.7)
Eosinophils Relative: 0.9 % (ref 0.0–5.0)
HCT: 40.3 % (ref 36.0–46.0)
Hemoglobin: 13.6 g/dL (ref 12.0–15.0)
Lymphocytes Relative: 28.5 % (ref 12.0–46.0)
Lymphs Abs: 2.3 10*3/uL (ref 0.7–4.0)
MCHC: 33.7 g/dL (ref 30.0–36.0)
MCV: 87.5 fl (ref 78.0–100.0)
Monocytes Absolute: 0.6 10*3/uL (ref 0.1–1.0)
Monocytes Relative: 7 % (ref 3.0–12.0)
Neutro Abs: 5 10*3/uL (ref 1.4–7.7)
Neutrophils Relative %: 62.7 % (ref 43.0–77.0)
Platelets: 276 10*3/uL (ref 150.0–400.0)
RBC: 4.61 Mil/uL (ref 3.87–5.11)
RDW: 13.8 % (ref 11.5–15.5)
WBC: 8 10*3/uL (ref 4.0–10.5)

## 2017-12-15 LAB — COMPREHENSIVE METABOLIC PANEL
ALT: 16 U/L (ref 0–35)
AST: 14 U/L (ref 0–37)
Albumin: 4.3 g/dL (ref 3.5–5.2)
Alkaline Phosphatase: 74 U/L (ref 39–117)
BUN: 9 mg/dL (ref 6–23)
CO2: 27 mEq/L (ref 19–32)
Calcium: 9.5 mg/dL (ref 8.4–10.5)
Chloride: 101 mEq/L (ref 96–112)
Creatinine, Ser: 0.68 mg/dL (ref 0.40–1.20)
GFR: 98.67 mL/min (ref >60.00)
Glucose, Bld: 92 mg/dL (ref 70–99)
Potassium: 3.8 mEq/L (ref 3.5–5.1)
Sodium: 136 mEq/L (ref 135–145)
Total Bilirubin: 0.5 mg/dL (ref 0.2–1.2)
Total Protein: 7.5 g/dL (ref 6.0–8.3)

## 2017-12-15 LAB — LIPID PANEL
Cholesterol: 180 mg/dL (ref 0–200)
HDL: 54.3 mg/dL (ref >39.00)
LDL Cholesterol: 109 mg/dL — ABNORMAL HIGH (ref 0–99)
NonHDL: 125.99
Total CHOL/HDL Ratio: 3
Triglycerides: 86 mg/dL (ref 0.0–149.0)
VLDL: 17.2 mg/dL (ref 0.0–40.0)

## 2017-12-15 LAB — TSH: TSH: 2 u[IU]/mL (ref 0.35–4.50)

## 2017-12-16 ENCOUNTER — Encounter: Payer: Self-pay | Admitting: *Deleted

## 2017-12-29 ENCOUNTER — Ambulatory Visit: Payer: BLUE CROSS/BLUE SHIELD | Admitting: Skilled Nursing Facility1

## 2018-01-26 DIAGNOSIS — Z713 Dietary counseling and surveillance: Secondary | ICD-10-CM | POA: Diagnosis not present

## 2018-02-08 DIAGNOSIS — Z713 Dietary counseling and surveillance: Secondary | ICD-10-CM | POA: Diagnosis not present

## 2018-02-15 DIAGNOSIS — F331 Major depressive disorder, recurrent, moderate: Secondary | ICD-10-CM | POA: Diagnosis not present

## 2018-02-16 DIAGNOSIS — Z713 Dietary counseling and surveillance: Secondary | ICD-10-CM | POA: Diagnosis not present

## 2018-02-21 DIAGNOSIS — F331 Major depressive disorder, recurrent, moderate: Secondary | ICD-10-CM | POA: Diagnosis not present

## 2018-03-01 DIAGNOSIS — F331 Major depressive disorder, recurrent, moderate: Secondary | ICD-10-CM | POA: Diagnosis not present

## 2018-03-08 DIAGNOSIS — F331 Major depressive disorder, recurrent, moderate: Secondary | ICD-10-CM | POA: Diagnosis not present

## 2018-08-02 DIAGNOSIS — F331 Major depressive disorder, recurrent, moderate: Secondary | ICD-10-CM | POA: Diagnosis not present

## 2018-08-03 ENCOUNTER — Ambulatory Visit: Payer: BLUE CROSS/BLUE SHIELD | Admitting: Family Medicine

## 2018-08-03 ENCOUNTER — Encounter: Payer: Self-pay | Admitting: Family Medicine

## 2018-08-03 VITALS — BP 124/82 | HR 77 | Temp 98.5°F | Ht 67.0 in | Wt 265.2 lb

## 2018-08-03 DIAGNOSIS — E049 Nontoxic goiter, unspecified: Secondary | ICD-10-CM

## 2018-08-03 DIAGNOSIS — Z113 Encounter for screening for infections with a predominantly sexual mode of transmission: Secondary | ICD-10-CM | POA: Diagnosis not present

## 2018-08-03 DIAGNOSIS — F430 Acute stress reaction: Secondary | ICD-10-CM | POA: Diagnosis not present

## 2018-08-03 DIAGNOSIS — I10 Essential (primary) hypertension: Secondary | ICD-10-CM

## 2018-08-03 DIAGNOSIS — Z3041 Encounter for surveillance of contraceptive pills: Secondary | ICD-10-CM

## 2018-08-03 LAB — T4, FREE: Free T4: 0.83 ng/dL (ref 0.60–1.60)

## 2018-08-03 LAB — TSH: TSH: 1.59 u[IU]/mL (ref 0.35–4.50)
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
## 2018-08-04 ENCOUNTER — Encounter: Payer: Self-pay | Admitting: *Deleted

## 2018-08-04 LAB — FLUORESCENT TREPONEMAL AB(FTA)-IGG-BLD: Fluorescent Treponemal ABS: NONREACTIVE

## 2018-08-04 LAB — HIV ANTIBODY (ROUTINE TESTING W REFLEX): HIV 1&2 Ab, 4th Generation: NONREACTIVE

## 2018-08-04 LAB — RPR: RPR Ser Ql: REACTIVE — AB

## 2018-08-04 LAB — C. TRACHOMATIS/N. GONORRHOEAE RNA
C. trachomatis RNA, TMA: NOT DETECTED
N. gonorrhoeae RNA, TMA: NOT DETECTED

## 2018-08-04 LAB — RPR TITER: RPR Titer: 1:1 {titer} — ABNORMAL HIGH

## 2018-08-05 ENCOUNTER — Telehealth: Payer: Self-pay | Admitting: *Deleted

## 2018-08-05 NOTE — Telephone Encounter (Signed)
Left VM requesting pt to call the office back regarding her labs 

## 2018-08-06 NOTE — Assessment & Plan Note (Signed)
Discussed how this problem influences overall health and the risks it imposes  Reviewed plan for weight loss with lower calorie diet (via better food choices and also portion control or program like weight watchers) and exercise building up to or more than 30 minutes 5 days per week including some aerobic activity   Stress may be making harder to loose wt  Also pt thinks this OC plays a role  Disc tracking calories

## 2018-08-06 NOTE — Assessment & Plan Note (Signed)
Not currently sexually active Also thinks that her OC is causing wt gain Will hold it
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
## 2018-08-06 NOTE — Assessment & Plan Note (Signed)
Thyroid labs today  No change in exam

## 2018-08-06 NOTE — Assessment & Plan Note (Signed)
bp in fair control at this time  BP Readings from Last 1 Encounters:  08/03/18 124/82   No changes needed Most recent labs reviewed  Disc lifstyle change with low sodium diet and exercise

## 2018-08-06 NOTE — Assessment & Plan Note (Signed)
With recent break up  No known exposures or symptoms  HIV/rpr/gon/chlaymdia testing today

## 2018-08-17 ENCOUNTER — Encounter: Payer: Self-pay | Admitting: Family Medicine

## 2018-08-17 ENCOUNTER — Ambulatory Visit: Payer: BLUE CROSS/BLUE SHIELD | Admitting: Family Medicine

## 2018-08-17 VITALS — BP 146/78 | HR 83 | Temp 98.1°F | Ht 67.0 in | Wt 270.2 lb

## 2018-08-17 DIAGNOSIS — J019 Acute sinusitis, unspecified: Secondary | ICD-10-CM | POA: Insufficient documentation

## 2018-08-17 DIAGNOSIS — J0110 Acute frontal sinusitis, unspecified: Secondary | ICD-10-CM

## 2018-08-17 MED ORDER — AMOXICILLIN-POT CLAVULANATE 875-125 MG PO TABS: 1.0000 | ORAL_TABLET | Freq: Two times a day (BID) | ORAL | 0 refills | Status: DC

## 2018-08-17 MED ORDER — PROMETHAZINE-DM 6.25-15 MG/5ML PO SYRP: 5.0000 mL | ORAL_SOLUTION | Freq: Every evening | ORAL | 0 refills | Status: DC | PRN

## 2018-08-17 MED ORDER — BENZONATATE 200 MG PO CAPS
200.0000 mg | ORAL_CAPSULE | Freq: Three times a day (TID) | ORAL | 1 refills | Status: DC | PRN
Start: 2018-08-17 — End: 2018-10-24

## 2018-08-17 NOTE — Patient Instructions (Addendum)
I think you have a sinus infection  Rest Fluids  Salt water gargle  Also saline nasal spray for congestion   flonase nasal spray over the counter may also be helpful  Take augmentin as directed   Try tessalon and prometh DM for cough   Ibuprofen over the counter is good for congestion and pain as well   Update if not starting to improve in a week or if worsening

## 2018-08-17 NOTE — Progress Notes (Signed)
Subjective:    Patient ID: Alexa Anderson, female    DOB: November 08, 1970, 47 y.o.   MRN: [id number]  HPI Here for uri sympotms   Started 8d ago  ST first Then cough and congestion  Prod cough- yellow d/c    Felt a little better and then worse again   This am- ST worse  Drowning in mucous pnd Pressure and pain in sinuses  Yellow drainage   Worse lying down    Gargles salt water  [place] [employer] quil [place] quil   Patient Active Problem List   Diagnosis Date Noted  . Acute sinusitis 08/17/2018  . Encounter for screening mammogram for breast cancer 05/12/2016  . Routine general medical examination at a health care facility 09/05/2013  . Encounter for routine gynecological examination 09/05/2013  . Stress reaction 09/05/2013  . Screen for STD (sexually transmitted disease) 03/23/2012  . Contraception management 03/23/2012  . Goiter 06/05/2008  . Morbid obesity ([place]) 06/04/2008  . H/O: depression 06/04/2008  . Migraine without aura 06/04/2008  . HYPERTENSION, BENIGN ESSENTIAL 06/04/2008   Past Medical History:  Diagnosis Date  . Goiter   . History of depression   . Hypertension   . Migraines   . Obesity   . Weight loss    use to wiegh over 400 lbs   Past Surgical History:  Procedure Laterality Date  . CESAREAN SECTION    . thyroid ultrasound  02/12   stable 5 mm nodule hypoechoic on left  . WISDOM TOOTH EXTRACTION     Social History   Tobacco Use  . Smoking status: Never Smoker  . Smokeless tobacco: Never Used  Substance Use Topics  . Alcohol use: Yes    Comment: Occasional  . Drug use: No   Family History  Problem Relation Age of Onset  . Cancer Father        renal cell  . Parkinsonism Father    Allergies  Allergen Reactions  . Bee Venom    Current Outpatient Medications on File Prior to Visit  Medication Sig Dispense Refill  . aspirin 81 MG tablet Take 160 mg by mouth as needed.      . diphenhydrAMINE (BENADRYL) 25 MG tablet Take 25 mg by  mouth every 8 (eight) hours as needed.    Marland Kitchen ibuprofen (ADVIL,MOTRIN) 200 MG tablet Take 200 mg by mouth as needed.       No current facility-administered medications on file prior to visit.     Review of Systems  Constitutional: Positive for appetite change. Negative for fatigue and fever.  HENT: Positive for congestion, ear pain, postnasal drip, rhinorrhea, sinus pressure and sore throat. Negative for nosebleeds.   Eyes: Negative for pain, redness and itching.  Respiratory: Positive for cough. Negative for shortness of breath and wheezing.   Cardiovascular: Negative for chest pain.  Gastrointestinal: Negative for abdominal pain, diarrhea, nausea and vomiting.  Endocrine: Negative for polyuria.  Genitourinary: Negative for dysuria, frequency and urgency.  Musculoskeletal: Negative for arthralgias and myalgias.  Allergic/Immunologic: Negative for immunocompromised state.  Neurological: Positive for headaches. Negative for dizziness, tremors, syncope, weakness and numbness.  Hematological: Negative for adenopathy. Does not bruise/bleed easily.  Psychiatric/Behavioral: Negative for dysphoric mood. The patient is not nervous/anxious.        Objective:   Physical Exam  Constitutional: She appears well-developed and well-nourished. No distress.  obese and well appearing   HENT:  Head: Normocephalic and atraumatic.  Right Ear: External ear normal.  Left  Ear: External ear normal.  Mouth/Throat: Oropharynx is clear and moist. No oropharyngeal exudate.  Nares are injected and congested  Bilateral maxillary sinus tenderness  Post nasal drip   Eyes: Pupils are equal, round, and reactive to light. Conjunctivae and EOM are normal. Right eye exhibits no discharge. Left eye exhibits no discharge.  Neck: Normal range of motion. Neck supple. Thyromegaly present.  Cardiovascular: Normal rate and regular rhythm.  Pulmonary/Chest: Effort normal and breath sounds normal. No respiratory distress. She  has no wheezes. She has no rales.  Lymphadenopathy:    She has no cervical adenopathy.  Neurological: She is alert. No cranial nerve deficit.  Skin: Skin is warm and dry. No rash noted.  Psychiatric: She has a normal mood and affect.          Assessment & Plan:   Problem List Items Addressed This Visit      Respiratory   Acute sinusitis - Primary    S/p uri  tx with augmentin  Tessalon and prometh dm (caution of sedation)  Fluids/rest  flonase and nasal saline  Update if not starting to improve in a week or if worsening        Relevant Medications   amoxicillin-clavulanate (AUGMENTIN) 875-125 MG tablet   benzonatate (TESSALON) 200 MG capsule   promethazine-dextromethorphan (PROMETHAZINE-DM) 6.25-15 MG/5ML syrup

## 2018-08-17 NOTE — Assessment & Plan Note (Signed)
S/p uri  tx with augmentin  Tessalon and prometh dm (caution of sedation)  Fluids/rest  flonase and nasal saline  Update if not starting to improve in a week or if worsening

## 2018-09-05 DIAGNOSIS — F331 Major depressive disorder, recurrent, moderate: Secondary | ICD-10-CM | POA: Diagnosis not present

## 2018-09-14 ENCOUNTER — Ambulatory Visit: Payer: BLUE CROSS/BLUE SHIELD | Admitting: Family Medicine

## 2018-09-19 DIAGNOSIS — F331 Major depressive disorder, recurrent, moderate: Secondary | ICD-10-CM | POA: Diagnosis not present

## 2018-09-26 DIAGNOSIS — F331 Major depressive disorder, recurrent, moderate: Secondary | ICD-10-CM | POA: Diagnosis not present

## 2018-10-12 DIAGNOSIS — F331 Major depressive disorder, recurrent, moderate: Secondary | ICD-10-CM | POA: Diagnosis not present

## 2018-10-24 ENCOUNTER — Ambulatory Visit: Payer: BLUE CROSS/BLUE SHIELD | Admitting: Family Medicine

## 2018-10-24 ENCOUNTER — Encounter: Payer: Self-pay | Admitting: Family Medicine

## 2018-10-24 VITALS — BP 122/74 | HR 60 | Temp 98.1°F | Ht 67.0 in | Wt 266.5 lb

## 2018-10-24 DIAGNOSIS — J069 Acute upper respiratory infection, unspecified: Secondary | ICD-10-CM

## 2018-10-24 DIAGNOSIS — H9313 Tinnitus, bilateral: Secondary | ICD-10-CM

## 2018-10-24 DIAGNOSIS — H9319 Tinnitus, unspecified ear: Secondary | ICD-10-CM | POA: Insufficient documentation
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
## 2018-10-24 NOTE — Assessment & Plan Note (Signed)
Reassuring exam No doubt worsening her tinnitus Disc otc care Will add flonase and netti pot Disc symptomatic care - see instructions on AVS  Update if not starting to improve in a week or if worsening  (esp if sinus pain)

## 2018-10-24 NOTE — Patient Instructions (Signed)
I think the tinnitus will gradually get better  Always wear ear protection for loud noise  You also have a cold (or allergies)  netti pot flonase  Whatever works over the counter  Update if not starting to improve in a week or if worsening

## 2018-10-24 NOTE — Assessment & Plan Note (Signed)
Pt is ill today  Disc good nutrition and eventual wt loss effort
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
## 2018-10-26 DIAGNOSIS — F331 Major depressive disorder, recurrent, moderate: Secondary | ICD-10-CM | POA: Diagnosis not present

## 2018-11-02 DIAGNOSIS — Z713 Dietary counseling and surveillance: Secondary | ICD-10-CM | POA: Diagnosis not present

## 2018-11-02 DIAGNOSIS — F331 Major depressive disorder, recurrent, moderate: Secondary | ICD-10-CM | POA: Diagnosis not present

## 2018-11-09 DIAGNOSIS — F331 Major depressive disorder, recurrent, moderate: Secondary | ICD-10-CM | POA: Diagnosis not present

## 2018-11-14 DIAGNOSIS — F331 Major depressive disorder, recurrent, moderate: Secondary | ICD-10-CM | POA: Diagnosis not present

## 2018-12-12 ENCOUNTER — Encounter: Payer: Self-pay | Admitting: Family Medicine

## 2018-12-12 ENCOUNTER — Ambulatory Visit: Payer: BLUE CROSS/BLUE SHIELD | Admitting: Family Medicine

## 2018-12-12 VITALS — BP 141/90 | HR 78 | Temp 98.2°F | Ht 67.0 in | Wt 274.2 lb

## 2018-12-12 DIAGNOSIS — Z113 Encounter for screening for infections with a predominantly sexual mode of transmission: Secondary | ICD-10-CM

## 2018-12-12 DIAGNOSIS — N912 Amenorrhea, unspecified: Secondary | ICD-10-CM | POA: Diagnosis not present

## 2018-12-12 DIAGNOSIS — F430 Acute stress reaction: Secondary | ICD-10-CM

## 2018-12-12 DIAGNOSIS — I10 Essential (primary) hypertension: Secondary | ICD-10-CM

## 2018-12-12 LAB — LUTEINIZING HORMONE: LH: 49.69 m[IU]/mL

## 2018-12-12 LAB — TSH: TSH: 1.29 u[IU]/mL (ref 0.35–4.50)

## 2018-12-12 LAB — FOLLICLE STIMULATING HORMONE: FSH: 48.1 m[IU]/mL
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
## 2018-12-12 NOTE — Assessment & Plan Note (Signed)
Discussed how this problem influences overall health and the risks it imposes  Reviewed plan for weight loss with lower calorie diet (via better food choices and also portion control or program like weight watchers) and exercise building up to or more than 30 minutes 5 days per week including some aerobic activity   Hormone change and stressors are making this more difficult

## 2018-12-12 NOTE — Patient Instructions (Signed)
Labs today for late period  Factors like stress and weight and hormones may all play a role   Blood pressure was improved the 2nd time  Follow up in 2 weeks to check it again  Try to take care of yourself in the meantime   Also STD screening
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
## 2018-12-12 NOTE — Assessment & Plan Note (Signed)
BP: (!) 141/90    Worsened by stress and wt gain I suspect F/u in 2 wk -if still up we will talk about medication

## 2018-12-12 NOTE — Assessment & Plan Note (Signed)
Recent unprotected sex Labs ordered  No symptoms

## 2018-12-12 NOTE — Assessment & Plan Note (Signed)
Skipped this menses /but some spotting  Neg preg tests at home This may be multifactorial Stress/anxiety , perimenopause, weight gain may all play a role Labs drawn incl hCG/PL/TSH/ FSH and LH Pending result may consider initiating progesterone w/d bleed

## 2018-12-13 LAB — HCG, SERUM, QUALITATIVE: Preg, Serum: NEGATIVE

## 2018-12-13 LAB — C. TRACHOMATIS/N. GONORRHOEAE RNA
C. trachomatis RNA, TMA: NOT DETECTED
N. gonorrhoeae RNA, TMA: NOT DETECTED

## 2018-12-13 LAB — HIV ANTIBODY (ROUTINE TESTING W REFLEX): HIV 1&2 Ab, 4th Generation: NONREACTIVE

## 2018-12-13 LAB — PROLACTIN: Prolactin: 8.2 ng/mL

## 2018-12-13 LAB — RPR: RPR Ser Ql: NONREACTIVE

## 2018-12-28 ENCOUNTER — Other Ambulatory Visit: Payer: Self-pay

## 2018-12-28 ENCOUNTER — Ambulatory Visit (INDEPENDENT_AMBULATORY_CARE_PROVIDER_SITE_OTHER): Payer: BLUE CROSS/BLUE SHIELD | Admitting: Family Medicine

## 2018-12-28 ENCOUNTER — Encounter: Payer: Self-pay | Admitting: Family Medicine

## 2018-12-28 DIAGNOSIS — N912 Amenorrhea, unspecified: Secondary | ICD-10-CM | POA: Diagnosis not present

## 2018-12-28 DIAGNOSIS — I10 Essential (primary) hypertension: Secondary | ICD-10-CM

## 2018-12-28 DIAGNOSIS — F430 Acute stress reaction: Secondary | ICD-10-CM

## 2018-12-28 NOTE — Progress Notes (Signed)
Virtual Visit via Telephone Note  I connected with Alexa Anderson on 12/28/18 at  8:00 AM EDT by telephone and verified that I am speaking with the correct person using two identifiers.   I discussed the limitations, risks, security and privacy concerns of performing an evaluation and management service by telephone and the availability of in person appointments. I also discussed with the patient that there may be a patient responsible charge related to this service. The patient expressed understanding and agreed to proceed.   History of Present Illness: Working from home  May be laying off people soon-that is stressful /also reducing pay   Has not had an opportunity   No HA or symptoms of HTN  Sinuses cause some headache in am sometimes /pollen is bad  Runny and stuffy nose   Working a lot  More steps and eating less at home  More sleep   Anxiety is improved with negative std nl tests  Also menopause  Anxiety is work related   No periods  Almost a month since her last spotting  (feels a bit pre menstrual)   Labs showed menopause  Some night sweats- keeping room cool -not overwhelming   She does not think that she gained weight/ clothes are not tight Does not have a scale Eating less/ may be loosing  Eating fruits and veg  Getting out in good weather -yard work and walks  Overall feels healthier than she was   ROS Review of Systems  Constitutional: Negative for fever, appetite change, fatigue and unexpected weight change.  Eyes: Negative for pain and visual disturbance.  Respiratory: Negative for cough and shortness of breath.  pos for allergic rhinorrhea  Cardiovascular: Negative for cp or palpitations    Gastrointestinal: Negative for nausea, diarrhea and constipation.  Genitourinary: Negative for urgency and frequency.  Skin: Negative for pallor or rash   Neurological: Negative for weakness, light-headedness, numbness and headaches.  Hematological: Negative for  adenopathy. Does not bruise/bleed easily.  Psychiatric/Behavioral: Negative for dysphoric mood. The patient is less nervous/anxious.       Patient Active Problem List   Diagnosis Date Noted  . Amenorrhea 12/12/2018  . Tinnitus 10/24/2018  . Encounter for screening mammogram for breast cancer 05/12/2016  . Routine general medical examination at a health care facility 09/05/2013  . Encounter for routine gynecological examination 09/05/2013  . Stress reaction 09/05/2013  . Screen for STD (sexually transmitted disease) 03/23/2012  . Contraception management 03/23/2012  . Goiter 06/05/2008  . Morbid obesity ([place]) 06/04/2008  . H/O: depression 06/04/2008  . Migraine without aura 06/04/2008  . HYPERTENSION, BENIGN ESSENTIAL 06/04/2008   Past Medical History:  Diagnosis Date  . Goiter   . History of depression   . Hypertension   . Migraines   . Obesity   . Weight loss    use to wiegh over 400 lbs   Past Surgical History:  Procedure Laterality Date  . CESAREAN SECTION    . thyroid ultrasound  02/12   stable 5 mm nodule hypoechoic on left  . WISDOM TOOTH EXTRACTION     Social History   Tobacco Use  . Smoking status: Never Smoker  . Smokeless tobacco: Never Used  Substance Use Topics  . Alcohol use: Yes    Comment: Occasional  . Drug use: No   Family History  Problem Relation Age of Onset  . Cancer Father        renal cell  . Parkinsonism Father  Allergies  Allergen Reactions  . Bee Venom    Current Outpatient Medications on File Prior to Visit  Medication Sig Dispense Refill  . aspirin 81 MG tablet Take 160 mg by mouth as needed.      . diphenhydrAMINE (BENADRYL) 25 MG tablet Take 25 mg by mouth every 8 (eight) hours as needed.    Marland Kitchen ibuprofen (ADVIL,MOTRIN) 200 MG tablet Take 200 mg by mouth as needed.       No current facility-administered medications on file prior to visit.     Observations/Objective: Pt sounds much less anxious on the phone  Talkative  and more cheerful   Assessment and Plan: Problem List Items Addressed This Visit      Cardiovascular and Mediastinum   HYPERTENSION, BENIGN ESSENTIAL - Primary    Pt plans to get a bp cuff from home - I suggested OMRON for the arm (pharmacy or on line)  She will start checking bp when relaxed (arm at heart level and both feet on floor)  Asked her to call in bp readings when able  We will treat if/when needed  Enc further better diet/exercise and weight loss as well         Other   Morbid obesity ([place])    Per pt she is working/ more active/ outdoors and eating less Keeping junk food out of house  Feels like she may be loosing- clothes fit ok  Does not have a scale-plans to get one        Stress reaction    Doing much better overall  STD tests neg  In menopause per labs  Some work stress at home but more active overall  Better diet and self care Will continue to follow      Amenorrhea    Reviewed labs [facility] and LH are both in menopause range  She has not had a period or any bleeding  Given weight- we do need to watch for endometrial hyperplasia Asked her to keep [place] updated re: any bleeding           Follow Up Instructions: Get an OMRON bp cuff for arm  Check bp when sitting/ arm at heart level and relaxed Keep up the better health habits Think about a scale if you want to monitor  Take care of yourself  Call us with bp readings when you can     I discussed the assessment and treatment plan with the patient. The patient was provided an opportunity to ask questions and all were answered. The patient agreed with the plan and demonstrated an understanding of the instructions.   The patient was advised to call back or seek an in-person evaluation if the symptoms worsen or if the condition fails to improve as anticipated.  I provided 14 minutes of non-face-to-face time during this encounter.   Loura Pardon, MD

## 2018-12-28 NOTE — Assessment & Plan Note (Signed)
Reviewed labs [facility] and LH are both in menopause range  She has not had a period or any bleeding  Given weight- we do need to watch for endometrial hyperplasia Asked her to keep [place] updated re: any bleeding

## 2018-12-28 NOTE — Assessment & Plan Note (Signed)
Doing much better overall  STD tests neg  In menopause per labs  Some work stress at home but more active overall  Better diet and self care Will continue to follow

## 2018-12-28 NOTE — Patient Instructions (Signed)
Get a blood pressure cuff if you can  OMRON is the brand I like most/for the arm (not the wrist)

## 2018-12-28 NOTE — Assessment & Plan Note (Signed)
Per pt she is working/ more active/ outdoors and eating less Keeping junk food out of house  Feels like she may be loosing- clothes fit ok  Does not have a scale-plans to get one

## 2018-12-28 NOTE — Assessment & Plan Note (Signed)
Pt plans to get a bp cuff from home - I suggested OMRON for the arm (pharmacy or on line)  She will start checking bp when relaxed (arm at heart level and both feet on floor)  Asked her to call in bp readings when able  We will treat if/when needed  Enc further better diet/exercise and weight loss as well

## 2019-01-09 ENCOUNTER — Telehealth: Payer: Self-pay | Admitting: Family Medicine

## 2019-01-09 DIAGNOSIS — I10 Essential (primary) hypertension: Secondary | ICD-10-CM

## 2019-01-09 MED ORDER — INDAPAMIDE 1.25 MG PO TABS: 1.2500 mg | ORAL_TABLET | Freq: Every day | ORAL | 5 refills | Status: DC

## 2019-01-09 NOTE — Telephone Encounter (Signed)
Patient had a visit with Dr.Tower last week about her blood pressure.  Patient was told to get a blood pressure monitor.  Patient bought 2 blood pressure monitors and neither have worked.  Patient said she doesn't want to go to the pharmacy.  Patient wants to know if she can be put on a blood pressure medication  until she can come in to the office.  Patient said she has been on blood pressure medication, Lozol, several years ago.  Patient said she has had a couple headaches in the last week.  Patient said she's been having irregular periods and they have been regular the past 2 months. Sometimes she gets a headache with her period.  Patient has had sinus. When she blows her nose it's slightly yellow. Patient uses [facility].

## 2019-01-09 NOTE — Telephone Encounter (Signed)
Patient is agreeable. Please send to [facility] [address] in [place].  She would like to get lab and BP check on Monday 01/30/2019 if that is okay.

## 2019-01-09 NOTE — Telephone Encounter (Signed)
lozol is a diuretic and would require labs drawn in 1-2 weeks with a BP check  This can be done by car/tent outside   Please offer this to her and let me know if I should send it in  thanks

## 2019-01-09 NOTE — Telephone Encounter (Signed)
Please advise 

## 2019-01-09 NOTE — Telephone Encounter (Signed)
I put in a future order for bmp  Please schedule drive thru bp check and lab  thanks

## 2019-01-10 NOTE — Telephone Encounter (Signed)
Lab appt and Nurse visit on 01/30/2019

## 2019-01-30 ENCOUNTER — Ambulatory Visit (INDEPENDENT_AMBULATORY_CARE_PROVIDER_SITE_OTHER): Payer: BLUE CROSS/BLUE SHIELD | Admitting: *Deleted

## 2019-01-30 ENCOUNTER — Other Ambulatory Visit (INDEPENDENT_AMBULATORY_CARE_PROVIDER_SITE_OTHER): Payer: BLUE CROSS/BLUE SHIELD

## 2019-01-30 ENCOUNTER — Other Ambulatory Visit: Payer: Self-pay

## 2019-01-30 VITALS — BP 134/78 | HR 80

## 2019-01-30 DIAGNOSIS — I10 Essential (primary) hypertension: Secondary | ICD-10-CM

## 2019-01-30 LAB — BASIC METABOLIC PANEL
BUN: 10 mg/dL (ref 6–23)
CO2: 29 mEq/L (ref 19–32)
Calcium: 8.9 mg/dL (ref 8.4–10.5)
Chloride: 99 mEq/L (ref 96–112)
Creatinine, Ser: 0.64 mg/dL (ref 0.40–1.20)
GFR: 99.08 mL/min (ref >60.00)
Glucose, Bld: 137 mg/dL — ABNORMAL HIGH (ref 70–99)
Potassium: 3.2 mEq/L — ABNORMAL LOW (ref 3.5–5.1)
Sodium: 136 mEq/L (ref 135–145)

## 2019-01-30 NOTE — Progress Notes (Signed)
Per Dr. Glori Bickers encounter order on 01/30/19 patient presents today for a nurse visit blood pressure check for ongoing follow up and management.   Patient is to keep taking medication as prescribed.

## 2019-02-01 ENCOUNTER — Other Ambulatory Visit: Payer: Self-pay

## 2019-02-01 DIAGNOSIS — I10 Essential (primary) hypertension: Secondary | ICD-10-CM

## 2019-02-01 MED ORDER — POTASSIUM CHLORIDE ER 10 MEQ PO TBCR: 10.0000 meq | EXTENDED_RELEASE_TABLET | Freq: Every day | ORAL | 5 refills | Status: DC

## 2019-02-01 NOTE — Telephone Encounter (Signed)
Please send in k clor 10 meq 1 po qd (can take with food) #[phone] ref

## 2019-02-20 ENCOUNTER — Other Ambulatory Visit: Payer: BLUE CROSS/BLUE SHIELD

## 2019-02-21 ENCOUNTER — Other Ambulatory Visit: Payer: BLUE CROSS/BLUE SHIELD

## 2019-02-23 ENCOUNTER — Other Ambulatory Visit: Payer: BLUE CROSS/BLUE SHIELD

## 2019-03-01 ENCOUNTER — Other Ambulatory Visit (INDEPENDENT_AMBULATORY_CARE_PROVIDER_SITE_OTHER): Payer: BLUE CROSS/BLUE SHIELD

## 2019-03-01 ENCOUNTER — Other Ambulatory Visit: Payer: Self-pay

## 2019-03-01 DIAGNOSIS — I10 Essential (primary) hypertension: Secondary | ICD-10-CM

## 2019-03-01 LAB — BASIC METABOLIC PANEL
BUN: 14 mg/dL (ref 6–23)
CO2: 30 mEq/L (ref 19–32)
Calcium: 8.9 mg/dL (ref 8.4–10.5)
Chloride: 101 mEq/L (ref 96–112)
Creatinine, Ser: 0.62 mg/dL (ref 0.40–1.20)
GFR: 102.74 mL/min (ref >60.00)
Glucose, Bld: 95 mg/dL (ref 70–99)
Potassium: 3.4 mEq/L — ABNORMAL LOW (ref 3.5–5.1)
Sodium: 137 mEq/L (ref 135–145)

## 2019-03-02 ENCOUNTER — Encounter: Payer: Self-pay | Admitting: *Deleted

## 2019-04-03 ENCOUNTER — Telehealth: Payer: Self-pay | Admitting: Family Medicine

## 2019-04-03 NOTE — Telephone Encounter (Signed)
Left VM letting pt know Dr. Marliss Coots comments and requested pt to call the office back and schedule a lab appt to recheck labs when able

## 2019-04-03 NOTE — Telephone Encounter (Signed)
Usually leg pain or cramping occurs with low K (not supplementation)  I have not heard of this as a side eff of K  Please schedule lab to re check bmet for hypokalemia to see what level looks like and will advise from there

## 2019-04-03 NOTE — Telephone Encounter (Signed)
I left a detailed message on patient's voice mail that she can have her bp checked at her lab appointment.  Alexa Anderson won't be here on 04/05/19, so please schedule on a different day and avoid an early morning appointment.

## 2019-04-03 NOTE — Telephone Encounter (Signed)
Patient stated that she was recently prescribed potassium to take. She stated that since she has started taking this medication she has noticed pain in her legs. Patient stated that the pain happens through out the day even when she first wakes up in the morning. She would like to know if there is something else she could take that would not cause this side effect   Patient's C/B #  [phone]

## 2019-04-03 NOTE — Telephone Encounter (Signed)
Pt called to schedule labs and wanted to know if she can get her bp checked at same time [phone]  Best number

## 2019-04-03 NOTE — Telephone Encounter (Signed)
If you are still doing nurse visits for that please schedule Thanks

## 2019-04-05 ENCOUNTER — Other Ambulatory Visit: Payer: Self-pay

## 2019-04-05 ENCOUNTER — Ambulatory Visit: Payer: BC Managed Care – PPO | Admitting: Family Medicine

## 2019-04-05 ENCOUNTER — Encounter: Payer: Self-pay | Admitting: Family Medicine

## 2019-04-05 VITALS — BP 148/92 | HR 91 | Temp 98.6°F | Ht 67.0 in | Wt 263.0 lb

## 2019-04-05 DIAGNOSIS — I10 Essential (primary) hypertension: Secondary | ICD-10-CM | POA: Diagnosis not present

## 2019-04-05 DIAGNOSIS — R29898 Other symptoms and signs involving the musculoskeletal system: Secondary | ICD-10-CM | POA: Insufficient documentation

## 2019-04-05 DIAGNOSIS — F430 Acute stress reaction: Secondary | ICD-10-CM | POA: Diagnosis not present

## 2019-04-05 MED ORDER — AMLODIPINE BESYLATE 5 MG PO TABS: 5.0000 mg | ORAL_TABLET | Freq: Every day | ORAL | 11 refills | Status: DC

## 2019-04-05 NOTE — Patient Instructions (Signed)
Aim for 64 oz of fluid per day- mostly water  Especially working outside [facility] [employer] may need to vary exercise- bike/yoga/water - to be easier on your legs and feet   Labs today  Continue current medicines  Add amlodipine 5 mg once daily  If any side effects or problems let me know   F/u 3 months

## 2019-04-05 NOTE — Progress Notes (Signed)
Subjective:    Patient ID: Alexa Anderson, female    DOB: 01-13-1971, 48 y.o.   MRN: [id number]  HPI Here for f/u for HTN   Wt Readings from Last 3 Encounters:  04/05/19 263 lb (119.3 kg)  12/12/18 274 lb 4 oz (124.4 kg)  10/24/18 266 lb 8 oz (120.9 kg)  she is loosing weight  Walking a lot for exercise - very active /proud of this  More than 5 days per week gets over 10,000 steps -working a lot outdoors Eating better  More fruit and veg  41.19 kg/m   Concerned about BP last visit  Last visit discussed getting a home bp cuff  Also rev proper way to check bp  Enc her to also start weighing herself and working on healthy diet and exercise  Her legs still feel heavy    BP Readings from Last 3 Encounters:  04/05/19 (!) 162/100  01/30/19 134/78  12/12/18 (!) 141/90  she takes lozol 1.25 mg daily -thiazide diuretic  Also potassium  On 2nd check bp is improved BP: (!) 148/92      Pulse Readings from Last 3 Encounters:  04/05/19 91  01/30/19 80  12/12/18 78    5/29- menses and then came again on 6/18  Feels like she may have another one  Perimenopause- a little more hot natured   Mood correlates with her stress Laid off 17 people 20% pay reduction  She works in [employer] - trying to help people out  Really tough  Ex boyfriend re entered her life -his current girlfriend died of a drug overdose  Ex husband died Jan 04, 2023 of cancer  Son is gay and struggling with that   Overall she feels positive however     Last labs Lab Results  Component Value Date   CREATININE 0.62 03/01/2019   BUN 14 03/01/2019   NA 137 03/01/2019   K 3.4 (L) 03/01/2019   CL 101 03/01/2019   CO2 30 03/01/2019   Lab Results  Component Value Date   TSH 1.29 12/12/2018   Lab Results  Component Value Date   CHOL 180 12/14/2017   HDL 54.30 12/14/2017   LDLCALC 109 (H) 12/14/2017   TRIG 86.0 12/14/2017   CHOLHDL 3 12/14/2017   Patient Active Problem List   Diagnosis Date Noted  .  Amenorrhea 12/12/2018  . Tinnitus 10/24/2018  . Encounter for screening mammogram for breast cancer 05/12/2016  . Routine general medical examination at a health care facility 09/05/2013  . Encounter for routine gynecological examination 09/05/2013  . Stress reaction 09/05/2013  . Screen for STD (sexually transmitted disease) 03/23/2012  . Contraception management 03/23/2012  . Goiter 06/05/2008  . Morbid obesity ([place]) 06/04/2008  . H/O: depression 06/04/2008  . Migraine without aura 06/04/2008  . HYPERTENSION, BENIGN ESSENTIAL 06/04/2008   Past Medical History:  Diagnosis Date  . Goiter   . History of depression   . Hypertension   . Migraines   . Obesity   . Weight loss    use to wiegh over 400 lbs   Past Surgical History:  Procedure Laterality Date  . CESAREAN SECTION    . thyroid ultrasound  02/12   stable 5 mm nodule hypoechoic on left  . WISDOM TOOTH EXTRACTION     Social History   Tobacco Use  . Smoking status: Never Smoker  . Smokeless tobacco: Never Used  Substance Use Topics  . Alcohol use: Yes    Comment: Occasional  .  Drug use: No   Family History  Problem Relation Age of Onset  . Cancer Father        renal cell  . Parkinsonism Father    Allergies  Allergen Reactions  . Bee Venom    Current Outpatient Medications on File Prior to Visit  Medication Sig Dispense Refill  . aspirin 81 MG tablet Take 160 mg by mouth as needed.      . diphenhydrAMINE (BENADRYL) 25 MG tablet Take 25 mg by mouth every 8 (eight) hours as needed.    Marland Kitchen ibuprofen (ADVIL,MOTRIN) 200 MG tablet Take 200 mg by mouth as needed.      . indapamide (LOZOL) 1.25 MG tablet Take 1 tablet (1.25 mg total) by mouth daily. 30 tablet 5  . potassium chloride (K-DUR) 10 MEQ tablet Take 1 tablet (10 mEq total) by mouth daily. 30 tablet 5   No current facility-administered medications on file prior to visit.     Review of Systems  Constitutional: Positive for fatigue. Negative for activity  change, appetite change, fever and unexpected weight change.  HENT: Negative for congestion, ear pain, rhinorrhea, sinus pressure and sore throat.   Eyes: Negative for pain, redness and visual disturbance.  Respiratory: Negative for cough, shortness of breath and wheezing.   Cardiovascular: Negative for chest pain and palpitations.  Gastrointestinal: Negative for abdominal pain, blood in stool, constipation and diarrhea.  Endocrine: Negative for polydipsia and polyuria.  Genitourinary: Negative for dysuria, frequency and urgency.  Musculoskeletal: Positive for arthralgias. Negative for back pain, gait problem, joint swelling and myalgias.  Skin: Negative for pallor and rash.  Allergic/Immunologic: Negative for environmental allergies.  Neurological: Positive for headaches. Negative for dizziness, syncope and light-headedness.       Leg heaviness  Hematological: Negative for adenopathy. Does not bruise/bleed easily.  Psychiatric/Behavioral: Negative for decreased concentration and dysphoric mood. The patient is not nervous/anxious.        Stressors       Objective:   Physical Exam Constitutional:      General: She is not in acute distress.    Appearance: Normal appearance. She is well-developed. She is obese. She is not ill-appearing or diaphoretic.  HENT:     Head: Normocephalic and atraumatic.     Mouth/Throat:     Mouth: Mucous membranes are moist.     Pharynx: Oropharynx is clear.  Eyes:     General: No scleral icterus.    Conjunctiva/sclera: Conjunctivae normal.     Pupils: Pupils are equal, round, and reactive to light.  Neck:     Musculoskeletal: Normal range of motion and neck supple.     Thyroid: No thyromegaly.     Vascular: No carotid bruit or JVD.  Cardiovascular:     Rate and Rhythm: Normal rate and regular rhythm.     Pulses: Normal pulses.     Heart sounds: Normal heart sounds. No murmur. No gallop.   Pulmonary:     Effort: Pulmonary effort is normal. No  respiratory distress.     Breath sounds: Normal breath sounds. No wheezing or rales.  Abdominal:     General: Bowel sounds are normal. There is no distension or abdominal bruit.     Palpations: Abdomen is soft. There is no mass.     Tenderness: There is no abdominal tenderness.  Musculoskeletal:     Right lower leg: No edema.     Left lower leg: No edema.  Lymphadenopathy:     Cervical: No cervical adenopathy.  Skin:    General: Skin is warm and dry.     Coloration: Skin is not pale.     Findings: No erythema, lesion or rash.  Neurological:     Mental Status: She is alert. Mental status is at baseline.     Motor: No weakness.     Coordination: Coordination normal.     Deep Tendon Reflexes: Reflexes are normal and symmetric. Reflexes normal.  Psychiatric:        Mood and Affect: Mood normal.           Assessment & Plan:   Problem List Items Addressed This Visit      Cardiovascular and Mediastinum   HYPERTENSION, BENIGN ESSENTIAL - Primary    Improved with lozol and lifestyle change but not at goal BP: (!) 148/92    Will add amlodipine 5 mg  Disc poss side eff incl edema -will update  F/u planned  Enc continued lifestyle change and wt loss cmet and lipid panel today      Relevant Medications   amLODipine (NORVASC) 5 MG tablet   Other Relevant Orders   Comprehensive metabolic panel   Lipid panel     Other   Morbid obesity ([place])    Discussed how this problem influences overall health and the risks it imposes  Reviewed plan for weight loss with lower calorie diet (via better food choices and also portion control or program like weight watchers) and exercise building up to or more than 30 minutes 5 days per week including some aerobic activity   Commended wt loss so far with better diet and exercise       Stress reaction    Mood is improved with better health habits and sleep  Good support       Leg heaviness    With nl exam Suspect from new aggressive  change in exercise habits and lower body work Suggested cross training and care not to injure herself  Also continue to work on wt loss and bp control

## 2019-04-05 NOTE — Telephone Encounter (Signed)
Patient scheduled office visit on 04/05/19.

## 2019-04-05 NOTE — Assessment & Plan Note (Signed)
With nl exam Suspect from new aggressive change in exercise habits and lower body work Suggested cross training and care not to injure herself  Also continue to work on wt loss and bp control

## 2019-04-05 NOTE — Assessment & Plan Note (Signed)
Mood is improved with better health habits and sleep  Good support

## 2019-04-05 NOTE — Assessment & Plan Note (Signed)
Discussed how this problem influences overall health and the risks it imposes  Reviewed plan for weight loss with lower calorie diet (via better food choices and also portion control or program like weight watchers) and exercise building up to or more than 30 minutes 5 days per week including some aerobic activity   Commended wt loss so far with better diet and exercise

## 2019-04-05 NOTE — Assessment & Plan Note (Signed)
Improved with lozol and lifestyle change but not at goal BP: (!) 148/92    Will add amlodipine 5 mg  Disc poss side eff incl edema -will update  F/u planned  Enc continued lifestyle change and wt loss cmet and lipid panel today

## 2019-04-06 ENCOUNTER — Encounter: Payer: Self-pay | Admitting: *Deleted

## 2019-04-06 LAB — COMPREHENSIVE METABOLIC PANEL
ALT: 17 U/L (ref 0–35)
AST: 19 U/L (ref 0–37)
Albumin: 4.3 g/dL (ref 3.5–5.2)
Alkaline Phosphatase: 93 U/L (ref 39–117)
BUN: 15 mg/dL (ref 6–23)
CO2: 29 mEq/L (ref 19–32)
Calcium: 9.5 mg/dL (ref 8.4–10.5)
Chloride: 99 mEq/L (ref 96–112)
Creatinine, Ser: 0.71 mg/dL (ref 0.40–1.20)
GFR: 87.83 mL/min (ref >60.00)
Glucose, Bld: 83 mg/dL (ref 70–99)
Potassium: 3.7 mEq/L (ref 3.5–5.1)
Sodium: 137 mEq/L (ref 135–145)
Total Bilirubin: 0.5 mg/dL (ref 0.2–1.2)
Total Protein: 7.4 g/dL (ref 6.0–8.3)

## 2019-04-06 LAB — LIPID PANEL
Cholesterol: 158 mg/dL (ref 0–200)
HDL: 61.6 mg/dL (ref >39.00)
LDL Cholesterol: 87 mg/dL (ref 0–99)
NonHDL: 96.66
Total CHOL/HDL Ratio: 3
Triglycerides: 48 mg/dL (ref 0.0–149.0)
VLDL: 9.6 mg/dL (ref 0.0–40.0)

## 2019-07-07 ENCOUNTER — Ambulatory Visit: Payer: BC Managed Care – PPO | Admitting: Family Medicine

## 2019-07-12 ENCOUNTER — Other Ambulatory Visit: Payer: Self-pay

## 2019-07-12 ENCOUNTER — Ambulatory Visit: Payer: BC Managed Care – PPO | Admitting: Family Medicine

## 2019-07-12 ENCOUNTER — Encounter: Payer: Self-pay | Admitting: Family Medicine

## 2019-07-12 VITALS — BP 130/85 | HR 75 | Temp 97.9°F | Ht 67.0 in | Wt 262.1 lb

## 2019-07-12 DIAGNOSIS — I10 Essential (primary) hypertension: Secondary | ICD-10-CM

## 2019-07-12 DIAGNOSIS — M79605 Pain in left leg: Secondary | ICD-10-CM

## 2019-07-12 DIAGNOSIS — M79604 Pain in right leg: Secondary | ICD-10-CM | POA: Diagnosis not present

## 2019-07-12 DIAGNOSIS — F430 Acute stress reaction: Secondary | ICD-10-CM | POA: Diagnosis not present

## 2019-07-12 DIAGNOSIS — G43009 Migraine without aura, not intractable, without status migrainosus: Secondary | ICD-10-CM

## 2019-07-12 DIAGNOSIS — M79606 Pain in leg, unspecified: Secondary | ICD-10-CM | POA: Insufficient documentation

## 2019-07-12 NOTE — Assessment & Plan Note (Signed)
This continues Stressors are the same  Good exercise  Reviewed stressors/ coping techniques/symptoms/ support sources/ tx options and side effects in detail today  She is tearful today  Biggest stressor is work  Ref made to counseling (hers retired)  No SI  Enc good self care

## 2019-07-12 NOTE — Assessment & Plan Note (Signed)
Previously leg heaviness has turned into pain  She thinks it may be side eff of amlodipine (unlikely) Will hold it 1 week just to be sure and let us know  Nl exam  Suspect walking is worsening this along with obesity  Long disc re: cross training (doing lower impact exercise some days) may be important  Also enc to wear better everyday shoes  No joint swelling/will watch this  Enc her to consider stretching/yoga  Stress may also worsen pain

## 2019-07-12 NOTE — Assessment & Plan Note (Signed)
Discussed how this problem influences overall health and the risks it imposes  Reviewed plan for weight loss with lower calorie diet (via better food choices and also portion control or program like weight watchers) and exercise building up to or more than 30 minutes 5 days per week including some aerobic activity   Pt is frustrated She may need to cut calories from processed carbs more Also needs to change up exercise to include some lower impact activities /resistance training

## 2019-07-12 NOTE — Assessment & Plan Note (Signed)
bp in fair control at this time  BP Readings from Last 1 Encounters:  07/12/19 130/85   No changes needed however pt does wonder if amlodipine is causing leg pain (unlikely)  She will hold for a week and let us know  Will change tx if needed  Most recent labs reviewed  Disc lifstyle change with low sodium diet and exercise

## 2019-07-12 NOTE — Patient Instructions (Addendum)
Hold your amlodipine for 1 week  If pain goes away-call and let me know so we can change to something else   Blood pressure is better   Switch up exercise  You need a mix of walking some days and lower impact other days  Yoga/bike/exercise bike/ water exercise  Machines   Wear better shoes   Cut calories a little more   We will hear back from you in about a week and make a plan from there

## 2019-07-12 NOTE — Progress Notes (Signed)
Subjective:    Patient ID: Alexa Anderson, female    DOB: Jul 21, 1971, 48 y.o.   MRN: [id number]  HPI Pt presents for HTN f/u Stressed and came late today- could not get out of work  Is tearful  Wt Readings from Last 3 Encounters:  07/12/19 262 lb 2 oz (118.9 kg)  04/05/19 263 lb (119.3 kg)  12/12/18 274 lb 4 oz (124.4 kg)  stable  Frustrated wanting to loose more 41.05 kg/m   Last visit added amlodipine to medications for HTN   bp is improved today  No cp or palpitations or headaches or edema   BP Readings from Last 3 Encounters:  07/12/19 (!) 130/92  04/05/19 (!) 148/92  01/30/19 134/78    2nd check BP: 130/85   She is working hard to loose weight  Fit bit -shows it  Beating herself up   In pain all the time   Last time here - leg heaviness with some pain (joints and muscles) Has had more severe pain since starting amlodipine   Primary exercise is walking  Most of pain is below mid thigh  No cramps  ? Muscle or joint pain  No joint swelling or redness   Bothers her on days she walks or is active Less active- not as bad   She does take nsaid occ aleve  occ ibuprofen -not same   Patient Active Problem List   Diagnosis Date Noted  . Leg pain 07/12/2019  . Leg heaviness 04/05/2019  . Amenorrhea 12/12/2018  . Tinnitus 10/24/2018  . Encounter for screening mammogram for breast cancer 05/12/2016  . Routine general medical examination at a health care facility 09/05/2013  . Encounter for routine gynecological examination 09/05/2013  . Stress reaction 09/05/2013  . Screen for STD (sexually transmitted disease) 03/23/2012  . Contraception management 03/23/2012  . Goiter 06/05/2008  . Morbid obesity ([place]) 06/04/2008  . H/O: depression 06/04/2008  . Migraine without aura 06/04/2008  . HYPERTENSION, BENIGN ESSENTIAL 06/04/2008   Past Medical History:  Diagnosis Date  . Goiter   . History of depression   . Hypertension   . Migraines   . Obesity   .  Weight loss    use to wiegh over 400 lbs   Past Surgical History:  Procedure Laterality Date  . CESAREAN SECTION    . thyroid ultrasound  02/12   stable 5 mm nodule hypoechoic on left  . WISDOM TOOTH EXTRACTION     Social History   Tobacco Use  . Smoking status: Never Smoker  . Smokeless tobacco: Never Used  Substance Use Topics  . Alcohol use: Yes    Comment: Occasional  . Drug use: No   Family History  Problem Relation Age of Onset  . Cancer Father        renal cell  . Parkinsonism Father    Allergies  Allergen Reactions  . Bee Venom    Current Outpatient Medications on File Prior to Visit  Medication Sig Dispense Refill  . amLODipine (NORVASC) 5 MG tablet Take 1 tablet (5 mg total) by mouth daily. 30 tablet 11  . aspirin 81 MG tablet Take 160 mg by mouth as needed.      . diphenhydrAMINE (BENADRYL) 25 MG tablet Take 25 mg by mouth every 8 (eight) hours as needed.    Marland Kitchen ibuprofen (ADVIL,MOTRIN) 200 MG tablet Take 200 mg by mouth as needed.      . indapamide (LOZOL) 1.25 MG tablet Take 1  tablet (1.25 mg total) by mouth daily. 30 tablet 5  . potassium chloride (K-DUR) 10 MEQ tablet Take 1 tablet (10 mEq total) by mouth daily. 30 tablet 5   No current facility-administered medications on file prior to visit.     Review of Systems  Constitutional: Positive for fatigue. Negative for activity change, appetite change, fever and unexpected weight change.  HENT: Negative for congestion, ear pain, rhinorrhea, sinus pressure and sore throat.   Eyes: Negative for pain, redness and visual disturbance.  Respiratory: Negative for cough, shortness of breath and wheezing.   Cardiovascular: Negative for chest pain and palpitations.  Gastrointestinal: Negative for abdominal pain, blood in stool, constipation and diarrhea.  Endocrine: Negative for polydipsia and polyuria.  Genitourinary: Negative for dysuria, frequency and urgency.  Musculoskeletal: Positive for arthralgias and  myalgias. Negative for back pain.  Skin: Negative for pallor and rash.  Allergic/Immunologic: Negative for environmental allergies.  Neurological: Negative for dizziness, syncope and headaches.  Hematological: Negative for adenopathy. Does not bruise/bleed easily.  Psychiatric/Behavioral: Positive for dysphoric mood. Negative for decreased concentration.       Objective:   Physical Exam Constitutional:      General: She is not in acute distress.    Appearance: Normal appearance. She is well-developed. She is obese. She is not ill-appearing or diaphoretic.  HENT:     Head: Normocephalic and atraumatic.  Eyes:     Conjunctiva/sclera: Conjunctivae normal.     Pupils: Pupils are equal, round, and reactive to light.  Neck:     Musculoskeletal: Normal range of motion and neck supple.     Thyroid: No thyromegaly.     Vascular: No carotid bruit or JVD.  Cardiovascular:     Rate and Rhythm: Normal rate and regular rhythm.     Heart sounds: Normal heart sounds. No gallop.   Pulmonary:     Effort: Pulmonary effort is normal. No respiratory distress.     Breath sounds: Normal breath sounds. No wheezing or rales.  Abdominal:     General: Bowel sounds are normal. There is no distension or abdominal bruit.     Palpations: Abdomen is soft. There is no mass.     Tenderness: There is no abdominal tenderness.  Musculoskeletal:     Comments: No leg tenderness /swelling/erythema /warmth or palpable cords  Nl rom knees/ankles  No acute joint changes or crepitus  High arch in feet  Nl pedal pulses    Lymphadenopathy:     Cervical: No cervical adenopathy.  Skin:    General: Skin is warm and dry.     Coloration: Skin is not pale.     Findings: No erythema or rash.  Neurological:     Mental Status: She is alert.     Cranial Nerves: No cranial nerve deficit.     Coordination: Coordination normal.     Deep Tendon Reflexes: Reflexes are normal and symmetric. Reflexes normal.  Psychiatric:         Attention and Perception: Attention normal.        Mood and Affect: Mood is anxious and depressed.        Cognition and Memory: Cognition and memory normal.     Comments: Anxious /depressed and tearful today  Discusses stressors candidly           Assessment & Plan:   Problem List Items Addressed This Visit      Cardiovascular and Mediastinum   Migraine without aura    Headaches are improved with  bp control      HYPERTENSION, BENIGN ESSENTIAL - Primary    bp in fair control at this time  BP Readings from Last 1 Encounters:  07/12/19 130/85   No changes needed however pt does wonder if amlodipine is causing leg pain (unlikely)  She will hold for a week and let us know  Will change tx if needed  Most recent labs reviewed  Disc lifstyle change with low sodium diet and exercise          Other   Morbid obesity ([place])    Discussed how this problem influences overall health and the risks it imposes  Reviewed plan for weight loss with lower calorie diet (via better food choices and also portion control or program like weight watchers) and exercise building up to or more than 30 minutes 5 days per week including some aerobic activity   Pt is frustrated She may need to cut calories from processed carbs more Also needs to change up exercise to include some lower impact activities /resistance training       Stress reaction    This continues Stressors are the same  Good exercise  Reviewed stressors/ coping techniques/symptoms/ support sources/ tx options and side effects in detail today  She is tearful today  Biggest stressor is work  Ref made to counseling (hers retired)  No SI  Enc good self care      Relevant Orders   Ambulatory referral to Psychology   Leg pain    Previously leg heaviness has turned into pain  She thinks it may be side eff of amlodipine (unlikely) Will hold it 1 week just to be sure and let us know  Nl exam  Suspect walking is worsening this  along with obesity  Long disc re: cross training (doing lower impact exercise some days) may be important  Also enc to wear better everyday shoes  No joint swelling/will watch this  Enc her to consider stretching/yoga  Stress may also worsen pain

## 2019-07-12 NOTE — Assessment & Plan Note (Signed)
Headaches are improved with bp control

## 2019-07-18 ENCOUNTER — Encounter: Payer: Self-pay | Admitting: Family Medicine

## 2019-11-03 ENCOUNTER — Ambulatory Visit (INDEPENDENT_AMBULATORY_CARE_PROVIDER_SITE_OTHER): Payer: BC Managed Care – PPO | Admitting: Psychology

## 2019-11-03 DIAGNOSIS — F3289 Other specified depressive episodes: Secondary | ICD-10-CM | POA: Diagnosis not present

## 2019-11-08 ENCOUNTER — Ambulatory Visit (INDEPENDENT_AMBULATORY_CARE_PROVIDER_SITE_OTHER): Payer: BC Managed Care – PPO | Admitting: Psychology

## 2019-11-08 DIAGNOSIS — F3289 Other specified depressive episodes: Secondary | ICD-10-CM | POA: Diagnosis not present

## 2019-11-17 ENCOUNTER — Ambulatory Visit (INDEPENDENT_AMBULATORY_CARE_PROVIDER_SITE_OTHER): Payer: BC Managed Care – PPO | Admitting: Psychology

## 2019-11-17 DIAGNOSIS — F3289 Other specified depressive episodes: Secondary | ICD-10-CM

## 2019-11-29 ENCOUNTER — Ambulatory Visit (INDEPENDENT_AMBULATORY_CARE_PROVIDER_SITE_OTHER): Payer: BC Managed Care – PPO | Admitting: Psychology

## 2019-11-29 DIAGNOSIS — F3289 Other specified depressive episodes: Secondary | ICD-10-CM

## 2019-12-18 ENCOUNTER — Ambulatory Visit (INDEPENDENT_AMBULATORY_CARE_PROVIDER_SITE_OTHER): Payer: BC Managed Care – PPO | Admitting: Psychology

## 2019-12-18 DIAGNOSIS — F3289 Other specified depressive episodes: Secondary | ICD-10-CM | POA: Diagnosis not present

## 2020-01-04 ENCOUNTER — Ambulatory Visit: Payer: BC Managed Care – PPO | Admitting: Psychology

## 2020-01-19 ENCOUNTER — Ambulatory Visit: Payer: BC Managed Care – PPO | Admitting: Psychology

## 2020-01-26 ENCOUNTER — Ambulatory Visit (INDEPENDENT_AMBULATORY_CARE_PROVIDER_SITE_OTHER): Payer: BC Managed Care – PPO | Admitting: Psychology

## 2020-01-26 DIAGNOSIS — F3289 Other specified depressive episodes: Secondary | ICD-10-CM

## 2020-02-07 ENCOUNTER — Ambulatory Visit (INDEPENDENT_AMBULATORY_CARE_PROVIDER_SITE_OTHER): Payer: BC Managed Care – PPO | Admitting: Family Medicine

## 2020-02-07 ENCOUNTER — Other Ambulatory Visit (HOSPITAL_COMMUNITY)
Admission: RE | Admit: 2020-02-07 | Discharge: 2020-02-07 | Disposition: A | Discharge status: Home / Self Care | Payer: BC Managed Care – PPO | Source: Ambulatory Visit | Attending: Family Medicine | Admitting: Family Medicine

## 2020-02-07 ENCOUNTER — Other Ambulatory Visit: Payer: Self-pay

## 2020-02-07 ENCOUNTER — Encounter: Payer: Self-pay | Admitting: Family Medicine

## 2020-02-07 VITALS — BP 148/96 | HR 75 | Temp 98.6°F | Ht 67.0 in | Wt 289.4 lb

## 2020-02-07 DIAGNOSIS — Z0000 Encounter for general adult medical examination without abnormal findings: Secondary | ICD-10-CM

## 2020-02-07 DIAGNOSIS — F430 Acute stress reaction: Secondary | ICD-10-CM

## 2020-02-07 DIAGNOSIS — N912 Amenorrhea, unspecified: Secondary | ICD-10-CM | POA: Diagnosis not present

## 2020-02-07 DIAGNOSIS — Z01419 Encounter for gynecological examination (general) (routine) without abnormal findings: Secondary | ICD-10-CM

## 2020-02-07 DIAGNOSIS — I10 Essential (primary) hypertension: Secondary | ICD-10-CM | POA: Diagnosis not present

## 2020-02-07 DIAGNOSIS — E049 Nontoxic goiter, unspecified: Secondary | ICD-10-CM

## 2020-02-07 LAB — CBC WITH DIFFERENTIAL/PLATELET
Basophils Absolute: 0 10*3/uL (ref 0.0–0.1)
Basophils Relative: 0.4 % (ref 0.0–3.0)
Eosinophils Absolute: 0.1 10*3/uL (ref 0.0–0.7)
Eosinophils Relative: 2.2 % (ref 0.0–5.0)
HCT: 38.4 % (ref 36.0–46.0)
Hemoglobin: 13 g/dL (ref 12.0–15.0)
Lymphocytes Relative: 27.9 % (ref 12.0–46.0)
Lymphs Abs: 1.7 10*3/uL (ref 0.7–4.0)
MCHC: 34 g/dL (ref 30.0–36.0)
MCV: 86.2 fl (ref 78.0–100.0)
Monocytes Absolute: 0.5 10*3/uL (ref 0.1–1.0)
Monocytes Relative: 8.3 % (ref 3.0–12.0)
Neutro Abs: 3.7 10*3/uL (ref 1.4–7.7)
Neutrophils Relative %: 61.2 % (ref 43.0–77.0)
Platelets: 257 10*3/uL (ref 150.0–400.0)
RBC: 4.45 Mil/uL (ref 3.87–5.11)
RDW: 14.4 % (ref 11.5–15.5)
WBC: 6 10*3/uL (ref 4.0–10.5)

## 2020-02-07 LAB — LIPID PANEL
Cholesterol: 172 mg/dL (ref 0–200)
HDL: 57.1 mg/dL (ref >39.00)
LDL Cholesterol: 102 mg/dL — ABNORMAL HIGH (ref 0–99)
NonHDL: 115.33
Total CHOL/HDL Ratio: 3
Triglycerides: 66 mg/dL (ref 0.0–149.0)
VLDL: 13.2 mg/dL (ref 0.0–40.0)

## 2020-02-07 LAB — COMPREHENSIVE METABOLIC PANEL
ALT: 18 U/L (ref 0–35)
AST: 15 U/L (ref 0–37)
Albumin: 4.4 g/dL (ref 3.5–5.2)
Alkaline Phosphatase: 96 U/L (ref 39–117)
BUN: 13 mg/dL (ref 6–23)
CO2: 25 mEq/L (ref 19–32)
Calcium: 9.3 mg/dL (ref 8.4–10.5)
Chloride: 104 mEq/L (ref 96–112)
Creatinine, Ser: 0.61 mg/dL (ref 0.40–1.20)
GFR: 104.28 mL/min (ref >60.00)
Glucose, Bld: 103 mg/dL — ABNORMAL HIGH (ref 70–99)
Potassium: 3.8 mEq/L (ref 3.5–5.1)
Sodium: 139 mEq/L (ref 135–145)
Total Bilirubin: 0.8 mg/dL (ref 0.2–1.2)
Total Protein: 7.1 g/dL (ref 6.0–8.3)

## 2020-02-07 LAB — TSH: TSH: 1.81 u[IU]/mL (ref 0.35–4.50)

## 2020-02-07 LAB — FOLLICLE STIMULATING HORMONE: FSH: 29.5 m[IU]/mL

## 2020-02-07 LAB — LUTEINIZING HORMONE: LH: 44.34 m[IU]/mL

## 2020-02-07 MED ORDER — AMLODIPINE BESYLATE 5 MG PO TABS: 5.0000 mg | ORAL_TABLET | Freq: Every day | ORAL | 11 refills | Status: DC

## 2020-02-07 NOTE — Patient Instructions (Addendum)
Please make your appt. For a mammogram at the breast center   Start the amlodipine and if legs hurt stop it and let us know  Follow up in a month for blood pressure visit   For weight loss and better health  Try to get most of your carbohydrates from produce (with the exception of white potatoes)  Eat less bread/pasta/rice/snack foods/cereals/sweets and other items from the middle of the grocery store (processed carbs)  Labs today

## 2020-02-07 NOTE — Assessment & Plan Note (Signed)
No clinical changes TSH today

## 2020-02-07 NOTE — Assessment & Plan Note (Signed)
Exam done with pap  Not sexually active  irreg menses

## 2020-02-07 NOTE — Assessment & Plan Note (Signed)
Pt stopped all of her bp medication on her own along with K Suspects that the lozol caused leg pain  Leg pain is better  bp is elevated and wt is up also BP: (!) 148/96    Will re start amlodipine 5 mg  Disc health habit changes  Plans to eat better/exercise/loose wt  DASH eating plan recommended F/u 1 mo  Will inc tx if needed  inst pt to notify us if leg pain returns
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
## 2020-02-07 NOTE — Assessment & Plan Note (Signed)
Reviewed health habits including diet and exercise and skin cancer prevention Reviewed appropriate screening tests for age  Also reviewed health mt list, fam hx and immunization status , as well as social and family history   See HPI Labs ordered  Amlodipine re started for BP Pt will make mammogram appt -given the #  Gyn exam done Has had covid vaccines  Declines need for STD testing

## 2020-02-07 NOTE — Assessment & Plan Note (Signed)
?   Menopause vs perimenopause  Missed menses in march and april At risk of endometrial hyperplasia due to weight as well  Nl exam  She is having menopausal symptoms (anxiety and hot flashes)  FSH and LH today
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
## 2020-02-07 NOTE — Assessment & Plan Note (Signed)
Discussed how this problem influences overall health and the risks it imposes  Reviewed plan for weight loss with lower calorie diet (via better food choices and also portion control or program like weight watchers) and exercise building up to or more than 30 minutes 5 days per week including some aerobic activity    

## 2020-02-08 ENCOUNTER — Telehealth: Payer: Self-pay | Admitting: Family Medicine

## 2020-02-08 DIAGNOSIS — N912 Amenorrhea, unspecified: Secondary | ICD-10-CM

## 2020-02-08 LAB — CYTOLOGY - PAP
Adequacy: ABSENT
Diagnosis: NEGATIVE

## 2020-02-08 NOTE — Telephone Encounter (Signed)
-----   Message from Tammi Sou, [place] sent at 02/08/2020 10:14 AM EDT ----- Pt advised of lab results and Dr. Marliss Coots comments. Pt agrees with [place] referral and she would like to get it done in [place]. I advised pt our [facility] will call to schedule appt, please put order in

## 2020-02-08 NOTE — Telephone Encounter (Signed)
Referral done Will route to [facility]  

## 2020-02-09 ENCOUNTER — Encounter: Payer: Self-pay | Admitting: *Deleted

## 2020-02-16 ENCOUNTER — Ambulatory Visit
Admission: RE | Admit: 2020-02-16 | Discharge: 2020-02-16 | Disposition: A | Discharge status: Home / Self Care | Payer: BC Managed Care – PPO | Source: Ambulatory Visit | Attending: Family Medicine | Admitting: Family Medicine

## 2020-02-16 DIAGNOSIS — N912 Amenorrhea, unspecified: Secondary | ICD-10-CM

## 2020-02-16 DIAGNOSIS — N852 Hypertrophy of uterus: Secondary | ICD-10-CM | POA: Diagnosis not present

## 2020-02-19 ENCOUNTER — Telehealth: Payer: Self-pay | Admitting: Family Medicine

## 2020-02-19 DIAGNOSIS — N912 Amenorrhea, unspecified: Secondary | ICD-10-CM

## 2020-02-19 DIAGNOSIS — N838 Other noninflammatory disorders of ovary, fallopian tube and broad ligament: Secondary | ICD-10-CM

## 2020-02-19 DIAGNOSIS — N852 Hypertrophy of uterus: Secondary | ICD-10-CM

## 2020-02-19 NOTE — Telephone Encounter (Signed)
Order done Will send to [facility]

## 2020-02-19 NOTE — Telephone Encounter (Signed)
-----   Message from Randall An, RN sent at 02/19/2020 11:23 AM EDT ----- Advised pt of results. Pt would like GYN referral to [facility]. Advised pt our referrals dept would contact her. Advised pt of MyChart. Pt asked to get signed up. Sent pt link. Pt verbalized understanding and verbalized understanding.

## 2020-02-20 ENCOUNTER — Other Ambulatory Visit: Payer: Self-pay

## 2020-02-21 ENCOUNTER — Ambulatory Visit (INDEPENDENT_AMBULATORY_CARE_PROVIDER_SITE_OTHER): Payer: BC Managed Care – PPO | Admitting: Obstetrics and Gynecology

## 2020-02-21 ENCOUNTER — Ambulatory Visit: Payer: Self-pay | Admitting: Obstetrics and Gynecology

## 2020-02-21 ENCOUNTER — Encounter: Payer: Self-pay | Admitting: Obstetrics and Gynecology

## 2020-02-21 VITALS — BP 134/88 | Ht 67.0 in | Wt 288.0 lb

## 2020-02-21 DIAGNOSIS — N852 Hypertrophy of uterus: Secondary | ICD-10-CM

## 2020-02-21 DIAGNOSIS — N959 Unspecified menopausal and perimenopausal disorder: Secondary | ICD-10-CM | POA: Diagnosis not present

## 2020-02-21 DIAGNOSIS — N951 Menopausal and female climacteric states: Secondary | ICD-10-CM

## 2020-02-21 NOTE — Progress Notes (Signed)
Alexa Anderson [birth year] RN:[id number]  SUBJECTIVE:  49 y.o. G74P1011 female referred by her primary doctor for irregular menstruation.  She had a pelvic ultrasound 02/16/2020 which showed a significantly enlarged uterus measuring 22.2 x 10.2 x 16.6 cm.  There were no discrete masses identified per the report. [place] this year was 49.1, last year was 49.1.   The patient states that her period started getting irregular last year. Prior to that she had a regular monthly period, even before taking birth control.  She was on birth control pill for a few years when she was with new partner at that time.  She is no longer taking the birth control pill.  Not currently sexually active.  She went about 3 months without a period, then when she had it was just a normal period, but actually lasting shorter than average, now she only has 3-5 days of flow, previously flow lasted 5-7 days.  Intensity of menstrual flow is about the same as before, sometimes even lighter.  She is not having any irregular bleeding otherwise in between her periods.  She is not having any pelvic pain, changes in her bowel habits or appetite, significant changes in weight, or abdominal pressure.  She plans to direct efforts towards weight loss.  She has had a lot of stress in her life right now dealing with the former partner and getting him formally evicted from her residence after the relationship ended since he would not leave voluntarily.    She has had 1 prior cesarean delivery.  She does get moderate hot flashes 3-4 nights per week not typically during the day.  Current Outpatient Medications  Medication Sig Dispense Refill  . amLODipine (NORVASC) 5 MG tablet Take 1 tablet (5 mg total) by mouth daily. 30 tablet 11  . aspirin 81 MG tablet Take 160 mg by mouth as needed.      . diphenhydrAMINE (BENADRYL) 25 MG tablet Take 25 mg by mouth every 8 (eight) hours as needed.     No current facility-administered medications for this visit.     Allergies: Bee venom  Patient's last menstrual period was 11/24/2019.  Past medical history,surgical history, problem list, medications, allergies, family history and social history were all reviewed and documented as reviewed in the EPIC chart.  ROS:  Feeling well. No dyspnea or chest pain on exertion.  No abdominal pain, change in bowel habits, black or bloody stools.  No urinary tract symptoms. GYN ROS: Irregular/infrequent menses, no abnormal bleeding, pelvic pain or discharge, no breast pain or new or enlarging lumps on self exam. No neurological complaints.    OBJECTIVE:  BP 134/88 (Cuff Size: Large)   Ht 5\' 7"  (1.702 m)   Wt 288 lb (130.6 kg)   LMP 11/24/2019   BMI 45.11 kg/m  The patient appears well, alert, oriented x 3, in no distress. Abdomen: Soft above the umbilicus, palpable firmness diffusely inferior to the umbilicus, nontender PELVIC EXAM: VULVA: normal appearing vulva with no masses, tenderness or lesions, UTERUS: uterus is nontender, enlarged to 18 week's size, ADNEXA: Difficult to palpate due to body habitus and the enlarged uterus, but no apparent tenderness  Chaperone: Caryn Bee present during the examination  ASSESSMENT:  49 y.o. G2P1011 here with perimenopausal menstrual changes, vasomotor symptoms, and an enlarged uterus  PLAN:  Perimenopause.  We discussed some of her symptoms that are likely attributable to the hormonal transition into the later reproductive years as she goes into the perimenopausal transition.  Menstrual  irregularity including changes in frequency/intensity of menstruation along with mood changes or vasomotor symptoms are all possible.  She would like to monitor her symptoms for now and if they get worse she will let us know.  If her menstrual bleeding is getting prolonged and heavy, we could try some of the hormonal contraceptives if she would be interested, otherwise cycling Provera tends to work well.  If any persistent and ongoing heavy  bleeding despite these measures, further work-up would be undertaken, as we discussed that she does have risk factors for endometrial hyperplasia, but I do not have specific concerns for that at this time given her limited menstrual bleeding symptoms.  Her [place] levels are fluctuating but appear to be slightly elevated, which would be consistent with perimenopause.  She had a recent normal Pap smear earlier this month, her blood counts and TSH were normal.  She says her vasomotor symptoms are manageable at this time but she will let us know if that changes.  She has a significantly enlarged uterus on ultrasound and by pelvic examination.  She is relatively asymptomatic and is not having any menorrhagia symptoms.  Reassurance is provided.  I did review the images from the ultrasound and they are a bit hard to interpret given the apparent large size of the uterus, particularly the views of the adnexa and the question of an enlarged right ovary, so I did recommend she get a pelvic MRI without contrast.  We will have staff contact her to help arrange this and then bring her back for an appointment or virtual visit to discuss the results.  Depending on the appearance of the uterus on ultrasound (such as enlarging fibroid masses), and if she develops any abnormal bleeding symptoms (frequent or heavy menstruation), then I would recommend endometrial sampling at that point.   Joseph Pierini MD 02/21/20

## 2020-02-22 ENCOUNTER — Telehealth: Payer: Self-pay | Admitting: *Deleted

## 2020-02-22 DIAGNOSIS — N852 Hypertrophy of uterus: Secondary | ICD-10-CM

## 2020-02-22 NOTE — Telephone Encounter (Signed)
-----   Message from Alexa Pierini, MD sent at 02/21/2020  2:57 PM EDT ----- Regarding: pelvic MRI Hi, this patient has an enlarged uterus and possibly and ovarian cyst. Poor images on ultrasound, I recommend that she get a pelvic MRI.  Please help her to coordinate. Thank you.

## 2020-02-22 NOTE — Telephone Encounter (Signed)
Order placed for MRI pelvis w/wo contrast order placed, number given to patient to call and schedule.

## 2020-02-23 NOTE — Telephone Encounter (Signed)
Scheduled on 03/28/20.

## 2020-02-29 ENCOUNTER — Ambulatory Visit (INDEPENDENT_AMBULATORY_CARE_PROVIDER_SITE_OTHER): Payer: BC Managed Care – PPO | Admitting: Psychology

## 2020-02-29 DIAGNOSIS — F3289 Other specified depressive episodes: Secondary | ICD-10-CM

## 2020-03-12 ENCOUNTER — Telehealth: Payer: Self-pay

## 2020-03-12 ENCOUNTER — Other Ambulatory Visit: Payer: Self-pay

## 2020-03-12 ENCOUNTER — Ambulatory Visit: Payer: BC Managed Care – PPO | Admitting: Family Medicine

## 2020-03-12 ENCOUNTER — Encounter: Payer: Self-pay | Admitting: Family Medicine

## 2020-03-12 VITALS — BP 130/80 | HR 78 | Temp 97.2°F | Ht 67.0 in | Wt 292.5 lb

## 2020-03-12 DIAGNOSIS — I10 Essential (primary) hypertension: Secondary | ICD-10-CM | POA: Diagnosis not present

## 2020-03-12 DIAGNOSIS — N852 Hypertrophy of uterus: Secondary | ICD-10-CM | POA: Diagnosis not present

## 2020-03-12 DIAGNOSIS — R7309 Other abnormal glucose: Secondary | ICD-10-CM | POA: Diagnosis not present

## 2020-03-12 NOTE — Assessment & Plan Note (Signed)
Seeing gyn =reviewed notes MRI upcoming for this and poss enl ovary  perimenopausal

## 2020-03-12 NOTE — Patient Instructions (Addendum)
Take care of yourself   Keep exercising   Blood pressure is stable  Continue current medicine   Go forward with the [facility]

## 2020-03-12 NOTE — Assessment & Plan Note (Signed)
bp in fair control at this time  BP Readings from Last 1 Encounters:  03/12/20 130/80   No changes needed Most recent labs reviewed  Disc lifstyle change with low sodium diet and exercise  Tolerating amlodipine well and no longer has leg pain  Starting visits to healthy [facility] clinic  Rev exercise

## 2020-03-12 NOTE — Progress Notes (Signed)
Subjective:    Patient ID: Alexa Anderson, female    DOB: [birth year], 49 y.o.   MRN: [id number]  This visit occurred during the SARS-CoV-2 public health emergency.  Safety protocols were in place, including screening questions prior to the visit, additional usage of staff PPE, and extensive cleaning of exam room while observing appropriate contact time as indicated for disinfecting solutions.    HPI  Pt presents for f/u of chronic medical issue   Wt Readings from Last 3 Encounters:  03/12/20 292 lb 8 oz (132.7 kg)  02/21/20 288 lb (130.6 kg)  02/07/20 289 lb 6 oz (131.3 kg)   45.81 kg/m   Went on vacation - gained weight  Is back to normal now   Had her therapy session as well  / [facility] -has appt next week  Emotional eating is still a problem    HTN  Last visit est that lozol caused leg pain  We changed to amlodipine 5 mg and discussed DASH eating  No more leg pain   BP Readings from Last 3 Encounters:  03/12/20 (!) 146/74  02/21/20 134/88  02/07/20 (!) 148/96  does not check at home   Pt has [place] of pelvis scheduled later this mo to better visualize uterus [place] She started period started the day she saw gyn  Unpredictable menses  Perimenopausal  Some hot flashes      Lab Results  Component Value Date   NA 139 02/07/2020   K 3.8 02/07/2020   CO2 25 02/07/2020   GLUCOSE 103 (H) 02/07/2020   BUN 13 02/07/2020   CREATININE 0.61 02/07/2020   CALCIUM 9.3 02/07/2020   GFRNONAA 102.30 04/30/2010   GFRAA 121 06/26/2008   Lab Results  Component Value Date   WBC 6.0 02/07/2020   HGB 13.0 02/07/2020   HCT 38.4 02/07/2020   MCV 86.2 02/07/2020   PLT 257.0 02/07/2020   Lab Results  Component Value Date   TSH 1.81 02/07/2020    Mild elevated glucose   Patient Active Problem List   Diagnosis Date Noted  . Elevated glucose level 03/12/2020  . Enlarged uterus 02/19/2020  . Enlarged ovary 02/19/2020  . Leg  heaviness 04/05/2019  . Amenorrhea 12/12/2018  . Tinnitus 10/24/2018  . Encounter for screening mammogram for breast cancer 05/12/2016  . Routine general medical examination at a health care facility 09/05/2013  . Encounter for routine gynecological examination 09/05/2013  . Stress reaction 09/05/2013  . Screen for STD (sexually transmitted disease) 03/23/2012  . Goiter 06/05/2008  . Morbid obesity ([place]) 06/04/2008  . H/O: depression 06/04/2008  . Migraine without aura 06/04/2008  . HYPERTENSION, BENIGN ESSENTIAL 06/04/2008   Past Medical History:  Diagnosis Date  . Goiter   . History of depression   . Hypertension   . Migraines   . Obesity   . Weight loss    use to wiegh over 400 lbs   Past Surgical History:  Procedure Laterality Date  . CESAREAN SECTION    . thyroid ultrasound  02/12   stable 5 mm nodule hypoechoic on left  . WISDOM TOOTH EXTRACTION     Social History   Tobacco Use  . Smoking status: Never Smoker  . Smokeless tobacco: Never Used  Substance Use Topics  . Alcohol use: Yes    Comment: Occasional  . Drug use: No   Family History  Problem Relation Age of Onset  . Cancer Father  renal cell  . Parkinsonism Father    Allergies  Allergen Reactions  . Bee Venom Anaphylaxis   Current Outpatient Medications on File Prior to Visit  Medication Sig Dispense Refill  . amLODipine (NORVASC) 5 MG tablet Take 1 tablet (5 mg total) by mouth daily. 30 tablet 11  . aspirin 81 MG tablet Take 160 mg by mouth as needed.      . diphenhydrAMINE (BENADRYL) 25 MG tablet Take 25 mg by mouth every 8 (eight) hours as needed.     No current facility-administered medications on file prior to visit.    Review of Systems  Constitutional: Negative for activity change, appetite change, fatigue, fever and unexpected weight change.  HENT: Negative for congestion, ear pain, rhinorrhea, sinus pressure and sore throat.   Eyes: Negative for pain, redness and visual  disturbance.  Respiratory: Negative for cough, shortness of breath and wheezing.   Cardiovascular: Negative for chest pain, palpitations and leg swelling.  Gastrointestinal: Negative for abdominal pain, blood in stool, constipation and diarrhea.  Endocrine: Negative for polydipsia and polyuria.  Genitourinary: Negative for dysuria, frequency and urgency.  Musculoskeletal: Negative for arthralgias, back pain and myalgias.  Skin: Negative for pallor and rash.  Allergic/Immunologic: Negative for environmental allergies.  Neurological: Negative for dizziness, syncope and headaches.  Hematological: Negative for adenopathy. Does not bruise/bleed easily.  Psychiatric/Behavioral: Negative for decreased concentration and dysphoric mood. The patient is nervous/anxious.        Objective:   Physical Exam Constitutional:      General: She is not in acute distress.    Appearance: She is well-developed.  HENT:     Head: Normocephalic and atraumatic.  Eyes:     Conjunctiva/sclera: Conjunctivae normal.     Pupils: Pupils are equal, round, and reactive to light.  Neck:     Thyroid: No thyromegaly.     Vascular: No carotid bruit or JVD.     Comments: Prominent thyroid  Cardiovascular:     Rate and Rhythm: Normal rate and regular rhythm.     Heart sounds: Normal heart sounds. No gallop.   Pulmonary:     Effort: Pulmonary effort is normal. No respiratory distress.     Breath sounds: Normal breath sounds. No wheezing or rales.  Abdominal:     General: Bowel sounds are normal. There is no distension or abdominal bruit.     Palpations: Abdomen is soft. There is no mass.     Tenderness: There is no abdominal tenderness.  Musculoskeletal:     Cervical back: Normal range of motion and neck supple.  Lymphadenopathy:     Cervical: No cervical adenopathy.  Skin:    General: Skin is warm and dry.     Findings: No rash.  Neurological:     Mental Status: She is alert. Mental status is at baseline.      Cranial Nerves: No cranial nerve deficit.     Coordination: Coordination normal.     Deep Tendon Reflexes: Reflexes are normal and symmetric.  Psychiatric:        Mood and Affect: Mood normal.           Assessment & Plan:   Problem List Items Addressed This Visit      Cardiovascular and Mediastinum   HYPERTENSION, BENIGN ESSENTIAL - Primary    bp in fair control at this time  BP Readings from Last 1 Encounters:  03/12/20 130/80   No changes needed Most recent labs reviewed  Disc lifstyle change with  low sodium diet and exercise  Tolerating amlodipine well and no longer has leg pain  Starting visits to healthy wt clinic  Rev exercise          Other   Enlarged uterus    Seeing gyn =reviewed notes MRI upcoming for this and poss enl ovary  perimenopausal      Elevated glucose level    Lab Results  Component Value Date   HGBA1C 5.3 09/05/2013  last glucose was 103 fasting disc imp of low glycemic diet and wt loss to prevent DM2

## 2020-03-12 NOTE — Telephone Encounter (Signed)
Spoke with nurse Jocelyn at [facility] and received order #[id number] valid 03/12/20-09/07/20 for MRI pelvis with and without contrast.

## 2020-03-12 NOTE — Assessment & Plan Note (Signed)
Lab Results  Component Value Date   HGBA1C 5.3 09/05/2013  last glucose was 103 fasting disc imp of low glycemic diet and wt loss to prevent DM2

## 2020-03-14 ENCOUNTER — Encounter: Payer: Self-pay | Admitting: Gynecology

## 2020-03-21 ENCOUNTER — Ambulatory Visit (INDEPENDENT_AMBULATORY_CARE_PROVIDER_SITE_OTHER): Payer: BC Managed Care – PPO | Admitting: Family Medicine

## 2020-03-21 ENCOUNTER — Encounter (INDEPENDENT_AMBULATORY_CARE_PROVIDER_SITE_OTHER): Payer: Self-pay | Admitting: Family Medicine

## 2020-03-21 ENCOUNTER — Other Ambulatory Visit: Payer: Self-pay

## 2020-03-21 VITALS — BP 142/90 | HR 68 | Temp 98.4°F | Ht 68.0 in | Wt 290.0 lb

## 2020-03-21 DIAGNOSIS — Z9189 Other specified personal risk factors, not elsewhere classified: Secondary | ICD-10-CM | POA: Diagnosis not present

## 2020-03-21 DIAGNOSIS — Z0289 Encounter for other administrative examinations: Secondary | ICD-10-CM

## 2020-03-21 DIAGNOSIS — F419 Anxiety disorder, unspecified: Secondary | ICD-10-CM

## 2020-03-21 DIAGNOSIS — R7301 Impaired fasting glucose: Secondary | ICD-10-CM | POA: Diagnosis not present

## 2020-03-21 DIAGNOSIS — N926 Irregular menstruation, unspecified: Secondary | ICD-10-CM

## 2020-03-21 DIAGNOSIS — R5383 Other fatigue: Secondary | ICD-10-CM | POA: Diagnosis not present

## 2020-03-21 DIAGNOSIS — R0602 Shortness of breath: Secondary | ICD-10-CM

## 2020-03-21 DIAGNOSIS — F329 Major depressive disorder, single episode, unspecified: Secondary | ICD-10-CM | POA: Diagnosis not present

## 2020-03-21 DIAGNOSIS — Z6841 Body Mass Index (BMI) 40.0 and over, adult: Secondary | ICD-10-CM

## 2020-03-21 DIAGNOSIS — I10 Essential (primary) hypertension: Secondary | ICD-10-CM

## 2020-03-21 DIAGNOSIS — F32A Depression, unspecified: Secondary | ICD-10-CM

## 2020-03-22 LAB — TSH: TSH: 2.09 u[IU]/mL (ref 0.450–4.500)

## 2020-03-22 LAB — CBC WITH DIFFERENTIAL/PLATELET
Basophils Absolute: 0 10*3/uL (ref 0.0–0.2)
Basos: 1 %
EOS (ABSOLUTE): 0.1 10*3/uL (ref 0.0–0.4)
Eos: 2 %
Hemoglobin: 13.7 g/dL (ref 11.1–15.9)
Immature Grans (Abs): 0 10*3/uL (ref 0.0–0.1)
Immature Granulocytes: 0 %
Lymphocytes Absolute: 1.9 10*3/uL (ref 0.7–3.1)
Lymphs: 28 %
MCH: 28.8 pg (ref 26.6–33.0)
MCHC: 33.7 g/dL (ref 31.5–35.7)
MCV: 86 fL (ref 79–97)
Monocytes Absolute: 0.5 10*3/uL (ref 0.1–0.9)
Monocytes: 8 %
Neutrophils Absolute: 4 10*3/uL (ref 1.4–7.0)
Neutrophils: 61 %
Platelets: 260 10*3/uL (ref 150–450)
RBC: 4.75 x10E6/uL (ref 3.77–5.28)
RDW: 13 % (ref 11.7–15.4)
WBC: 6.6 10*3/uL (ref 3.4–10.8)

## 2020-03-22 LAB — ANEMIA PANEL
Ferritin: 26 ng/mL (ref 15–150)
Folate, Hemolysate: 251 ng/mL
Folate, RBC: 617 ng/mL (ref >498)
Hematocrit: 40.7 % (ref 34.0–46.6)
Iron Saturation: 23 % (ref 15–55)
Iron: 70 ug/dL (ref 27–159)
Retic Ct Pct: 1 % (ref 0.6–2.6)
Total Iron Binding Capacity: 298 ug/dL (ref 250–450)
UIBC: 228 ug/dL (ref 131–425)
Vitamin B-12: 355 pg/mL (ref 232–1245)

## 2020-03-22 LAB — HEMOGLOBIN A1C
Est. average glucose Bld gHb Est-mCnc: 108 mg/dL
Hgb A1c MFr Bld: 5.4 % (ref 4.8–5.6)

## 2020-03-22 LAB — COMPREHENSIVE METABOLIC PANEL
ALT: 17 IU/L (ref 0–32)
AST: 17 IU/L (ref 0–40)
Albumin/Globulin Ratio: 1.6 (ref 1.2–2.2)
Albumin: 4.6 g/dL (ref 3.8–4.8)
Alkaline Phosphatase: 124 IU/L — ABNORMAL HIGH (ref 48–121)
BUN/Creatinine Ratio: 19 (ref 9–23)
BUN: 13 mg/dL (ref 6–24)
Bilirubin Total: 0.4 mg/dL (ref 0.0–1.2)
CO2: 24 mmol/L (ref 20–29)
Calcium: 9.5 mg/dL (ref 8.7–10.2)
Chloride: 103 mmol/L (ref 96–106)
Creatinine, Ser: 0.7 mg/dL (ref 0.57–1.00)
GFR calc Af Amer: 118 mL/min/{1.73_m2} (ref >59)
GFR calc non Af Amer: 102 mL/min/{1.73_m2} (ref >59)
Globulin, Total: 2.8 g/dL (ref 1.5–4.5)
Glucose: 99 mg/dL (ref 65–99)
Potassium: 4.1 mmol/L (ref 3.5–5.2)
Sodium: 140 mmol/L (ref 134–144)
Total Protein: 7.4 g/dL (ref 6.0–8.5)

## 2020-03-22 LAB — VITAMIN D 25 HYDROXY (VIT D DEFICIENCY, FRACTURES): Vit D, 25-Hydroxy: 39.8 ng/mL (ref 30.0–100.0)

## 2020-03-22 LAB — T4, FREE: Free T4: 1.3 ng/dL (ref 0.82–1.77)

## 2020-03-22 LAB — INSULIN, RANDOM: INSULIN: 8.9 u[IU]/mL (ref 2.6–24.9)

## 2020-03-22 LAB — T3: T3, Total: 139 ng/dL (ref 71–180)

## 2020-03-25 NOTE — Progress Notes (Signed)
Chief Complaint:   OBESITY Alexa Anderson (MR# [id number]) is a 49 y.o. female who presents for evaluation and treatment of obesity and related comorbidities. Current BMI is Body mass index is 44.09 kg/m. Alexa Anderson has been struggling with her weight for many years and has been unsuccessful in either losing weight, maintaining weight loss, or reaching her healthy weight goal.  [name] is currently in the action stage of change and ready to dedicate time achieving and maintaining a healthier weight. [name] is interested in becoming our patient and working on intensive lifestyle modifications including (but not limited to) diet and exercise for weight loss.  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  She works 40 hours per week.  She is divorced with an older son.  She wears a fitness tracker.  She says she did well with Weight Watchers and a nutritionist in the past.  [name]'s habits were reviewed today and are as follows: her desired weight loss is 90+ pounds, she has been heavy most of her life, she started gaining weight in 01/03/1996 when her father died, her heaviest weight ever was 400+ pounds, she craves bread, pasta, and doritos, she skips meals frequently, she is frequently drinking liquids with calories, she frequently makes poor food choices, she has problems with excessive hunger, she frequently eats larger portions than normal and she struggles with emotional eating.  Depression Screen [name]'s Food and Mood (modified PHQ-9) score was 10.  Depression screen PHQ 2/9 03/21/2020  Decreased Interest 1  Down, Depressed, Hopeless 2  PHQ - 2 Score 3  Altered sleeping 2  Tired, decreased energy 1  Change in appetite 2  Feeling bad or failure about yourself  1  Trouble concentrating 1  Moving slowly or fidgety/restless 0  Suicidal thoughts 0  PHQ-9 Score 10  Difficult doing work/chores Not difficult at all   Subjective:   1. Other fatigue Alexa Anderson denies daytime somnolence  and reports waking up still tired. Patent has a history of symptoms of morning fatigue and morning headache. Alexa Anderson generally gets 6 hours of sleep per night, and states that she has generally restful sleep. Snoring is not present. Apneic episodes are not present. Epworth Sleepiness Score is 4.  2. SOB (shortness of breath) on exertion Alexa Anderson notes increasing shortness of breath with exercising and seems to be worsening over time with weight gain. She notes getting out of breath sooner with activity than she used to. This has not gotten worse recently. Alexa Anderson denies shortness of breath at rest or orthopnea.  3. Essential hypertension Review: taking medications as instructed, no medication side effects noted, no chest pain on exertion, no dyspnea on exertion, no swelling of ankles.  Blood pressure is elevated today at 142/90.  She is taking Norvasc 5 mg daily.  BP Readings from Last 3 Encounters:  03/21/20 (!) 142/90  03/12/20 130/80  02/21/20 134/88   4. Fasting hyperglycemia [name] has a history of some elevated blood glucose readings without a diagnosis of diabetes. She denies polyphagia.  5. Irregular menses [name] sees [name] for GYN care.  MRI pelvis is pending.  6. Anxiety and depression, with emotional eating Alexa Anderson is struggling with emotional eating and using food for comfort to the extent that it is negatively impacting her health. She has been working on behavior modification techniques to help reduce her emotional eating and has been unsuccessful. She shows no sign of suicidal or homicidal ideations.  PHQ-9 is 10 today.  7.  At risk for deficient intake of food The patient is at a higher than average risk of deficient intake of food.  Assessment/Plan:   1. Other fatigue Alexa Anderson does feel that her weight is causing her energy to be lower than it should be. Fatigue may be related to obesity, depression or many other causes. Labs will be ordered, and in the meanwhile, Alexa Anderson  will focus on self care including making healthy food choices, increasing physical activity and focusing on stress reduction.  Orders - EKG 12-Lead - VITAMIN D 25 Hydroxy (Vit-D Deficiency, Fractures) - TSH - T4, free - T3  2. SOB (shortness of breath) on exertion Alexa Anderson does not feel that she gets out of breath more easily that she used to when she exercises. Alexa Anderson's shortness of breath appears to be obesity related and exercise induced. She has agreed to work on weight loss and gradually increase exercise to treat her exercise induced shortness of breath. Will continue to monitor closely.  3. Essential hypertension Alexa Anderson is working on healthy weight loss and exercise to improve blood pressure control. We will watch for signs of hypotension as she continues her lifestyle modifications.  Orders - Comprehensive metabolic panel - CBC with Differential/Platelet - Anemia panel - Hemoglobin A1c - Insulin, random - VITAMIN D 25 Hydroxy (Vit-D Deficiency, Fractures) - TSH - T4, free - T3  4. Fasting hyperglycemia Fasting labs will be obtained and results with be discussed with Alexa Anderson in 2 weeks at her follow up visit. In the meanwhile Alexa Anderson was started on a lower simple carbohydrate diet and will work on weight loss efforts.  Orders - Comprehensive metabolic panel - CBC with Differential/Platelet - Anemia panel - Hemoglobin A1c - Insulin, random - VITAMIN D 25 Hydroxy (Vit-D Deficiency, Fractures) - TSH - T4, free - T3  5. Irregular menses Will continue to follow.  6. Anxiety and depression, with emotional eating Will send a message to Dr. Alene Mires.  Behavior modification techniques were discussed today to help Jaeleen deal with her emotional/non-hunger eating behaviors.  Orders and follow up as documented in patient record.   7. At risk for deficient intake of food [name] was given approximately 15 minutes of deficit intake of food prevention counseling today. Alexa Anderson is at  risk for eating too few calories based on current food recall. She was encouraged to focus on meeting caloric and protein goals according to her recommended meal plan.   8. Class 3 severe obesity with serious comorbidity and body mass index (BMI) of 40.0 to 44.9 in adult, unspecified obesity type (HCC) Damian is currently in the action stage of change and her goal is to continue with weight loss efforts. I recommend Maripaz begin the structured treatment plan as follows:  She has agreed to the Category 4 Plan.  Exercise goals: No exercise has been prescribed at this time.   Behavioral modification strategies: increasing lean protein intake, decreasing simple carbohydrates, increasing vegetables, increasing water intake, decreasing sodium intake and increasing high fiber foods.  She was informed of the importance of frequent follow-up visits to maximize her success with intensive lifestyle modifications for her multiple health conditions. She was informed we would discuss her lab results at her next visit unless there is a critical issue that needs to be addressed sooner. Gladyce agreed to keep her next visit at the agreed upon time to discuss these results.  Objective:   Blood pressure (!) 142/90, pulse 68, temperature 98.4 F (36.9 C), temperature source Oral, height 5\' 8"  (  1.727 m), weight 290 lb (131.5 kg), last menstrual period 02/21/2020, SpO2 99 %. Body mass index is 44.09 kg/m.  EKG: Normal sinus rhythm, rate 71 bpm.  Indirect Calorimeter completed today shows a VO2 of 330 and a REE of 2300.  Her calculated basal metabolic rate is 5732 thus her basal metabolic rate is better than expected.  General: Cooperative, alert, well developed, in no acute distress. HEENT: Conjunctivae and lids unremarkable. Cardiovascular: Regular rhythm.  Lungs: Normal work of breathing. Neurologic: No focal deficits.   Lab Results  Component Value Date   CREATININE 0.70 03/21/2020   BUN 13 03/21/2020    NA 140 03/21/2020   K 4.1 03/21/2020   CL 103 03/21/2020   CO2 24 03/21/2020   Lab Results  Component Value Date   ALT 17 03/21/2020   AST 17 03/21/2020   ALKPHOS 124 (H) 03/21/2020   BILITOT 0.4 03/21/2020   Lab Results  Component Value Date   HGBA1C 5.4 03/21/2020   HGBA1C 5.3 09/05/2013   Lab Results  Component Value Date   INSULIN 8.9 03/21/2020   Lab Results  Component Value Date   TSH 2.090 03/21/2020   Lab Results  Component Value Date   CHOL 172 02/07/2020   HDL 57.10 02/07/2020   LDLCALC 102 (H) 02/07/2020   TRIG 66.0 02/07/2020   CHOLHDL 3 02/07/2020   Lab Results  Component Value Date   WBC 6.6 03/21/2020   HGB 13.7 03/21/2020   HCT 40.7 03/21/2020   MCV 86 03/21/2020   PLT 260 03/21/2020   Lab Results  Component Value Date   IRON 70 03/21/2020   TIBC 298 03/21/2020   FERRITIN 26 03/21/2020   Attestation Statements:   This is the patient's first visit at [facility]. The patient's NEW PATIENT PACKET was reviewed at length. Included in the packet: current and past health history, medications, allergies, ROS, gynecologic history (women only), surgical history, family history, social history, weight history, weight loss surgery history (for those that have had weight loss surgery), nutritional evaluation, mood and food questionnaire, PHQ9, Epworth questionnaire, sleep habits questionnaire, patient life and health improvement goals questionnaire. These will all be scanned into the patient's chart under media.   During the visit, I independently reviewed the patient's EKG, bioimpedance scale results, and indirect calorimeter results. I used this information to tailor a meal plan for the patient that will help her to lose weight and will improve her obesity-related conditions going forward. I performed a medically necessary appropriate examination and/or evaluation. I discussed the assessment and treatment plan with the patient. The patient was  provided an opportunity to ask questions and all were answered. The patient agreed with the plan and demonstrated an understanding of the instructions. Labs were ordered at this visit and will be reviewed at the next visit unless more critical results need to be addressed immediately. Clinical information was updated and documented in the EMR.   I, Water quality scientist, CMA, am acting as transcriptionist for Briscoe Deutscher, DO  I have reviewed the above documentation for accuracy and completeness, and I agree with the above. Briscoe Deutscher, DO

## 2020-03-26 ENCOUNTER — Ambulatory Visit (INDEPENDENT_AMBULATORY_CARE_PROVIDER_SITE_OTHER): Payer: BC Managed Care – PPO | Admitting: Psychology

## 2020-03-26 DIAGNOSIS — F3289 Other specified depressive episodes: Secondary | ICD-10-CM | POA: Diagnosis not present

## 2020-03-28 ENCOUNTER — Ambulatory Visit
Admission: RE | Admit: 2020-03-28 | Discharge: 2020-03-28 | Disposition: A | Discharge status: Home / Self Care | Payer: BC Managed Care – PPO | Source: Ambulatory Visit | Attending: Obstetrics and Gynecology | Admitting: Obstetrics and Gynecology

## 2020-03-28 ENCOUNTER — Other Ambulatory Visit: Payer: Self-pay

## 2020-03-28 DIAGNOSIS — N852 Hypertrophy of uterus: Secondary | ICD-10-CM

## 2020-03-28 DIAGNOSIS — D252 Subserosal leiomyoma of uterus: Secondary | ICD-10-CM | POA: Diagnosis not present

## 2020-03-28 MED ORDER — GADOBENATE DIMEGLUMINE 529 MG/ML IV SOLN
20.0000 mL | Freq: Once | INTRAVENOUS | Status: AC | PRN
  Administered 2020-03-28: 20 mL via INTRAVENOUS

## 2020-03-28 NOTE — Progress Notes (Signed)
Please tell the patient I reviewed the pelvic MRI and it looks like the uterus is very enlarged and the pelvic growth is actually a very large uterine fibroid (instead of an ovarian cyst).  Given the very large size of the uterine mass, she may want to consider having it surgically removed (the fibroid only or the whole uterus) to make sure it is benign.

## 2020-04-02 ENCOUNTER — Other Ambulatory Visit: Payer: Self-pay

## 2020-04-03 ENCOUNTER — Encounter: Payer: Self-pay | Admitting: Obstetrics and Gynecology

## 2020-04-03 ENCOUNTER — Ambulatory Visit (INDEPENDENT_AMBULATORY_CARE_PROVIDER_SITE_OTHER): Payer: BC Managed Care – PPO | Admitting: Obstetrics and Gynecology

## 2020-04-03 VITALS — BP 134/90

## 2020-04-03 DIAGNOSIS — N852 Hypertrophy of uterus: Secondary | ICD-10-CM

## 2020-04-03 DIAGNOSIS — N858 Other specified noninflammatory disorders of uterus: Secondary | ICD-10-CM

## 2020-04-03 NOTE — Progress Notes (Signed)
Alexa Anderson 29-Jul-1971 [id number]  SUBJECTIVE:  49 y.o. G64P1011 female presents for follow-up discussion regarding her enlarged fibroid uterus and pelvic MRI results.  She was in last month as a referral for irregular menstruation and a pelvic ultrasound which showed a very enlarged uterus with question of a possible adnexal mass versus fibroid.  The decision was made to proceed with a pelvic MRI due to inability to clearly distinguish the pelvic organ features on the ultrasound.  Her Gordon has been in the borderline range.  She has had irregular menstruation but no excessive bleeding.  Anything she describes her menstrual flow as lighter than she has been used to.  No abdominal bulk symptoms.  Working on making lifestyle changes to change her diet and lose weight.  Has been to a weight management clinic recently.   Current Outpatient Medications  Medication Sig Dispense Refill  . amLODipine (NORVASC) 5 MG tablet Take 1 tablet (5 mg total) by mouth daily. 30 tablet 11   No current facility-administered medications for this visit.   Allergies: Bee venom  No LMP recorded. (Menstrual status: Irregular Periods).  Past medical history,surgical history, problem list, medications, allergies, family history and social history were all reviewed and documented as reviewed in the EPIC chart.   OBJECTIVE:  BP 134/90 (Cuff Size: Large)  The patient appears well, alert, oriented x 3, in no distress. PELVIC EXAM: Deferred as this was performed at her last visit  Pelvic MRI shows a enlarged uterus 18.9 x 9.7 x 13.9 cm.  Large subserosal fibroid is in the right lateral uterine fundal area measuring 15.8 x 9.7 x 12.9 cm.  Additionally a 1 cm intramural fibroid is present in the right fundus.  Endometrium appears to be normal thickness.  Right ovary not visualized, left ovary appears normal.  No pathologic appearing pelvic lymph nodes are identified.   ASSESSMENT:  49 y.o. G2P1011 with an enlarged  uterine mass  PLAN:  We showed pictures of the MRI images to illustrate the bulky size of her uterus.  It is remarkable that she reports minimal to no symptoms that would typically result from uterine bulk.  She is not having any excessive menstrual bleeding, just irregular menstrual bleeding that is more likely part of the menopausal transition.  We discussed that we cannot be certain that the uterine mass is benign, although leiomyosarcoma is relatively rare.  The only way to know for certain would be to perform surgical excision of the fibroid/myomectomy or a hysterectomy so we could have pathology analyze the specimen for a diagnosis.  She would would prefer to wait on having surgery with the understanding of small chance of a malignancy being present. For what it is worth, there were no suspicious pelvic lymph nodes seen per the radiology report.   Management options for this uterine mass would be referral to IR for consideration of uterine artery embolization, myomectomy, or hysterectomy.  We could also consider medical management with a GnRH antagonist or agonist which may only temporarily shrink the fibroid, and really would be best used in the setting of preoperative planning.  She also will be heading into menopause at some point, so if this is indeed a benign fibroid this should shrink in time naturally.  She is not having any excessive bleeding and is relatively asymptomatic so it is not necessarily imperative to intervene right away.  I also offered her a surveillance option where we could have her back for a surveillance pelvic exam in  2 months or so to see if there has been any interval growth based on the pelvic exam.  It sounds like she did have to pay quite a bit out-of-pocket for the MRI so financial feasibility of repeating the MRI for surveillance purposes may not be great.  Certainly if there is demonstrated growth of the uterine mass on her surveillance exams, or interval development  of uterine bulk symptoms/abnormal uterine bleeding, I would more strongly encourage her in those cases to proceed with surgical excision of the fibroids/hysterectomy.  We briefly discussed today the considerations for having an open abdominal hysterectomy/myomectomy including the expected recovery time and need for time off work.  We touched on concepts including ability to perform Pfannenstiel incision up to a certain uterine size at which it may be necessary to instead do a midline vertical incision if there is much more growth of the fibroid.  Understanding all of the above she will plan to return for an exam in about 6 to 8 weeks.   Joseph Pierini MD 04/03/20

## 2020-04-04 ENCOUNTER — Other Ambulatory Visit: Payer: Self-pay

## 2020-04-04 ENCOUNTER — Ambulatory Visit (INDEPENDENT_AMBULATORY_CARE_PROVIDER_SITE_OTHER): Payer: BC Managed Care – PPO | Admitting: Family Medicine

## 2020-04-04 ENCOUNTER — Encounter (INDEPENDENT_AMBULATORY_CARE_PROVIDER_SITE_OTHER): Payer: Self-pay | Admitting: Family Medicine

## 2020-04-04 VITALS — BP 136/84 | HR 67 | Temp 98.1°F | Ht 68.0 in | Wt 284.0 lb

## 2020-04-04 DIAGNOSIS — Z9189 Other specified personal risk factors, not elsewhere classified: Secondary | ICD-10-CM

## 2020-04-04 DIAGNOSIS — Z6841 Body Mass Index (BMI) 40.0 and over, adult: Secondary | ICD-10-CM

## 2020-04-04 DIAGNOSIS — D259 Leiomyoma of uterus, unspecified: Secondary | ICD-10-CM | POA: Diagnosis not present

## 2020-04-04 DIAGNOSIS — R790 Abnormal level of blood mineral: Secondary | ICD-10-CM | POA: Diagnosis not present

## 2020-04-04 DIAGNOSIS — E538 Deficiency of other specified B group vitamins: Secondary | ICD-10-CM

## 2020-04-04 DIAGNOSIS — I10 Essential (primary) hypertension: Secondary | ICD-10-CM | POA: Diagnosis not present

## 2020-04-04 DIAGNOSIS — F3289 Other specified depressive episodes: Secondary | ICD-10-CM

## 2020-04-04 NOTE — Progress Notes (Signed)
Chief Complaint:   OBESITY Alexa Anderson is here to discuss her progress with her obesity treatment plan along with follow-up of her obesity related diagnoses. Alexa Anderson is on the Category 4 Plan and states she is following her eating plan approximately 100% of the time. Alexa Anderson states she is walking for 20-30 minutes 3-4 times per week.  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  She made great choices.  We reviewed Lose It log.  She is working on healthy lifestyle changes in general.  She says she is thinking about changing jobs.  Her adult son is very supportive and is a great weight loss partner.  Subjective:   1. Low ferritin  CBC Latest Ref Rng & Units 03/21/2020 02/07/2020 12/14/2017  WBC 3.4 - 10.8 x10E3/uL 6.6 6.0 8.0  Hemoglobin 11.1 - 15.9 g/dL 13.7 13.0 13.6  Hematocrit 34.0 - 46.6 % 40.7 38.4 40.3  Platelets 150 - 450 x10E3/uL 260 257.0 276.0   Lab Results  Component Value Date   IRON 70 03/21/2020   TIBC 298 03/21/2020   FERRITIN 26 03/21/2020   Lab Results  Component Value Date   VITAMINB12 355 03/21/2020   2. Uterine leiomyoma Alexa Anderson is followed by GYN.  Note reviewed.  3. B12 deficiency She is not a vegetarian.  She does not have a previous diagnosis of pernicious anemia.  She does not have a history of weight loss surgery.   Lab Results  Component Value Date   QASTMHDQ22 297 03/21/2020   4. Essential hypertension Review: taking medications as instructed, no medication side effects noted, no chest pain on exertion, no dyspnea on exertion, no swelling of ankles.  She is on Norvasc.  Blood pressure is at goal today.  BP Readings from Last 3 Encounters:  04/04/20 136/84  04/03/20 134/90  03/21/20 (!) 142/90   5. Other  depression, with emotional eating Alexa Anderson is struggling with emotional eating and using food for comfort to the extent that it is negatively impacting her health. She has been working on behavior modification techniques to help reduce her emotional eating and has been successful. She shows no sign of suicidal or homicidal ideations.  6. At risk for complication associated with hypotension Alexa Anderson is at risk for complications associated with hypotension due to weight loss.   Assessment/Plan:   1. Low ferritin Orders and follow up as documented in patient record.  Counseling . Iron is essential for our bodies to make red blood cells.  Reasons that someone may be deficient include: an iron-deficient diet (more likely in those following vegan or vegetarian diets), women with heavy menses, patients with GI disorders or poor absorption, patients that have had bariatric surgery, frequent blood donors, patients with cancer, and patients with heart disease.   Alexa Anderson foods include dark leafy greens, red and white meats, eggs, seafood, and beans.   . Certain foods and drinks prevent your body from absorbing iron properly. Avoid eating these foods in the same meal as iron-rich foods or with iron supplements. These foods include: coffee, black tea, and red wine; milk, dairy products, and foods that are high in calcium; beans and soybeans; whole grains.  . Constipation can be a side effect of iron supplementation. Increased water and  fiber intake are helpful. Water goal: > 2 liters/day. Fiber goal: > 25 grams/day.  2. Uterine leiomyoma, unspecified location Will continue to follow along.  3. B12 deficiency The diagnosis was reviewed with the patient. Counseling provided today, see below. We will continue to monitor. Orders and follow up as documented in patient record.  Reviewed foods high in vitamin B12.  Counseling . The body needs vitamin B12: to make red blood cells; to make DNA; and to help the  nerves work properly so they can carry messages from the brain to the body.  . The main causes of vitamin B12 deficiency include dietary deficiency, digestive diseases, pernicious anemia, and having a surgery in which part of the stomach or small intestine is removed.  . Certain medicines can make it harder for the body to absorb vitamin B12. These medicines include: heartburn medications; some antibiotics; some medications used to treat diabetes, gout, and high cholesterol.  . In some cases, there are no symptoms of this condition. If the condition leads to anemia or nerve damage, various symptoms can occur, such as weakness or fatigue, shortness of breath, and numbness or tingling in your hands and feet.   . Treatment:  o May include taking vitamin B12 supplements.  o Avoid alcohol.  o Eat lots of healthy foods that contain vitamin B12: - Beef, pork, chicken, [place], and organ meats, such as liver.  - Seafood: This includes clams, rainbow trout, salmon, tuna, and haddock. Eggs.  - Cereal and dairy products that are fortified: This means that vitamin B12 has been added to the food.   4. Essential hypertension Alexa Anderson is working on healthy weight loss and exercise to improve blood pressure control. We will watch for signs of hypotension as she continues her lifestyle modifications.  5. Other depression, with emotional eating Behavior modification techniques were discussed today to help Alexa Anderson deal with her emotional/non-hunger eating behaviors.  Orders and follow up as documented in patient record.   6. At risk for complication associated with hypotension Alexa Anderson was given approximately 15 minutes of education and counseling today to help avoid hypotension. We discussed risks of hypotension with weight loss and signs of hypotension such as feeling lightheaded or unsteady.  Repetitive spaced learning was employed today to elicit superior memory formation and behavioral change.  7. Class 3 severe  obesity with serious comorbidity and body mass index (BMI) of 40.0 to 44.9 in adult, unspecified obesity type (HCC) Alexa Anderson is currently in the action stage of change. As such, her goal is to continue with weight loss efforts. She has agreed to keeping a food journal and adhering to recommended goals of 1800 calories and 125+ grams of protein.   Exercise goals: As is.  Behavioral modification strategies: increasing lean protein intake and increasing water intake.  Alexa Anderson has agreed to follow-up with our clinic in 2-3 weeks. She was informed of the importance of frequent follow-up visits to maximize her success with intensive lifestyle modifications for her multiple health conditions.   Objective:   Blood pressure 136/84, pulse 67, temperature 98.1 F (36.7 C), temperature source Oral, height 5\' 8"  (1.727 m), weight 284 lb (128.8 kg), SpO2 98 %. Body mass index is 43.18 kg/m.  General: Cooperative, alert, well developed, in no acute distress. HEENT: Conjunctivae and lids unremarkable. Cardiovascular: Regular rhythm.  Lungs: Normal work of breathing. Neurologic: No focal deficits.   Lab Results  Component Value Date   CREATININE 0.70 03/21/2020   BUN 13 03/21/2020   NA  140 03/21/2020   K 4.1 03/21/2020   CL 103 03/21/2020   CO2 24 03/21/2020   Lab Results  Component Value Date   ALT 17 03/21/2020   AST 17 03/21/2020   ALKPHOS 124 (H) 03/21/2020   BILITOT 0.4 03/21/2020   Lab Results  Component Value Date   HGBA1C 5.4 03/21/2020   HGBA1C 5.3 09/05/2013   Lab Results  Component Value Date   INSULIN 8.9 03/21/2020   Lab Results  Component Value Date   TSH 2.090 03/21/2020   Lab Results  Component Value Date   CHOL 172 02/07/2020   HDL 57.10 02/07/2020   LDLCALC 102 (H) 02/07/2020   TRIG 66.0 02/07/2020   CHOLHDL 3 02/07/2020   Lab Results  Component Value Date   WBC 6.6 03/21/2020   HGB 13.7 03/21/2020   HCT 40.7 03/21/2020   MCV 86 03/21/2020   PLT 260  03/21/2020   Lab Results  Component Value Date   IRON 70 03/21/2020   TIBC 298 03/21/2020   FERRITIN 26 03/21/2020   Attestation Statements:   Reviewed by clinician on day of visit: allergies, medications, problem list, medical history, surgical history, family history, social history, and previous encounter notes.  I, Water quality scientist, CMA, am acting as transcriptionist for Briscoe Deutscher, DO  I have reviewed the above documentation for accuracy and completeness, and I agree with the above. Briscoe Deutscher, DO

## 2020-04-09 ENCOUNTER — Ambulatory Visit (INDEPENDENT_AMBULATORY_CARE_PROVIDER_SITE_OTHER): Payer: BC Managed Care – PPO | Admitting: Psychology

## 2020-04-09 DIAGNOSIS — F3289 Other specified depressive episodes: Secondary | ICD-10-CM | POA: Diagnosis not present

## 2020-04-23 ENCOUNTER — Encounter (INDEPENDENT_AMBULATORY_CARE_PROVIDER_SITE_OTHER): Payer: Self-pay | Admitting: Family Medicine

## 2020-04-23 ENCOUNTER — Ambulatory Visit: Payer: BC Managed Care – PPO | Admitting: Psychology

## 2020-04-23 ENCOUNTER — Other Ambulatory Visit: Payer: Self-pay

## 2020-04-23 ENCOUNTER — Ambulatory Visit (INDEPENDENT_AMBULATORY_CARE_PROVIDER_SITE_OTHER): Payer: BC Managed Care – PPO | Admitting: Family Medicine

## 2020-04-23 VITALS — BP 137/84 | HR 69 | Temp 98.0°F | Ht 68.0 in | Wt 283.0 lb

## 2020-04-23 DIAGNOSIS — F3289 Other specified depressive episodes: Secondary | ICD-10-CM

## 2020-04-23 DIAGNOSIS — Z9189 Other specified personal risk factors, not elsewhere classified: Secondary | ICD-10-CM | POA: Diagnosis not present

## 2020-04-23 DIAGNOSIS — Z6841 Body Mass Index (BMI) 40.0 and over, adult: Secondary | ICD-10-CM

## 2020-04-23 DIAGNOSIS — I10 Essential (primary) hypertension: Secondary | ICD-10-CM

## 2020-04-23 MED ORDER — PROPRANOLOL HCL 20 MG PO TABS: 20.0000 mg | ORAL_TABLET | Freq: Three times a day (TID) | ORAL | 0 refills | Status: DC

## 2020-04-23 MED ORDER — ESCITALOPRAM OXALATE 10 MG PO TABS: 10.0000 mg | ORAL_TABLET | Freq: Every morning | ORAL | 0 refills | Status: DC

## 2020-04-23 NOTE — Progress Notes (Signed)
Chief Complaint:   OBESITY Alexa Anderson is here to discuss her progress with her obesity treatment plan along with follow-up of her obesity related diagnoses. Alexa Anderson is on keeping a food journal and adhering to recommended goals of 1800 calories and 125+ grams of protein and states she is following her eating plan approximately 85-90% of the time. Alexa Anderson states she is walking for 30 minutes 3-4 times per week.  Today's visit was #: 3 Starting weight: 290 lbs Starting date: 03/21/2020 Today's weight: 283 lbs Today's date: 04/23/2020 Total lbs lost to date: 7 lbs Total lbs lost since last in-office visit: 1 lb  Interim History: Alexa Anderson went to [facility] for work.  She says she tried to make good decisions.  She stayed out 1 night and had alcohol, but hung out with the band [place].  She is now back home and on plan. She says that work is still stressful.  She works in [employer].  She says she is afraid to need to go out for GYN issues.  She does not have supportive bosses.  Subjective:   1. Essential hypertension Review: taking medications as instructed, no medication side effects noted, no chest pain on exertion, no dyspnea on exertion, no swelling of ankles.  She is taking amlodipine.  BP Readings from Last 3 Encounters:  04/23/20 137/84  04/04/20 136/84  04/03/20 134/90   2. Other depression, with emotional eating Alexa Anderson is struggling with emotional eating and using food for comfort to the extent that it is negatively impacting her health. She has been working on behavior modification techniques to help reduce her emotional eating and has been minimally successful. She shows no sign of suicidal or homicidal ideations.  She is on Lexapro and propranolol.  3. At risk for heart disease Alexa Anderson is at a higher than average risk for cardiovascular disease due to obesity.   Assessment/Plan:   1. Essential hypertension Alexa Anderson is working on healthy weight loss and exercise to improve  blood pressure control. We will watch for signs of hypotension as she continues her lifestyle modifications.  2. Other depression, with emotional eating Behavior modification techniques were discussed today to help Alexa Anderson deal with her emotional/non-hunger eating behaviors.  Orders and follow up as documented in patient record.  Will refill his medications, as per below.  Orders - escitalopram (LEXAPRO) 10 MG tablet; Take 1 tablet (10 mg total) by mouth in the morning.  Dispense: 30 tablet; Refill: 0 - propranolol (INDERAL) 20 MG tablet; Take 1 tablet (20 mg total) by mouth 3 (three) times daily.  Dispense: 20 tablet; Refill: 0  3. At risk for heart disease Alexa Anderson was given approximately 15 minutes of coronary artery disease prevention counseling today. She is 49 y.o. female and has risk factors for heart disease including obesity. We discussed intensive lifestyle modifications today with an emphasis on specific weight loss instructions and strategies.   Repetitive spaced learning was employed today to elicit superior memory formation and behavioral change.  4. Class 3 severe obesity with serious comorbidity and body mass index (BMI) of 40.0 to 44.9 in adult, unspecified obesity type (HCC) Alexa Anderson is currently in the action stage of change. As such, her goal is to continue with weight loss efforts. She has agreed to keeping a food journal and adhering to recommended goals of 1800 calories and 125 grams of protein.   Exercise goals: For substantial health benefits, adults should do at least 150 minutes (2 hours and 30 minutes) a  week of moderate-intensity, or 75 minutes (1 hour and 15 minutes) a week of vigorous-intensity aerobic physical activity, or an equivalent combination of moderate- and vigorous-intensity aerobic activity. Aerobic activity should be performed in episodes of at least 10 minutes, and preferably, it should be spread throughout the week.  Behavioral modification strategies:  increasing lean protein intake, decreasing sodium intake and increasing high fiber foods.  Alexa Anderson has agreed to follow-up with our clinic in 2-3 weeks. She was informed of the importance of frequent follow-up visits to maximize her success with intensive lifestyle modifications for her multiple health conditions.   Objective:   Blood pressure 137/84, pulse 69, temperature 98 F (36.7 C), temperature source Oral, height 5\' 8"  (1.727 m), weight 283 lb (128.4 kg), SpO2 98 %. Body mass index is 43.03 kg/m.  General: Cooperative, alert, well developed, in no acute distress. HEENT: Conjunctivae and lids unremarkable. Cardiovascular: Regular rhythm.  Lungs: Normal work of breathing. Neurologic: No focal deficits.   Lab Results  Component Value Date   CREATININE 0.70 03/21/2020   BUN 13 03/21/2020   NA 140 03/21/2020   K 4.1 03/21/2020   CL 103 03/21/2020   CO2 24 03/21/2020   Lab Results  Component Value Date   ALT 17 03/21/2020   AST 17 03/21/2020   ALKPHOS 124 (H) 03/21/2020   BILITOT 0.4 03/21/2020   Lab Results  Component Value Date   HGBA1C 5.4 03/21/2020   HGBA1C 5.3 09/05/2013   Lab Results  Component Value Date   INSULIN 8.9 03/21/2020   Lab Results  Component Value Date   TSH 2.090 03/21/2020   Lab Results  Component Value Date   CHOL 172 02/07/2020   HDL 57.10 02/07/2020   LDLCALC 102 (H) 02/07/2020   TRIG 66.0 02/07/2020   CHOLHDL 3 02/07/2020   Lab Results  Component Value Date   WBC 6.6 03/21/2020   HGB 13.7 03/21/2020   HCT 40.7 03/21/2020   MCV 86 03/21/2020   PLT 260 03/21/2020   Lab Results  Component Value Date   IRON 70 03/21/2020   TIBC 298 03/21/2020   FERRITIN 26 03/21/2020   Attestation Statements:   Reviewed by clinician on day of visit: allergies, medications, problem list, medical history, surgical history, family history, social history, and previous encounter notes.  I, Water quality scientist, CMA, am acting as transcriptionist for  Briscoe Deutscher, DO  I have reviewed the above documentation for accuracy and completeness, and I agree with the above. Briscoe Deutscher, DO

## 2020-05-07 ENCOUNTER — Ambulatory Visit: Payer: BC Managed Care – PPO | Admitting: Psychology

## 2020-05-08 ENCOUNTER — Other Ambulatory Visit: Payer: Self-pay

## 2020-05-08 ENCOUNTER — Ambulatory Visit (INDEPENDENT_AMBULATORY_CARE_PROVIDER_SITE_OTHER): Payer: BC Managed Care – PPO | Admitting: Family Medicine

## 2020-05-08 ENCOUNTER — Encounter (INDEPENDENT_AMBULATORY_CARE_PROVIDER_SITE_OTHER): Payer: Self-pay | Admitting: Family Medicine

## 2020-05-08 VITALS — BP 154/82 | HR 72 | Temp 98.0°F | Ht 68.0 in | Wt 279.0 lb

## 2020-05-08 DIAGNOSIS — F3289 Other specified depressive episodes: Secondary | ICD-10-CM

## 2020-05-08 DIAGNOSIS — I10 Essential (primary) hypertension: Secondary | ICD-10-CM

## 2020-05-08 DIAGNOSIS — Z9189 Other specified personal risk factors, not elsewhere classified: Secondary | ICD-10-CM | POA: Diagnosis not present

## 2020-05-08 DIAGNOSIS — Z6841 Body Mass Index (BMI) 40.0 and over, adult: Secondary | ICD-10-CM

## 2020-05-09 MED ORDER — PROPRANOLOL HCL 20 MG PO TABS: 20.0000 mg | ORAL_TABLET | Freq: Three times a day (TID) | ORAL | 0 refills | Status: DC

## 2020-05-09 MED ORDER — ESCITALOPRAM OXALATE 10 MG PO TABS: 10.0000 mg | ORAL_TABLET | Freq: Every morning | ORAL | 0 refills | Status: DC

## 2020-05-09 NOTE — Progress Notes (Signed)
Chief Complaint:   OBESITY Alexa Anderson is here to discuss her progress with her obesity treatment plan along with follow-up of her obesity related diagnoses. Alexa Anderson is on keeping a food journal and adhering to recommended goals of 1800 calories and 125 grams of protein and states she is following her eating plan approximately 98% of the time. Alexa Anderson states she is walking for 30+ minutes 3-4 times per week.  Today's visit was #: 4 Starting weight: 290 lbs Starting date: 03/21/2020 Today's weight: 279 lbs Today's date: 05/08/2020 Total lbs lost to date: 11 lbs Total lbs lost since last in-office visit: 4 lbs Total weight loss percentage to date: -3.79%  Interim History: Alexa Anderson says she is happy with the plan and her progress.  She will follow up with her GYN next week to discuss her large fibroids and whether or not surgery needs to happen soon. This has caused significant stress for her, so she is looking forward to the visit.   Subjective:   1. Essential hypertension Review: taking medications as instructed, no medication side effects noted, no chest pain on exertion, no dyspnea on exertion, no swelling of ankles.  Blood pressure is elevated today.  BP Readings from Last 3 Encounters:  05/08/20 (!) 154/82  04/23/20 137/84  04/04/20 136/84   2. Other depression, with emotional eating Alexa Anderson is struggling with emotional eating and using food for comfort to the extent that it is negatively impacting her health. She has been working on behavior modification techniques to help reduce her emotional eating and has been successful. She shows no sign of suicidal or homicidal ideations.  She is taking Lexapro and propranolol.  Assessment/Plan:   1. Essential hypertension Uncontrolled today. Alexa Anderson is working on healthy weight loss and exercise to improve blood pressure control. Lab results reviewed with patient. We will continue to monitor. Recommended sodium restriction.  2. Other  depression, with emotional eating Behavior modification techniques were discussed today to help Alexa Anderson deal with her emotional/non-hunger eating behaviors.  Orders and follow up as documented in patient record.   Orders - escitalopram (LEXAPRO) 10 MG tablet; Take 1 tablet (10 mg total) by mouth in the morning.  Dispense: 30 tablet; Refill: 0 - propranolol (INDERAL) 20 MG tablet; Take 1 tablet (20 mg total) by mouth 3 (three) times daily.  Dispense: 20 tablet; Refill: 0  3. At risk for heart disease Alexa Anderson was given approximately 15 minutes of coronary artery disease prevention counseling today. She is 49 y.o. female and has risk factors for heart disease including obesity and HTN. We discussed intensive lifestyle modifications today with an emphasis on specific weight loss instructions and strategies.   Repetitive spaced learning was employed today to elicit superior memory formation and behavioral change.  4. Class 3 severe obesity with serious comorbidity and body mass index (BMI) of 40.0 to 44.9 in adult, unspecified obesity type (HCC) Alexa Anderson is currently in the action stage of change. As such, her goal is to continue with weight loss efforts. She has agreed to keeping a food journal and adhering to recommended goals of 1800 calories and 125 grams of protein.   Exercise goals: For substantial health benefits, adults should do at least 150 minutes (2 hours and 30 minutes) a week of moderate-intensity, or 75 minutes (1 hour and 15 minutes) a week of vigorous-intensity aerobic physical activity, or an equivalent combination of moderate- and vigorous-intensity aerobic activity. Aerobic activity should be performed in episodes of at least 10 minutes, and  preferably, it should be spread throughout the week.  Behavioral modification strategies: increasing lean protein intake.  Alexa Anderson has agreed to follow-up with our clinic in 2-3 weeks. She was informed of the importance of frequent follow-up visits  to maximize her success with intensive lifestyle modifications for her multiple health conditions.   Objective:   Blood pressure (!) 154/82, pulse 72, temperature 98 F (36.7 C), temperature source Oral, height 5\' 8"  (1.727 m), weight 279 lb (126.6 kg), SpO2 98 %. Body mass index is 42.42 kg/m.  General: Cooperative, alert, well developed, in no acute distress. HEENT: Conjunctivae and lids unremarkable. Cardiovascular: Regular rhythm.  Lungs: Normal work of breathing. Neurologic: No focal deficits.   Lab Results  Component Value Date   CREATININE 0.70 03/21/2020   BUN 13 03/21/2020   NA 140 03/21/2020   K 4.1 03/21/2020   CL 103 03/21/2020   CO2 24 03/21/2020   Lab Results  Component Value Date   ALT 17 03/21/2020   AST 17 03/21/2020   ALKPHOS 124 (H) 03/21/2020   BILITOT 0.4 03/21/2020   Lab Results  Component Value Date   HGBA1C 5.4 03/21/2020   HGBA1C 5.3 09/05/2013   Lab Results  Component Value Date   INSULIN 8.9 03/21/2020   Lab Results  Component Value Date   TSH 2.090 03/21/2020   Lab Results  Component Value Date   CHOL 172 02/07/2020   HDL 57.10 02/07/2020   LDLCALC 102 (H) 02/07/2020   TRIG 66.0 02/07/2020   CHOLHDL 3 02/07/2020   Lab Results  Component Value Date   WBC 6.6 03/21/2020   HGB 13.7 03/21/2020   HCT 40.7 03/21/2020   MCV 86 03/21/2020   PLT 260 03/21/2020   Lab Results  Component Value Date   IRON 70 03/21/2020   TIBC 298 03/21/2020   FERRITIN 26 03/21/2020   Attestation Statements:   Reviewed by clinician on day of visit: allergies, medications, problem list, medical history, surgical history, family history, social history, and previous encounter notes.  I, Water quality scientist, CMA, am acting as transcriptionist for Briscoe Deutscher, DO  I have reviewed the above documentation for accuracy and completeness, and I agree with the above. Briscoe Deutscher, DO

## 2020-05-14 ENCOUNTER — Encounter: Payer: Self-pay | Admitting: Obstetrics and Gynecology

## 2020-05-14 ENCOUNTER — Other Ambulatory Visit: Payer: Self-pay

## 2020-05-14 ENCOUNTER — Ambulatory Visit: Payer: BC Managed Care – PPO | Admitting: Obstetrics and Gynecology

## 2020-05-14 VITALS — BP 122/80

## 2020-05-14 DIAGNOSIS — N858 Other specified noninflammatory disorders of uterus: Secondary | ICD-10-CM | POA: Diagnosis not present

## 2020-05-14 DIAGNOSIS — N852 Hypertrophy of uterus: Secondary | ICD-10-CM

## 2020-05-14 NOTE — Progress Notes (Signed)
   Alexa Anderson April 16, 1971 [id number]  SUBJECTIVE:  49 y.o. G2P0011 female presents for follow-up examination of a significantly enlarged fibroid uterus.  We did get a pelvic MRI to further characterize the uterus which appears to contain enlarged fibroids.  She remarkably has had no abdominal pain or pressure symptoms.  Her periods have been fairly normal, and she actually just had a 4-day period that was on the lighter side in terms of flow.  Reports she has lost about 10 pounds intentionally with diet and weight management techniques.   Current Outpatient Medications  Medication Sig Dispense Refill  . amLODipine (NORVASC) 5 MG tablet Take 1 tablet (5 mg total) by mouth daily. 30 tablet 11  . escitalopram (LEXAPRO) 10 MG tablet Take 1 tablet (10 mg total) by mouth in the morning. 30 tablet 0  . propranolol (INDERAL) 20 MG tablet Take 1 tablet (20 mg total) by mouth 3 (three) times daily. 20 tablet 0   No current facility-administered medications for this visit.   Allergies: Bee venom  Patient's last menstrual period was 04/26/2020.  Past medical history,surgical history, problem list, medications, allergies, family history and social history were all reviewed and documented as reviewed in the EPIC chart.   ROS:  Feeling well. No dyspnea or chest pain on exertion.  No abdominal pain, change in bowel habits, black or bloody stools.  No urinary tract symptoms. GYN ROS: Irregular/infrequent menses, no abnormal bleeding, pelvic pain or discharge.    OBJECTIVE:  BP 122/80   LMP 04/26/2020  The patient appears well, alert, oriented x 3, in no distress. PELVIC EXAM: UTERUS: uterus is nontender, enlarged to 18 week's size (stable) ADNEXA: Difficult to palpate due to body habitus and the enlarged uterus, but no apparent tenderness  Chaperone: Caryn Bee present during the examination  ASSESSMENT:  49 y.o. G2P1011 here for pelvic exam surveillance for enlarged fibroid uterus  PLAN:   Patient is reassured that given the relatively asymptomatic enlarged fibroid uterus and stability on exam, I would not recommend hysterectomy at this point.  If she does develop any heavy/prolonged menstrual bleeding patterns, or dysmenorrhea, or any abdominal pain/pressure, appetite change, bowel movement changes, she should come back for evaluation. RTC 6-12 months for repeat pelvic exam.   Joseph Pierini MD 05/14/20

## 2020-05-15 ENCOUNTER — Encounter: Payer: Self-pay | Admitting: Obstetrics and Gynecology

## 2020-06-06 ENCOUNTER — Other Ambulatory Visit: Payer: Self-pay

## 2020-06-06 ENCOUNTER — Ambulatory Visit (INDEPENDENT_AMBULATORY_CARE_PROVIDER_SITE_OTHER): Payer: BC Managed Care – PPO | Admitting: Family Medicine

## 2020-06-06 ENCOUNTER — Encounter (INDEPENDENT_AMBULATORY_CARE_PROVIDER_SITE_OTHER): Payer: Self-pay | Admitting: Family Medicine

## 2020-06-06 VITALS — BP 132/84 | HR 66 | Temp 98.4°F | Ht 68.0 in | Wt 279.0 lb

## 2020-06-06 DIAGNOSIS — Z9189 Other specified personal risk factors, not elsewhere classified: Secondary | ICD-10-CM | POA: Diagnosis not present

## 2020-06-06 DIAGNOSIS — F419 Anxiety disorder, unspecified: Secondary | ICD-10-CM | POA: Diagnosis not present

## 2020-06-06 DIAGNOSIS — I10 Essential (primary) hypertension: Secondary | ICD-10-CM | POA: Diagnosis not present

## 2020-06-06 DIAGNOSIS — Z6841 Body Mass Index (BMI) 40.0 and over, adult: Secondary | ICD-10-CM | POA: Diagnosis not present

## 2020-06-06 NOTE — Progress Notes (Addendum)
Chief Complaint:   OBESITY Alexa Anderson is here to discuss her progress with her obesity treatment plan along with follow-up of her obesity related diagnoses. Alexa Anderson is on keeping a food journal and adhering to recommended goals of 1800 calories and 125 grams of protein and states she is following her eating plan approximately 80% of the time. Alexa Anderson states she is walking for 30 minutes 3 times per week.  Today's visit was #: 5 Starting weight: 290 lbs Starting date: 03/21/2020 Today's weight: 279 lbs Today's date: 06/06/2020 Total lbs lost to date: 11 lbs Total weight loss percentage to date: -3.79%  Interim History: Alexa Anderson says she has had a difficult month emotionally.  She has been working from home.  She is looking for an equivalent job, she does not have a degree, though more than a decade of experience.  She has had poor dating experiences. She continues to find support from her son. She continues to work on [employer].  Assessment/Plan:   1. Essential hypertension Not optimized. Plan: Monitor home BP. Diet: Avoid buying foods that are: processed, frozen, or prepackaged to avoid excess salt. Will continue to monitor symptoms as they relate to her weight loss journey.  BP Readings from Last 3 Encounters:  06/06/20 132/84  05/14/20 122/80  05/08/20 (!) 154/82   2. Anxiety Not at goal. Behavior modification techniques were discussed today to help Alexa Anderson deal with her anxiety.  She is taking Lexapro and propranolol as needed, both medications have been helpful. The current medical regimen is effective;  continue present plan and medications.  3. At risk for heart disease Alexa Anderson was given approximately 15 minutes of coronary artery disease prevention counseling today. She is 49 y.o. female and has risk factors for heart disease including obesity and HTN. We discussed intensive lifestyle modifications today with an emphasis on specific weight loss instructions and strategies.    Repetitive spaced learning was employed today to elicit superior memory formation and behavioral change.  4. Class 3 severe obesity with serious comorbidity and body mass index (BMI) of 40.0 to 44.9 in adult, unspecified obesity type (HCC) Alexa Anderson is currently in the action stage of change. As such, her goal is to continue with weight loss efforts. She has agreed to keeping a food journal and adhering to recommended goals of 1800 calories and 125 grams of protein.   Exercise goals: For substantial health benefits, adults should do at least 150 minutes (2 hours and 30 minutes) a week of moderate-intensity, or 75 minutes (1 hour and 15 minutes) a week of vigorous-intensity aerobic physical activity, or an equivalent combination of moderate- and vigorous-intensity aerobic activity. Aerobic activity should be performed in episodes of at least 10 minutes, and preferably, it should be spread throughout the week.  Behavioral modification strategies: increasing lean protein intake and increasing water intake.  Alexa Anderson has agreed to follow-up with our clinic in 2-3 weeks. She was informed of the importance of frequent follow-up visits to maximize her success with intensive lifestyle modifications for her multiple health conditions.   Objective:   Blood pressure 132/84, pulse 66, temperature 98.4 F (36.9 C), temperature source Oral, height 5\' 8"  (1.727 m), weight 279 lb (126.6 kg), SpO2 97 %. Body mass index is 42.42 kg/m.  General: Cooperative, alert, well developed, in no acute distress. HEENT: Conjunctivae and lids unremarkable. Cardiovascular: Regular rhythm.  Lungs: Normal work of breathing. Neurologic: No focal deficits.   Lab Results  Component Value Date   CREATININE 0.70  03/21/2020   BUN 13 03/21/2020   NA 140 03/21/2020   K 4.1 03/21/2020   CL 103 03/21/2020   CO2 24 03/21/2020   Lab Results  Component Value Date   ALT 17 03/21/2020   AST 17 03/21/2020   ALKPHOS 124 (H)  03/21/2020   BILITOT 0.4 03/21/2020   Lab Results  Component Value Date   HGBA1C 5.4 03/21/2020   HGBA1C 5.3 09/05/2013   Lab Results  Component Value Date   INSULIN 8.9 03/21/2020   Lab Results  Component Value Date   TSH 2.090 03/21/2020   Lab Results  Component Value Date   CHOL 172 02/07/2020   HDL 57.10 02/07/2020   LDLCALC 102 (H) 02/07/2020   TRIG 66.0 02/07/2020   CHOLHDL 3 02/07/2020   Lab Results  Component Value Date   WBC 6.6 03/21/2020   HGB 13.7 03/21/2020   HCT 40.7 03/21/2020   MCV 86 03/21/2020   PLT 260 03/21/2020   Lab Results  Component Value Date   IRON 70 03/21/2020   TIBC 298 03/21/2020   FERRITIN 26 03/21/2020   Attestation Statements:   Reviewed by clinician on day of visit: allergies, medications, problem list, medical history, surgical history, family history, social history, and previous encounter notes.  I, Water quality scientist, CMA, am acting as transcriptionist for Briscoe Deutscher, DO  I have reviewed the above documentation for accuracy and completeness, and I agree with the above. Briscoe Deutscher, DO

## 2020-06-13 ENCOUNTER — Encounter (INDEPENDENT_AMBULATORY_CARE_PROVIDER_SITE_OTHER): Payer: Self-pay

## 2020-06-13 ENCOUNTER — Other Ambulatory Visit (INDEPENDENT_AMBULATORY_CARE_PROVIDER_SITE_OTHER): Payer: Self-pay | Admitting: Family Medicine

## 2020-06-13 DIAGNOSIS — F3289 Other specified depressive episodes: Secondary | ICD-10-CM

## 2020-06-13 NOTE — Telephone Encounter (Signed)
My chart message sent to pt.

## 2020-06-14 NOTE — Addendum Note (Signed)
Addended by: Briscoe Deutscher R on: 06/14/2020 09:59 AM   Modules accepted: Level of Service

## 2020-06-20 ENCOUNTER — Encounter (INDEPENDENT_AMBULATORY_CARE_PROVIDER_SITE_OTHER): Payer: Self-pay | Admitting: Family Medicine

## 2020-06-20 ENCOUNTER — Other Ambulatory Visit: Payer: Self-pay

## 2020-06-20 ENCOUNTER — Ambulatory Visit (INDEPENDENT_AMBULATORY_CARE_PROVIDER_SITE_OTHER): Payer: BC Managed Care – PPO | Admitting: Family Medicine

## 2020-06-20 VITALS — BP 120/79 | HR 66 | Temp 98.0°F | Ht 68.0 in | Wt 279.0 lb

## 2020-06-20 DIAGNOSIS — I10 Essential (primary) hypertension: Secondary | ICD-10-CM

## 2020-06-20 DIAGNOSIS — Z6841 Body Mass Index (BMI) 40.0 and over, adult: Secondary | ICD-10-CM | POA: Diagnosis not present

## 2020-06-20 DIAGNOSIS — F419 Anxiety disorder, unspecified: Secondary | ICD-10-CM

## 2020-06-20 MED ORDER — ESCITALOPRAM OXALATE 10 MG PO TABS: 10.0000 mg | ORAL_TABLET | Freq: Every morning | ORAL | 0 refills | Status: DC

## 2020-06-20 MED ORDER — PROPRANOLOL HCL 20 MG PO TABS: 20.0000 mg | ORAL_TABLET | Freq: Three times a day (TID) | ORAL | 0 refills | Status: DC

## 2020-06-24 NOTE — Progress Notes (Signed)
Chief Complaint:   OBESITY Rajean is here to discuss her progress with her obesity treatment plan along with follow-up of her obesity related diagnoses. Tresha is on keeping a food journal and adhering to recommended goals of 1800 calories and 125 grams of protein and states she is following her eating plan approximately 80% of the time. Marcelene states she is walking for 30+ minutes 3-4 times per week.  Today's visit was #: 6 Starting weight: 290 lbs Starting date: 03/21/2020 Today's weight: 279 lbs Today's date: 06/20/2020 Total lbs lost to date: 11 lbs Total weight loss percentage to date: -3.79%  Interim History: Today's bioimpedance results indicate that Correne has gained 5 pounds of water weight since her last visit.  She has also gained 1 pound of muscle.  She says she has been focused on success.  She has tried new recipes.  She is enjoying [employer] and [employer].  She had lunch with a friend and went to the beach.  She stayed home in bed all day once for self-care.  She has increased her water intake.  Assessment/Plan:   1. Essential hypertension Medication: amlodipine.  Blood pressure is at goal.  Will continue to monitor.  BP Readings from Last 3 Encounters:  06/20/20 120/79  06/06/20 132/84  05/14/20 122/80   2. Anxiety, with emotional eating Improving. Behavior modification techniques were discussed today to help Ranee deal with her anxiety. She is taking Lexapro and propranolol as needed.  - Refill escitalopram (LEXAPRO) 10 MG tablet; Take 1 tablet (10 mg total) by mouth in the morning.  Dispense: 30 tablet; Refill: 0 - Refill propranolol (INDERAL) 20 MG tablet; Take 1 tablet (20 mg total) by mouth 3 (three) times daily.  Dispense: 20 tablet; Refill: 0  3. Class 3 severe obesity with serious comorbidity and body mass index (BMI) of 40.0 to 44.9 in adult, unspecified obesity type (HCC) Jenee is currently in the action stage of change. As such, her goal is to  continue with weight loss efforts. She has agreed to keeping a food journal and adhering to recommended goals of 1800 calories and 125 grams of protein.   Exercise goals: For substantial health benefits, adults should do at least 150 minutes (2 hours and 30 minutes) a week of moderate-intensity, or 75 minutes (1 hour and 15 minutes) a week of vigorous-intensity aerobic physical activity, or an equivalent combination of moderate- and vigorous-intensity aerobic activity. Aerobic activity should be performed in episodes of at least 10 minutes, and preferably, it should be spread throughout the week.  Behavioral modification strategies: increasing lean protein intake, decreasing simple carbohydrates, increasing vegetables and increasing water intake.  Movement daily.  Keelie has agreed to follow-up with our clinic in 2-3 weeks. She was informed of the importance of frequent follow-up visits to maximize her success with intensive lifestyle modifications for her multiple health conditions.   Objective:   Blood pressure 120/79, pulse 66, temperature 98 F (36.7 C), temperature source Oral, height 5\' 8"  (1.727 m), weight 279 lb (126.6 kg), SpO2 98 %. Body mass index is 42.42 kg/m.  General: Cooperative, alert, well developed, in no acute distress. HEENT: Conjunctivae and lids unremarkable. Cardiovascular: Regular rhythm.  Lungs: Normal work of breathing. Neurologic: No focal deficits.   Lab Results  Component Value Date   CREATININE 0.70 03/21/2020   BUN 13 03/21/2020   NA 140 03/21/2020   K 4.1 03/21/2020   CL 103 03/21/2020   CO2 24 03/21/2020  Lab Results  Component Value Date   ALT 17 03/21/2020   AST 17 03/21/2020   ALKPHOS 124 (H) 03/21/2020   BILITOT 0.4 03/21/2020   Lab Results  Component Value Date   HGBA1C 5.4 03/21/2020   HGBA1C 5.3 09/05/2013   Lab Results  Component Value Date   INSULIN 8.9 03/21/2020   Lab Results  Component Value Date   TSH 2.090 03/21/2020     Lab Results  Component Value Date   CHOL 172 02/07/2020   HDL 57.10 02/07/2020   LDLCALC 102 (H) 02/07/2020   TRIG 66.0 02/07/2020   CHOLHDL 3 02/07/2020   Lab Results  Component Value Date   WBC 6.6 03/21/2020   HGB 13.7 03/21/2020   HCT 40.7 03/21/2020   MCV 86 03/21/2020   PLT 260 03/21/2020   Lab Results  Component Value Date   IRON 70 03/21/2020   TIBC 298 03/21/2020   FERRITIN 26 03/21/2020   Attestation Statements:   Reviewed by clinician on day of visit: allergies, medications, problem list, medical history, surgical history, family history, social history, and previous encounter notes.  I, Water quality scientist, CMA, am acting as transcriptionist for Briscoe Deutscher, DO  I have reviewed the above documentation for accuracy and completeness, and I agree with the above. Briscoe Deutscher, DO

## 2020-07-04 ENCOUNTER — Ambulatory Visit (INDEPENDENT_AMBULATORY_CARE_PROVIDER_SITE_OTHER): Payer: BC Managed Care – PPO | Admitting: Family Medicine

## 2020-07-04 ENCOUNTER — Other Ambulatory Visit: Payer: Self-pay

## 2020-07-04 ENCOUNTER — Encounter (INDEPENDENT_AMBULATORY_CARE_PROVIDER_SITE_OTHER): Payer: Self-pay | Admitting: Family Medicine

## 2020-07-04 VITALS — BP 135/88 | HR 66 | Temp 98.0°F | Ht 68.0 in | Wt 275.0 lb

## 2020-07-04 DIAGNOSIS — F419 Anxiety disorder, unspecified: Secondary | ICD-10-CM | POA: Diagnosis not present

## 2020-07-04 DIAGNOSIS — Z6841 Body Mass Index (BMI) 40.0 and over, adult: Secondary | ICD-10-CM

## 2020-07-04 DIAGNOSIS — R790 Abnormal level of blood mineral: Secondary | ICD-10-CM

## 2020-07-04 MED ORDER — PROPRANOLOL HCL 20 MG PO TABS: 20.0000 mg | ORAL_TABLET | Freq: Three times a day (TID) | ORAL | 2 refills | Status: DC

## 2020-07-04 MED ORDER — ESCITALOPRAM OXALATE 10 MG PO TABS: 10.0000 mg | ORAL_TABLET | Freq: Every morning | ORAL | 0 refills | Status: DC

## 2020-07-04 NOTE — Progress Notes (Addendum)
Chief Complaint:   OBESITY Alexa Anderson is here to discuss her progress with her obesity treatment plan along with follow-up of her obesity related diagnoses. Alexa Anderson is on practicing portion control and making smarter food choices, such as increasing vegetables and decreasing simple carbohydrates and states she is following her eating plan approximately 100% of the time.   Today's visit was #: 7 Starting weight: 290 lbs Starting date: 03/21/2020 Today's weight: 275 lbs Today's date: 07/04/2020 Total lbs lost to date: 15 lbs Total lbs lost since last in-office visit: 4 lbs Total weight loss percentage to date: -5.17%  Interim History: Alexa Anderson is very happy about her current progress.  She continues to make very smart food choices.  She is also worked very hard on moving more, even ordering a stationary bike for home so that she can watch TV and exercise at same time.  She has found some new go to restaurants for healthy food choices.  She is also been networking and may have a lead on a new job position.  She has been working on self-care and self left.  She even showed me a full body selfie that she posted on Instagram with the hashtag #[email or web address].  Assessment/Plan:   1. Anxiety, with emotional eating Alexa Anderson continues to do well with exercise and eating healthy foods for self-care.  She finds the current medications Lexapro propranolol very helpful.  She is currently taking propranolol once daily.  No side effects.  She would like to continue with these medications.  We will refill today.  Meds ordered this encounter  Medications  . escitalopram (LEXAPRO) 10 MG tablet    Sig: Take 1 tablet (10 mg total) by mouth in the morning.    Dispense:  90 tablet    Refill:  0  . propranolol (INDERAL) 20 MG tablet    Sig: Take 1 tablet (20 mg total) by mouth 3 (three) times daily.    Dispense:  90 tablet    Refill:  2   2. Low ferritin Will continue to monitor symptoms as they  relate to her weight loss journey.  3. Class 3 severe obesity with serious comorbidity and body mass index (BMI) of 40.0 to 44.9 in adult, unspecified obesity type (HCC)  Alexa Anderson is currently in the action stage of change. As such, her goal is to continue with weight loss efforts. She has agreed to practicing portion control and making smarter food choices, such as increasing vegetables and decreasing simple carbohydrates.   Exercise goals: For substantial health benefits, adults should do at least 150 minutes (2 hours and 30 minutes) a week of moderate-intensity, or 75 minutes (1 hour and 15 minutes) a week of vigorous-intensity aerobic physical activity, or an equivalent combination of moderate- and vigorous-intensity aerobic activity. Aerobic activity should be performed in episodes of at least 10 minutes, and preferably, it should be spread throughout the week. Adults should also include muscle-strengthening activities that involve all major muscle groups on 2 or more days a week.  Behavioral modification strategies: emotional eating strategies.  Alexa Anderson has agreed to follow-up with our clinic in 2 weeks. She was informed of the importance of frequent follow-up visits to maximize her success with intensive lifestyle modifications for her multiple health conditions.   Objective:   Blood pressure 135/88, pulse 66, temperature 98 F (36.7 C), temperature source Oral, height 5\' 8"  (1.727 m), weight 275 lb (124.7 kg), SpO2 97 %. Body mass index is 41.81 kg/m.  General: Cooperative,  alert, well developed, in no acute distress. HEENT: Conjunctivae and lids unremarkable. Cardiovascular: Regular rhythm.  Lungs: Normal work of breathing. Neurologic: No focal deficits.   Lab Results  Component Value Date   CREATININE 0.70 03/21/2020   BUN 13 03/21/2020   NA 140 03/21/2020   K 4.1 03/21/2020   CL 103 03/21/2020   CO2 24 03/21/2020   Lab Results  Component Value Date   ALT 17 03/21/2020    AST 17 03/21/2020   ALKPHOS 124 (H) 03/21/2020   BILITOT 0.4 03/21/2020   Lab Results  Component Value Date   HGBA1C 5.4 03/21/2020   HGBA1C 5.3 09/05/2013   Lab Results  Component Value Date   INSULIN 8.9 03/21/2020   Lab Results  Component Value Date   TSH 2.090 03/21/2020   Lab Results  Component Value Date   CHOL 172 02/07/2020   HDL 57.10 02/07/2020   LDLCALC 102 (H) 02/07/2020   TRIG 66.0 02/07/2020   CHOLHDL 3 02/07/2020   Lab Results  Component Value Date   WBC 6.6 03/21/2020   HGB 13.7 03/21/2020   HCT 40.7 03/21/2020   MCV 86 03/21/2020   PLT 260 03/21/2020   Lab Results  Component Value Date   IRON 70 03/21/2020   TIBC 298 03/21/2020   FERRITIN 26 03/21/2020   Attestation Statements:   Reviewed by clinician on day of visit: allergies, medications, problem list, medical history, surgical history, family history, social history, and previous encounter notes.

## 2020-07-18 ENCOUNTER — Other Ambulatory Visit: Payer: Self-pay

## 2020-07-18 ENCOUNTER — Ambulatory Visit (INDEPENDENT_AMBULATORY_CARE_PROVIDER_SITE_OTHER): Payer: BC Managed Care – PPO | Admitting: Family Medicine

## 2020-07-18 ENCOUNTER — Encounter (INDEPENDENT_AMBULATORY_CARE_PROVIDER_SITE_OTHER): Payer: Self-pay | Admitting: Family Medicine

## 2020-07-18 VITALS — BP 157/75 | HR 73 | Temp 98.2°F | Ht 68.0 in | Wt 281.0 lb

## 2020-07-18 DIAGNOSIS — I10 Essential (primary) hypertension: Secondary | ICD-10-CM | POA: Diagnosis not present

## 2020-07-18 DIAGNOSIS — F418 Other specified anxiety disorders: Secondary | ICD-10-CM

## 2020-07-18 DIAGNOSIS — G4709 Other insomnia: Secondary | ICD-10-CM

## 2020-07-18 DIAGNOSIS — Z6841 Body Mass Index (BMI) 40.0 and over, adult: Secondary | ICD-10-CM

## 2020-07-18 DIAGNOSIS — Z9189 Other specified personal risk factors, not elsewhere classified: Secondary | ICD-10-CM | POA: Diagnosis not present

## 2020-07-18 MED ORDER — ESCITALOPRAM OXALATE 10 MG PO TABS: 10.0000 mg | ORAL_TABLET | Freq: Every morning | ORAL | 0 refills | Status: DC

## 2020-07-18 MED ORDER — PROPRANOLOL HCL 20 MG PO TABS: 20.0000 mg | ORAL_TABLET | Freq: Three times a day (TID) | ORAL | 2 refills | Status: DC

## 2020-07-18 MED ORDER — TRAZODONE HCL 50 MG PO TABS: 50.0000 mg | ORAL_TABLET | Freq: Every evening | ORAL | 0 refills | Status: DC | PRN

## 2020-07-22 NOTE — Progress Notes (Signed)
Chief Complaint:   OBESITY Alexa Anderson is here to discuss her progress with her obesity treatment plan along with follow-up of her obesity related diagnoses. Edelmira is on practicing portion control and making smarter food choices, such as increasing vegetables and decreasing simple carbohydrates and states she is following her eating plan a small percentage of the time. Alexa Anderson states she is walking and/or biking 1-3 times per week.  Today's visit was #: 8 Starting weight: 290 lbs Starting date: 03/21/2020 Today's weight: 281 lbs Today's date: 07/18/2020 Total lbs lost to date: 9 lbs Total lbs lost since last in-office visit: +6 lbs Total weight loss percentage to date: -5.17%  Interim History: Alexa Anderson says she has had a tough week, but she has still been keeping a mental note of struggles AND wins.  She says she did not succumb to emotional eating even with extreme stress at work. Today's bioimpedance results indicate that Alexa Anderson has gained 4 pounds of water weight since her last visit.  Assessment/Plan:   1. Other insomnia This is poorly controlled. Dysfunction: early morning awakening and frequent night time awakening. Average hours of sleep per night: < 6 lately.   Plan: Recommend sleep hygiene measures including regular sleep schedule, optimal sleep environment, and relaxing presleep rituals. Medication: After discussion, patient would like to start below medication. Expectations, risks, and potential side effects reviewed.   - Start traZODone (DESYREL) 50 MG tablet; Take 1 tablet (50 mg total) by mouth at bedtime as needed for sleep.  Dispense: 30 tablet; Refill: 0  2. Essential hypertension Elevated today. Medications: Norvasc 5 mg daily. Plan: Monitor home BP. Diet: Avoid buying foods that are: processed, frozen, or prepackaged to avoid excess salt. Recheck BP at next visit. The patient understands monitoring parameters and red flags.   BP Readings from Last 3 Encounters:    07/18/20 (!) 157/75  07/04/20 135/88  06/20/20 120/79   Lab Results  Component Value Date   CREATININE 0.70 03/21/2020   3. Situational anxiety Ellinore is taking Lexapro 10 mg daily and propranolol 20 mg three times daily as needed for anxiety. These have been helpful during the last few months of increased stress. We will continue to monitor symptoms as they relate to her weight loss journey.This issue directly impacts care plan for optimization of BMI and metabolic health as it impacts the patient's ability to make lifestyle changes.  - Refill escitalopram (LEXAPRO) 10 MG tablet; Take 1 tablet (10 mg total) by mouth in the morning.  Dispense: 90 tablet; Refill: 0 - Refill propranolol (INDERAL) 20 MG tablet; Take 1 tablet (20 mg total) by mouth 3 (three) times daily.  Dispense: 90 tablet; Refill: 2  4. At risk for deficient intake of food Approximately 10 minutes discussion. The patient is at a higher than average risk of deficient intake of food due to severe stress. She will continue to focus on protein-rich, low simple carbohydrate foods. We reviewed the importance of hydration, regular exercise for stress reduction, and restorative sleep.   5. Class 3 severe obesity with serious comorbidity and body mass index (BMI) of 40.0 to 44.9 in adult, unspecified obesity type (HCC)  Alexa Anderson is currently in the action stage of change. As such, her goal is to continue with weight loss efforts. She has agreed to keeping a food journal and adhering to recommended goals of 1800 calories and >100 grams of protein.   Exercise goals: For substantial health benefits, adults should do at least 150 minutes (2  hours and 30 minutes) a week of moderate-intensity, or 75 minutes (1 hour and 15 minutes) a week of vigorous-intensity aerobic physical activity, or an equivalent combination of moderate- and vigorous-intensity aerobic activity. Aerobic activity should be performed in episodes of at least 10 minutes, and  preferably, it should be spread throughout the week.  Behavioral modification strategies: increasing lean protein intake, decreasing simple carbohydrates, increasing vegetables, increasing water intake and emotional eating strategies.  Alexa Anderson has agreed to follow-up with our clinic in 2-3 weeks. She was informed of the importance of frequent follow-up visits to maximize her success with intensive lifestyle modifications for her multiple health conditions.   Objective:   Blood pressure (!) 157/75, pulse 73, temperature 98.2 F (36.8 C), temperature source Oral, height 5\' 8"  (1.727 m), weight 281 lb (127.5 kg), SpO2 96 %. Body mass index is 42.73 kg/m.  General: Cooperative, alert, well developed, in no acute distress. HEENT: Conjunctivae and lids unremarkable. Cardiovascular: Regular rhythm.  Lungs: Normal work of breathing. Neurologic: No focal deficits.   Lab Results  Component Value Date   CREATININE 0.70 03/21/2020   BUN 13 03/21/2020   NA 140 03/21/2020   K 4.1 03/21/2020   CL 103 03/21/2020   CO2 24 03/21/2020   Lab Results  Component Value Date   ALT 17 03/21/2020   AST 17 03/21/2020   ALKPHOS 124 (H) 03/21/2020   BILITOT 0.4 03/21/2020   Lab Results  Component Value Date   HGBA1C 5.4 03/21/2020   HGBA1C 5.3 09/05/2013   Lab Results  Component Value Date   INSULIN 8.9 03/21/2020   Lab Results  Component Value Date   TSH 2.090 03/21/2020   Lab Results  Component Value Date   CHOL 172 02/07/2020   HDL 57.10 02/07/2020   LDLCALC 102 (H) 02/07/2020   TRIG 66.0 02/07/2020   CHOLHDL 3 02/07/2020   Lab Results  Component Value Date   WBC 6.6 03/21/2020   HGB 13.7 03/21/2020   HCT 40.7 03/21/2020   MCV 86 03/21/2020   PLT 260 03/21/2020   Lab Results  Component Value Date   IRON 70 03/21/2020   TIBC 298 03/21/2020   FERRITIN 26 03/21/2020   Attestation Statements:   Reviewed by clinician on day of visit: allergies, medications, problem list,  medical history, surgical history, family history, social history, and previous encounter notes.  I, Water quality scientist, CMA, am acting as transcriptionist for Briscoe Deutscher, DO  I have reviewed the above documentation for accuracy and completeness, and I agree with the above. Briscoe Deutscher, DO

## 2020-08-01 ENCOUNTER — Other Ambulatory Visit: Payer: Self-pay

## 2020-08-01 ENCOUNTER — Ambulatory Visit (INDEPENDENT_AMBULATORY_CARE_PROVIDER_SITE_OTHER): Payer: BC Managed Care – PPO | Admitting: Family Medicine

## 2020-08-01 ENCOUNTER — Encounter (INDEPENDENT_AMBULATORY_CARE_PROVIDER_SITE_OTHER): Payer: Self-pay | Admitting: Family Medicine

## 2020-08-01 VITALS — BP 126/84 | HR 66 | Temp 97.9°F | Ht 68.0 in | Wt 278.0 lb

## 2020-08-01 DIAGNOSIS — Z6841 Body Mass Index (BMI) 40.0 and over, adult: Secondary | ICD-10-CM | POA: Diagnosis not present

## 2020-08-01 DIAGNOSIS — I10 Essential (primary) hypertension: Secondary | ICD-10-CM

## 2020-08-01 DIAGNOSIS — F3289 Other specified depressive episodes: Secondary | ICD-10-CM

## 2020-08-03 NOTE — Progress Notes (Signed)
Chief Complaint:   OBESITY Alexa Anderson is here to discuss her progress with her obesity treatment plan along with follow-up of her obesity related diagnoses.   Interim History: Doing well overall. 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 Instead, she maintained her healthy meal goals. She is very proud of herself.  Nutrition Plan: practicing portion control and making smarter food choices, such as increasing vegetables and decreasing simple carbohydrates.  Hunger is moderately controlled controlled. Cravings are moderately controlled controlled.  Sleep: Sleep is restful. Trazodone was prescribed at the last visit. She finds this very helpful, using 3-4 times per week.  Assessment/Plan:   1. Essential hypertension At goal. Medication: Amlodipine. Plan: Diet: Avoid buying foods that are: processed, frozen, or prepackaged to avoid excess salt. She will continue to focus on protein-rich, low simple carbohydrate foods. We reviewed the importance of hydration, regular exercise for stress reduction, and restorative sleep.   BP Readings from Last 3 Encounters:  08/01/20 126/84  07/18/20 (!) 157/75  07/04/20 135/88   2. Other depression, with emotional eating Improving. We will continue to work on emotional eating strategies.   3. Class 3 severe obesity with serious comorbidity and body mass index (BMI) of 40.0 to 44.9 in adult, unspecified obesity type (HCC)  Alexa Anderson is currently in the action stage of change. As such, her goal is to continue with weight loss efforts.   Nutrition goals: She has agreed to practicing portion control and making smarter food choices, such as increasing vegetables and decreasing simple carbohydrates.   Exercise goals: For substantial health benefits, adults should do at least 150 minutes (2 hours and 30 minutes) a week of moderate-intensity, or 75 minutes (1 hour and 15 minutes) a week of vigorous-intensity aerobic physical  activity, or an equivalent combination of moderate- and vigorous-intensity aerobic activity. Aerobic activity should be performed in episodes of at least 10 minutes, and preferably, it should be spread throughout the week. Adults should also include muscle-strengthening activities that involve all major muscle groups on 2 or more days a week.  Behavioral modification strategies: increasing lean protein intake, decreasing simple carbohydrates, increasing water intake, emotional eating strategies and avoiding temptations.  Alexa Anderson has agreed to follow-up with our clinic in 2 weeks. She was informed of the importance of frequent follow-up visits to maximize her success with intensive lifestyle modifications for her multiple health conditions.   Objective:   Blood pressure 126/84, pulse 66, temperature 97.9 F (36.6 C), temperature source Oral, height 5\' 8"  (1.727 m), weight 278 lb (126.1 kg), SpO2 97 %. Body mass index is 42.27 kg/m.  General: Cooperative, alert, well developed, in no acute distress. HEENT: Conjunctivae and lids unremarkable. Cardiovascular: Regular rhythm.  Lungs: Normal work of breathing. Neurologic: No focal deficits.   Lab Results  Component Value Date   CREATININE 0.70 03/21/2020   BUN 13 03/21/2020   NA 140 03/21/2020   K 4.1 03/21/2020   CL 103 03/21/2020   CO2 24 03/21/2020   Lab Results  Component Value Date   ALT 17 03/21/2020   AST 17 03/21/2020   ALKPHOS 124 (H) 03/21/2020   BILITOT 0.4 03/21/2020   Lab Results  Component Value Date   HGBA1C 5.4 03/21/2020   HGBA1C 5.3 09/05/2013   Lab Results  Component Value Date   INSULIN 8.9 03/21/2020   Lab Results  Component Value Date   TSH 2.090 03/21/2020   Lab Results  Component Value Date  CHOL 172 02/07/2020   HDL 57.10 02/07/2020   LDLCALC 102 (H) 02/07/2020   TRIG 66.0 02/07/2020   CHOLHDL 3 02/07/2020   Lab Results  Component Value Date   WBC 6.6 03/21/2020   HGB 13.7 03/21/2020    HCT 40.7 03/21/2020   MCV 86 03/21/2020   PLT 260 03/21/2020   Lab Results  Component Value Date   IRON 70 03/21/2020   TIBC 298 03/21/2020   FERRITIN 26 03/21/2020   Attestation Statements:   Reviewed by clinician on day of visit: allergies, medications, problem list, medical history, surgical history, family history, social history, and previous encounter notes.  Time spent on visit including pre-visit chart review and post-visit care and charting was 30 minutes.

## 2020-08-12 ENCOUNTER — Encounter (INDEPENDENT_AMBULATORY_CARE_PROVIDER_SITE_OTHER): Payer: Self-pay | Admitting: Family Medicine

## 2020-08-12 ENCOUNTER — Ambulatory Visit (INDEPENDENT_AMBULATORY_CARE_PROVIDER_SITE_OTHER): Payer: BC Managed Care – PPO | Admitting: Family Medicine

## 2020-08-12 ENCOUNTER — Other Ambulatory Visit: Payer: Self-pay

## 2020-08-12 VITALS — BP 118/79 | HR 61 | Temp 98.3°F | Ht 68.0 in | Wt 275.0 lb

## 2020-08-12 DIAGNOSIS — G4709 Other insomnia: Secondary | ICD-10-CM

## 2020-08-12 DIAGNOSIS — Z6841 Body Mass Index (BMI) 40.0 and over, adult: Secondary | ICD-10-CM

## 2020-08-12 DIAGNOSIS — F418 Other specified anxiety disorders: Secondary | ICD-10-CM | POA: Diagnosis not present

## 2020-08-12 DIAGNOSIS — F3289 Other specified depressive episodes: Secondary | ICD-10-CM | POA: Diagnosis not present

## 2020-08-12 DIAGNOSIS — I10 Essential (primary) hypertension: Secondary | ICD-10-CM | POA: Diagnosis not present

## 2020-08-12 NOTE — Progress Notes (Signed)
Chief Complaint:   OBESITY Alexa Anderson is here to discuss her progress with her obesity treatment plan along with follow-up of her obesity related diagnoses.   Today's visit was #: 10 Starting weight: 290 lbs Starting date: 03/21/2020 Today's weight: 275 lbs Today's date: 08/12/2020 Total lbs lost to date: 15 lbs Body mass index is 41.81 kg/m.  Total weight loss percentage to date: -5.17%  Interim History: Alexa Anderson says she has not done any emotional eating.  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     Nutrition Plan: keeping a food journal and adhering to recommended goals of 1800 calories and >100 grams of protein for 90% of the time.  Hunger is moderately controlled controlled. Cravings are moderately controlled controlled.  Activity: Walking/cardio for 30 minutes 4 times per week.  Assessment/Plan:   1. Essential hypertension At goal. Medications: Amlodipine 5 mg po daily. Plan: Avoid buying foods that are: processed, frozen, or prepackaged to avoid excess salt. We will continue to monitor symptoms as they relate to her weight loss journey.  BP Readings from Last 3 Encounters:  08/12/20 118/79  08/01/20 126/84  07/18/20 (!) 157/75   Lab Results  Component Value Date   CREATININE 0.70 03/21/2020   2. Other insomnia This is well controlled. Dysfunction: difficulty falling asleep and staying asleep.  Plan: Recommend sleep hygiene measures including regular sleep schedule, optimal sleep environment, and relaxing presleep rituals. Medication: trazodone 50 mg at bedtime as needed.  3. Situational anxiety Improving.  Behavior modification techniques were discussed today to help Alexa Anderson deal with her anxiety.    4. Other depression, with emotional eating She is doing very well.  Taking Lexapro and propranolol. We will continue to monitor symptoms as they relate to her weight loss journey.  5. Class 3 severe obesity  with serious comorbidity and body mass index (BMI) of 40.0 to 44.9 in adult, unspecified obesity type (Alexa Anderson)  Course: Alexa Anderson is currently in the action stage of change. As such, her goal is to continue with weight loss efforts.   Nutrition goals: She has agreed to keeping a food journal and adhering to recommended goals of 1800 calories and >100 grams of protein.   Exercise goals: For substantial health benefits, adults should do at least 150 minutes (2 hours and 30 minutes) a week of moderate-intensity, or 75 minutes (1 hour and 15 minutes) a week of vigorous-intensity aerobic physical activity, or an equivalent combination of moderate- and vigorous-intensity aerobic activity. Aerobic activity should be performed in episodes of at least 10 minutes, and preferably, it should be spread throughout the week.  Behavioral modification strategies: increasing lean protein intake, emotional eating strategies and dealing with family or coworker sabotage.  Alexa Anderson has agreed to follow-up with our clinic in 3 weeks. She was informed of the importance of frequent follow-up visits to maximize her success with intensive lifestyle modifications for her multiple health conditions.   Objective:   Blood pressure 118/79, pulse 61, temperature 98.3 F (36.8 C), temperature source Oral, height 5\' 8"  (1.727 m), weight 275 lb (124.7 kg), SpO2 97 %. Body mass index is 41.81 kg/m.  General: Cooperative, alert, well developed, in no acute distress. HEENT: Conjunctivae and lids unremarkable. Cardiovascular: Regular rhythm.  Lungs: Normal work of breathing. Neurologic: No focal deficits.   Lab Results  Component Value Date   CREATININE 0.70 03/21/2020   BUN 13 03/21/2020   NA  140 03/21/2020   K 4.1 03/21/2020   CL 103 03/21/2020   CO2 24 03/21/2020   Lab Results  Component Value Date   ALT 17 03/21/2020   AST 17 03/21/2020   ALKPHOS 124 (H) 03/21/2020   BILITOT 0.4 03/21/2020   Lab Results  Component  Value Date   HGBA1C 5.4 03/21/2020   HGBA1C 5.3 09/05/2013   Lab Results  Component Value Date   INSULIN 8.9 03/21/2020   Lab Results  Component Value Date   TSH 2.090 03/21/2020   Lab Results  Component Value Date   CHOL 172 02/07/2020   HDL 57.10 02/07/2020   LDLCALC 102 (H) 02/07/2020   TRIG 66.0 02/07/2020   CHOLHDL 3 02/07/2020   Lab Results  Component Value Date   WBC 6.6 03/21/2020   HGB 13.7 03/21/2020   HCT 40.7 03/21/2020   MCV 86 03/21/2020   PLT 260 03/21/2020   Lab Results  Component Value Date   IRON 70 03/21/2020   TIBC 298 03/21/2020   FERRITIN 26 03/21/2020   Attestation Statements:   Reviewed by clinician on day of visit: allergies, medications, problem list, medical history, surgical history, family history, social history, and previous encounter notes.  I, Water quality scientist, CMA, am acting as transcriptionist for Briscoe Deutscher, DO  I have reviewed the above documentation for accuracy and completeness, and I agree with the above. Briscoe Deutscher, DO

## 2020-08-13 ENCOUNTER — Other Ambulatory Visit (INDEPENDENT_AMBULATORY_CARE_PROVIDER_SITE_OTHER): Payer: Self-pay | Admitting: Family Medicine

## 2020-08-13 DIAGNOSIS — G4709 Other insomnia: Secondary | ICD-10-CM

## 2020-08-13 NOTE — Telephone Encounter (Signed)
Dr.Wallace °

## 2020-08-26 ENCOUNTER — Other Ambulatory Visit: Payer: Self-pay

## 2020-08-26 ENCOUNTER — Encounter (INDEPENDENT_AMBULATORY_CARE_PROVIDER_SITE_OTHER): Payer: Self-pay | Admitting: Family Medicine

## 2020-08-26 ENCOUNTER — Ambulatory Visit (INDEPENDENT_AMBULATORY_CARE_PROVIDER_SITE_OTHER): Payer: BC Managed Care – PPO | Admitting: Family Medicine

## 2020-08-26 VITALS — BP 142/83 | HR 59 | Temp 98.1°F | Ht 68.0 in | Wt 278.0 lb

## 2020-08-26 DIAGNOSIS — I10 Essential (primary) hypertension: Secondary | ICD-10-CM | POA: Diagnosis not present

## 2020-08-26 DIAGNOSIS — Z6841 Body Mass Index (BMI) 40.0 and over, adult: Secondary | ICD-10-CM

## 2020-08-26 DIAGNOSIS — G4709 Other insomnia: Secondary | ICD-10-CM | POA: Diagnosis not present

## 2020-08-26 DIAGNOSIS — F3289 Other specified depressive episodes: Secondary | ICD-10-CM

## 2020-08-26 NOTE — Progress Notes (Signed)
Chief Complaint:   OBESITY Alexa Anderson is here to discuss her progress with her obesity treatment plan along with follow-up of her obesity related diagnoses.   Today's visit was #: 11 Starting weight: 290 lbs Starting date: 03/21/2020 Today's weight: 278 lbs Today's date: 08/26/2020 Total lbs lost to date: 12 lbs There is no height or weight on file to calculate BMI.  Total weight loss percentage to date: -4.14%  Interim History: Alexa Anderson's last day with her current job is on November 30, and she will start her new job on December 6, but may not have insurance for 30 days. Nutrition Plan: keeping a food journal and adhering to recommended goals of 1800 calories and 100 grams of protein.  Activity: Walking and cardio for 30 minutes 4 times per week. Achievements: Alexa Anderson will be leaving a toxic work environment soon. She has a job offer and will start this new position early December. She recently heard from [employer], which would be a dream job, and is waiting to hear back. She had another date with a man that she did not care for, felt that she was abandoned by him at the bar they were at. Though she was feeling emotional, she did NOT stop to eat (specifically [employer]). She continues to work on healthy habits and self-acceptance.   Assessment/Plan:   1. Other insomnia Recommend sleep hygiene measures including regular sleep schedule, optimal sleep environment, and relaxing presleep rituals. Medication: Trazodone 50 mg as needed at bedtime. The current medical regimen is effective;  continue present plan and medications.  2. Essential hypertension Elevated today.  Medications: Norvasc, propranolol.   Plan: Avoid buying foods that are: processed, frozen, or prepackaged to avoid excess salt. We will continue to monitor symptoms as they relate to her weight loss journey.  BP Readings from Last 3 Encounters:  08/26/20 (!) 142/83  08/12/20 118/79  08/01/20 126/84   Lab Results  Component  Value Date   CREATININE 0.70 03/21/2020   3. Other depression, with emotional eating Improving. Alexa Anderson is taking Lexapro 10 mg daily. Discussed social challenges and choosing foods in social situations. Discussed assertive communication skills for coping with social relationships, relapse prevention, and possible challenges ahead and how to overcome.  4. Class 3 severe obesity with serious comorbidity and body mass index (BMI) of 40.0 to 44.9 in adult, unspecified obesity type (Alexa Anderson)  Course: Alexa Anderson is currently in the action stage of change. As such, her goal is to continue with weight loss efforts.   Nutrition goals: She has agreed to keeping a food journal and adhering to recommended goals of 1800 calories and 100 grams of protein.   Exercise goals: For substantial health benefits, adults should do at least 150 minutes (2 hours and 30 minutes) a week of moderate-intensity, or 75 minutes (1 hour and 15 minutes) a week of vigorous-intensity aerobic physical activity, or an equivalent combination of moderate- and vigorous-intensity aerobic activity. Aerobic activity should be performed in episodes of at least 10 minutes, and preferably, it should be spread throughout the week.  Behavioral modification strategies: increasing lean protein intake, decreasing simple carbohydrates, increasing vegetables, increasing water intake and holiday eating strategies .  Alexa Anderson has agreed to follow-up with our clinic in 4 weeks. She was informed of the importance of frequent follow-up visits to maximize her success with intensive lifestyle modifications for her multiple health conditions.   Objective:   Blood pressure (!) 142/83, pulse (!) 59, temperature 98.1 F (36.7 C), temperature source  Oral, SpO2 97 %. There is no height or weight on file to calculate BMI.  General: Cooperative, alert, well developed, in no acute distress. HEENT: Conjunctivae and lids unremarkable. Cardiovascular: Regular rhythm.   Lungs: Normal work of breathing. Neurologic: No focal deficits.   Lab Results  Component Value Date   CREATININE 0.70 03/21/2020   BUN 13 03/21/2020   NA 140 03/21/2020   K 4.1 03/21/2020   CL 103 03/21/2020   CO2 24 03/21/2020   Lab Results  Component Value Date   ALT 17 03/21/2020   AST 17 03/21/2020   ALKPHOS 124 (H) 03/21/2020   BILITOT 0.4 03/21/2020   Lab Results  Component Value Date   HGBA1C 5.4 03/21/2020   HGBA1C 5.3 09/05/2013   Lab Results  Component Value Date   INSULIN 8.9 03/21/2020   Lab Results  Component Value Date   TSH 2.090 03/21/2020   Lab Results  Component Value Date   CHOL 172 02/07/2020   HDL 57.10 02/07/2020   LDLCALC 102 (H) 02/07/2020   TRIG 66.0 02/07/2020   CHOLHDL 3 02/07/2020   Lab Results  Component Value Date   WBC 6.6 03/21/2020   HGB 13.7 03/21/2020   HCT 40.7 03/21/2020   MCV 86 03/21/2020   PLT 260 03/21/2020   Lab Results  Component Value Date   IRON 70 03/21/2020   TIBC 298 03/21/2020   FERRITIN 26 03/21/2020   Attestation Statements:   Reviewed by clinician on day of visit: allergies, medications, problem list, medical history, surgical history, family history, social history, and previous encounter notes.  I, Water quality scientist, CMA, am acting as transcriptionist for Briscoe Deutscher, DO  I have reviewed the above documentation for accuracy and completeness, and I agree with the above. Briscoe Deutscher, DO

## 2020-09-10 ENCOUNTER — Ambulatory Visit (INDEPENDENT_AMBULATORY_CARE_PROVIDER_SITE_OTHER): Payer: BC Managed Care – PPO | Admitting: Family Medicine

## 2020-11-12 ENCOUNTER — Ambulatory Visit: Payer: BC Managed Care – PPO | Admitting: Obstetrics and Gynecology

## 2020-11-12 ENCOUNTER — Other Ambulatory Visit (INDEPENDENT_AMBULATORY_CARE_PROVIDER_SITE_OTHER): Payer: Self-pay | Admitting: Family Medicine

## 2020-11-12 DIAGNOSIS — F418 Other specified anxiety disorders: Secondary | ICD-10-CM

## 2020-11-12 MED ORDER — ESCITALOPRAM OXALATE 10 MG PO TABS: 10.0000 mg | ORAL_TABLET | Freq: Every morning | ORAL | 0 refills | Status: DC

## 2020-11-12 NOTE — Telephone Encounter (Signed)
Dr Wallace pt °

## 2020-11-12 NOTE — Telephone Encounter (Signed)
Please advise 

## 2020-11-21 ENCOUNTER — Ambulatory Visit (INDEPENDENT_AMBULATORY_CARE_PROVIDER_SITE_OTHER): Payer: BC Managed Care – PPO | Admitting: Family Medicine

## 2020-11-26 ENCOUNTER — Ambulatory Visit: Payer: BC Managed Care – PPO | Admitting: Obstetrics and Gynecology

## 2020-11-26 ENCOUNTER — Encounter: Payer: Self-pay | Admitting: Obstetrics and Gynecology

## 2020-11-26 ENCOUNTER — Other Ambulatory Visit: Payer: Self-pay

## 2020-11-26 VITALS — BP 130/80

## 2020-11-26 DIAGNOSIS — N951 Menopausal and female climacteric states: Secondary | ICD-10-CM

## 2020-11-26 DIAGNOSIS — N852 Hypertrophy of uterus: Secondary | ICD-10-CM | POA: Diagnosis not present

## 2020-11-26 NOTE — Progress Notes (Signed)
   Alexa Anderson 06-07-1971 [id number]  SUBJECTIVE:  50 y.o. G24P1011 female presents for follow-up 6 month interval examination of a significantly enlarged fibroid uterus.  We did get a pelvic MRI to further characterize the uterus which appears to contain large fibroids (largest is in right fundal area 15.8 x 9.7 x 12.9 cm).  She remarkably has had no abdominal pain or pressure symptoms.  Her periods have been fairly normal and not heavy or irregular.  Also having hot flashes and night sweats a few times per week.   Current Outpatient Medications  Medication Sig Dispense Refill  . amLODipine (NORVASC) 5 MG tablet Take 1 tablet (5 mg total) by mouth daily. 30 tablet 11  . escitalopram (LEXAPRO) 10 MG tablet Take 1 tablet (10 mg total) by mouth in the morning. 90 tablet 0  . propranolol (INDERAL) 20 MG tablet Take 1 tablet (20 mg total) by mouth 3 (three) times daily. 90 tablet 2  . traZODone (DESYREL) 50 MG tablet Take 1 tablet (50 mg total) by mouth at bedtime as needed for sleep. 30 tablet 0   No current facility-administered medications for this visit.   Allergies: Bee venom  Patient's last menstrual period was 11/20/2020.  Past medical history,surgical history, problem list, medications, allergies, family history and social history were all reviewed and documented as reviewed in the EPIC chart.   ROS:  Feeling well. No dyspnea or chest pain on exertion.  No abdominal pain, change in bowel habits, black or bloody stools.  No urinary tract symptoms. GYN ROS: no abnormal bleeding, pelvic pain or discharge.   OBJECTIVE:  BP 130/80 (BP Location: Right Arm, Patient Position: Sitting, Cuff Size: Large)   LMP 11/20/2020  The patient appears well, alert, oriented x 3, in no distress. PELVIC EXAM: UTERUS: uterus is nontender, enlarged to 18 week's size (stable) ADNEXA: Difficult to palpate, but no apparent tenderness  Chaperone: Wandra Scot Bonham present during the  examination  ASSESSMENT:  50 y.o. G2P1011 here for pelvic exam surveillance for enlarged fibroid uterus, also mild vasomotor symptoms presumably from perimenopause  PLAN:  Patient is reassured that given the asymptomatic enlarged fibroid uterus and demonstrated stability on serial exams, I would not recommend hysterectomy at this point, and she is in agreement with this logic.  If she does develop any heavy/prolonged menstrual bleeding patterns, or dysmenorrhea, or any abdominal pain/pressure, appetite change, bowel movement changes, she should come back for evaluation.  We discussed use of Lexapro can be effective at treating hot flashes.  We discussed expected perimenopausal symptoms.  If the symptoms get worse then we could discuss other options. I think annual follow up with routine gyn exam is sufficient at this point unless any new concerns arise.  The patient is aware that I will only be at this practice until early March 2022 so she knows to make sure she requests any needed follow-up when I am no longer at the practice. Joseph Pierini MD 11/26/20

## 2020-12-10 ENCOUNTER — Encounter (INDEPENDENT_AMBULATORY_CARE_PROVIDER_SITE_OTHER): Payer: Self-pay | Admitting: Family Medicine

## 2020-12-10 ENCOUNTER — Ambulatory Visit (INDEPENDENT_AMBULATORY_CARE_PROVIDER_SITE_OTHER): Payer: BC Managed Care – PPO | Admitting: Family Medicine

## 2020-12-10 ENCOUNTER — Other Ambulatory Visit: Payer: Self-pay

## 2020-12-10 VITALS — BP 151/91 | HR 62 | Temp 98.0°F | Ht 68.0 in | Wt 285.0 lb

## 2020-12-10 DIAGNOSIS — G4709 Other insomnia: Secondary | ICD-10-CM

## 2020-12-10 DIAGNOSIS — F418 Other specified anxiety disorders: Secondary | ICD-10-CM

## 2020-12-10 DIAGNOSIS — Z9189 Other specified personal risk factors, not elsewhere classified: Secondary | ICD-10-CM | POA: Diagnosis not present

## 2020-12-10 DIAGNOSIS — Z6841 Body Mass Index (BMI) 40.0 and over, adult: Secondary | ICD-10-CM

## 2020-12-10 DIAGNOSIS — F3289 Other specified depressive episodes: Secondary | ICD-10-CM | POA: Diagnosis not present

## 2020-12-15 ENCOUNTER — Encounter (INDEPENDENT_AMBULATORY_CARE_PROVIDER_SITE_OTHER): Payer: Self-pay | Admitting: Family Medicine

## 2020-12-15 MED ORDER — TRAZODONE HCL 50 MG PO TABS: 50.0000 mg | ORAL_TABLET | Freq: Every evening | ORAL | 0 refills | Status: DC | PRN

## 2020-12-15 MED ORDER — PROPRANOLOL HCL 20 MG PO TABS: 20.0000 mg | ORAL_TABLET | Freq: Three times a day (TID) | ORAL | 2 refills | Status: DC

## 2020-12-15 NOTE — Progress Notes (Signed)
Chief Complaint:   OBESITY Alexa Anderson is here to discuss her progress with her obesity treatment plan along with follow-up of her obesity related diagnoses.   Today's visit was #: 12 Starting weight: 290 lbs Starting date: 03/21/2020 Today's weight: 285 lbs Today's date: 12/10/2020 Total lbs lost to date: 5 lbs Body mass index is 43.33 kg/m.  Total weight loss percentage to date: -1.72%  Interim History:  Alexa Anderson has started a new job and says she loves it.  She says she has a new work-life balance.  She has been eating out for each lunch.  Current Meal Plan: keeping a food journal and adhering to recommended goals of 1800 calories and 100 grams of protein for 40-50% of the time.  Current Exercise Plan: Increased activity.  Assessment/Plan:   1. Other insomnia This is poorly controlled.    Plan: Recommend sleep hygiene measures including regular sleep schedule, optimal sleep environment, and relaxing presleep rituals.   - Start traZODone (DESYREL) 50 MG tablet; Take 1 tablet (50 mg total) by mouth at bedtime as needed for sleep.  Dispense: 30 tablet; Refill: 0  2. Situational anxiety Behavior modification techniques were discussed today to help Alexa Anderson deal with her anxiety.  She is taking propranolol 20 mg three times daily as needed for anxiety.  Will refill today.  - Refill propranolol (INDERAL) 20 MG tablet; Take 1 tablet (20 mg total) by mouth 3 (three) times daily.  Dispense: 90 tablet; Refill: 2  3. Other depression, with emotional eating Improving, but not optimized. Medication: Lexapro 10 mg daily.  Plan:  Behavior modification techniques were discussed today to help deal with emotional/non-hunger eating behaviors.  4. At risk for heart disease Due to Alexa Anderson's current state of health and medical condition(s), she is at a higher risk for heart disease.  This puts the patient at much greater risk to subsequently develop cardiopulmonary conditions that can significantly  affect patient's quality of life in a negative manner. At least 8 minutes were spent on counseling Alexa Anderson about these concerns today. Evidence-based interventions for health behavior change were utilized today including the discussion of self monitoring techniques, problem-solving barriers, and SMART goal setting techniques.  Specifically, regarding patient's less desirable eating habits and patterns, we employed the technique of small changes when Alexa Anderson has not been able to fully commit to her prudent nutritional plan.  5. Class 3 severe obesity with serious comorbidity and body mass index (BMI) of 40.0 to 44.9 in adult, unspecified obesity type (Alexa Anderson)  Course: Alexa Anderson is currently in the action stage of change. As such, her goal is to continue with weight loss efforts.   Nutrition goals: She has agreed to keeping a food journal and adhering to recommended goals of 1800 calories and 100 grams of protein.   Exercise goals: For substantial health benefits, adults should do at least 150 minutes (2 hours and 30 minutes) a week of moderate-intensity, or 75 minutes (1 hour and 15 minutes) a week of vigorous-intensity aerobic physical activity, or an equivalent combination of moderate- and vigorous-intensity aerobic activity. Aerobic activity should be performed in episodes of at least 10 minutes, and preferably, it should be spread throughout the week.  Behavioral modification strategies: increasing lean protein intake, decreasing simple carbohydrates, increasing vegetables, increasing water intake and decreasing liquid calories.  Alexa Anderson has agreed to follow-up with our clinic in 2 weeks. She was informed of the importance of frequent follow-up visits to maximize her success with intensive lifestyle modifications for her  multiple health conditions.   Objective:   Blood pressure (!) 151/91, pulse 62, temperature 98 F (36.7 C), temperature source Oral, height 5\' 8"  (1.727 m), weight 285 lb (129.3 kg), last  menstrual period 11/20/2020, SpO2 96 %. Body mass index is 43.33 kg/m.  General: Cooperative, alert, well developed, in no acute distress. HEENT: Conjunctivae and lids unremarkable. Cardiovascular: Regular rhythm.  Lungs: Normal work of breathing. Neurologic: No focal deficits.   Lab Results  Component Value Date   CREATININE 0.70 03/21/2020   BUN 13 03/21/2020   NA 140 03/21/2020   K 4.1 03/21/2020   CL 103 03/21/2020   CO2 24 03/21/2020   Lab Results  Component Value Date   ALT 17 03/21/2020   AST 17 03/21/2020   ALKPHOS 124 (H) 03/21/2020   BILITOT 0.4 03/21/2020   Lab Results  Component Value Date   HGBA1C 5.4 03/21/2020   HGBA1C 5.3 09/05/2013   Lab Results  Component Value Date   INSULIN 8.9 03/21/2020   Lab Results  Component Value Date   TSH 2.090 03/21/2020   Lab Results  Component Value Date   CHOL 172 02/07/2020   HDL 57.10 02/07/2020   LDLCALC 102 (H) 02/07/2020   TRIG 66.0 02/07/2020   CHOLHDL 3 02/07/2020   Lab Results  Component Value Date   WBC 6.6 03/21/2020   HGB 13.7 03/21/2020   HCT 40.7 03/21/2020   MCV 86 03/21/2020   PLT 260 03/21/2020   Lab Results  Component Value Date   IRON 70 03/21/2020   TIBC 298 03/21/2020   FERRITIN 26 03/21/2020   Attestation Statements:   Reviewed by clinician on day of visit: allergies, medications, problem list, medical history, surgical history, family history, social history, and previous encounter notes.  I, Water quality scientist, CMA, am acting as transcriptionist for Briscoe Deutscher, DO  I have reviewed the above documentation for accuracy and completeness, and I agree with the above. Briscoe Deutscher, DO

## 2020-12-26 ENCOUNTER — Other Ambulatory Visit: Payer: Self-pay

## 2020-12-26 ENCOUNTER — Encounter (INDEPENDENT_AMBULATORY_CARE_PROVIDER_SITE_OTHER): Payer: Self-pay | Admitting: Family Medicine

## 2020-12-26 ENCOUNTER — Other Ambulatory Visit (INDEPENDENT_AMBULATORY_CARE_PROVIDER_SITE_OTHER): Payer: Self-pay | Admitting: Family Medicine

## 2020-12-26 ENCOUNTER — Ambulatory Visit (INDEPENDENT_AMBULATORY_CARE_PROVIDER_SITE_OTHER): Payer: BC Managed Care – PPO | Admitting: Family Medicine

## 2020-12-26 VITALS — BP 121/86 | HR 72 | Temp 98.0°F | Ht 68.0 in | Wt 284.0 lb

## 2020-12-26 DIAGNOSIS — F411 Generalized anxiety disorder: Secondary | ICD-10-CM

## 2020-12-26 DIAGNOSIS — R062 Wheezing: Secondary | ICD-10-CM | POA: Diagnosis not present

## 2020-12-26 DIAGNOSIS — E559 Vitamin D deficiency, unspecified: Secondary | ICD-10-CM | POA: Diagnosis not present

## 2020-12-26 DIAGNOSIS — Z6841 Body Mass Index (BMI) 40.0 and over, adult: Secondary | ICD-10-CM

## 2020-12-26 DIAGNOSIS — R632 Polyphagia: Secondary | ICD-10-CM

## 2020-12-26 DIAGNOSIS — I10 Essential (primary) hypertension: Secondary | ICD-10-CM | POA: Diagnosis not present

## 2020-12-26 DIAGNOSIS — E8881 Metabolic syndrome: Secondary | ICD-10-CM

## 2020-12-26 DIAGNOSIS — N926 Irregular menstruation, unspecified: Secondary | ICD-10-CM

## 2020-12-26 DIAGNOSIS — N951 Menopausal and female climacteric states: Secondary | ICD-10-CM | POA: Diagnosis not present

## 2020-12-26 DIAGNOSIS — R790 Abnormal level of blood mineral: Secondary | ICD-10-CM

## 2020-12-26 DIAGNOSIS — Z9189 Other specified personal risk factors, not elsewhere classified: Secondary | ICD-10-CM

## 2020-12-26 DIAGNOSIS — E538 Deficiency of other specified B group vitamins: Secondary | ICD-10-CM

## 2020-12-26 MED ORDER — ESCITALOPRAM OXALATE 20 MG PO TABS: 20.0000 mg | ORAL_TABLET | Freq: Every day | ORAL | 0 refills | Status: DC

## 2020-12-26 MED ORDER — ALBUTEROL SULFATE HFA 108 (90 BASE) MCG/ACT IN AERS: 1.0000 | INHALATION_SPRAY | Freq: Four times a day (QID) | RESPIRATORY_TRACT | 0 refills | Status: DC | PRN

## 2020-12-26 MED ORDER — MONTELUKAST SODIUM 10 MG PO TABS: 10.0000 mg | ORAL_TABLET | Freq: Every day | ORAL | 0 refills | Status: DC

## 2020-12-26 MED ORDER — WEGOVY 0.25 MG/0.5ML ~~LOC~~ SOAJ: 0.2500 mg | SUBCUTANEOUS | 0 refills | Status: DC

## 2020-12-26 NOTE — Telephone Encounter (Signed)
Will do PA

## 2021-01-01 NOTE — Progress Notes (Signed)
Chief Complaint:   OBESITY Alexa Anderson is here to discuss her progress with her obesity treatment plan along with follow-up of her obesity related diagnoses.   Today's visit was #: 13 Starting weight: 290 lbs Starting date: 03/21/2020 Today's weight: 284 lbs Today's date: 12/26/2020 Total lbs lost to date: 6 lbs Body mass index is 43.18 kg/m.  Total weight loss percentage to date: -2.07%  Interim History:  Amiee is having difficulty with allergies.  She endorses increased anxiety.  She says that protein shakes cause increased diarrhea.  She reports having a hemorrhoid. Current Meal Plan: keeping a food journal and adhering to recommended goals of 1800 calories and 100 grams of protein for 90% of the time.  Current Exercise Plan: Increased walking.  Assessment/Plan:   1. Polyphagia Not at goal. Current treatment: None. Polyphagia refers to excessive feelings of hunger. She will continue to focus on protein-rich, low simple carbohydrate foods. We reviewed the importance of hydration, regular exercise for stress reduction, and restorative sleep.  - Start Semaglutide-Weight Management (WEGOVY) 0.25 MG/0.5ML SOAJ; Inject 0.25 mg into the skin once a week.  Dispense: 2 mL; Refill: 0  2. Wheeze Worsening. We discussed symptomatic care and red flags.  - Start montelukast (SINGULAIR) 10 MG tablet; Take 1 tablet (10 mg total) by mouth at bedtime.  Dispense: 30 tablet; Refill: 0 - Start albuterol (VENTOLIN HFA) 108 (90 Base) MCG/ACT inhaler; Inhale 1-2 puffs into the lungs every 6 (six) hours as needed for wheezing or shortness of breath.  Dispense: 16 g; Refill: 0  3. Menopausal vasomotor syndrome With irregular menses for nearly 1 year.  Women with obesity may experience more severe vasomotor symptoms at menopause than women with overweight or normal weight. We will continue to monitor symptoms as they relate to her weight loss journey.  4. Essential hypertension Not at goal.  Medications: Norvasc 5 mg daily.   Plan: Avoid buying foods that are: processed, frozen, or prepackaged to avoid excess salt. We will continue to monitor closely alongside her PCP and/or Specialist.  Regular follow up with PCP and specialists was also encouraged.  Will check labs today.  BP Readings from Last 3 Encounters:  12/26/20 121/86  12/10/20 (!) 151/91  11/26/20 130/80   Lab Results  Component Value Date   CREATININE 0.70 03/21/2020   5. Low ferritin Nutrition: Iron-rich foods include dark leafy greens, red and white meats, eggs, seafood, and beans.  Certain foods and drinks prevent your body from absorbing iron properly. Avoid eating these foods in the same meal as iron-rich foods or with iron supplements. These foods include: coffee, black tea, and red wine; milk, dairy products, and foods that are high in calcium; beans and soybeans; whole grains. Constipation can be a side effect of iron supplementation. Increased water and fiber intake are helpful. Water goal: > 2 liters/day. Fiber goal: > 25 grams/day.  Will check labs today.  6. B12 deficiency Lab Results  Component Value Date   [id number] 03/21/2020   Will check vitamin B12 level today.  She is not taking a B12 supplement.   7. Irregular menses Irregular menses for 1 nearly 1 year.  Check labs today.  8. GAD (generalized anxiety disorder) Lashala is taking Lexapro 10 mg daily and propranolol 20 mg three times daily.  Plan:  Increase Lexapro to 20 mg daily, as per below.  Will refill today, as per below.  Behavior modification techniques were discussed today to help Deloise deal with her  anxiety.  Orders and follow up as documented in patient record.   - Increase escitalopram (LEXAPRO) 20 MG tablet; Take 1 tablet (20 mg total) by mouth daily.  Dispense: 30 tablet; Refill: 0  9. At risk for heart disease Due to Trinia's current state of health and medical condition(s), she is at a higher risk for heart disease.  This  puts the patient at much greater risk to subsequently develop cardiopulmonary conditions that can significantly affect patient's quality of life in a negative manner.    At least 8 minutes were spent on counseling Alaria about these concerns today. Evidence-based interventions for health behavior change were utilized today including the discussion of self monitoring techniques, problem-solving barriers, and SMART goal setting techniques.  Specifically, regarding patient's less desirable eating habits and patterns, we employed the technique of small changes when Razan has not been able to fully commit to her prudent nutritional plan.  10. Obesity, current BMI 43.2  Course: Catalyna is currently in the action stage of change. As such, her goal is to continue with weight loss efforts.   Nutrition goals: She has agreed to keeping a food journal and adhering to recommended goals of 1800 calories and 100 grams of protein.   Exercise goals: For substantial health benefits, adults should do at least 150 minutes (2 hours and 30 minutes) a week of moderate-intensity, or 75 minutes (1 hour and 15 minutes) a week of vigorous-intensity aerobic physical activity, or an equivalent combination of moderate- and vigorous-intensity aerobic activity. Aerobic activity should be performed in episodes of at least 10 minutes, and preferably, it should be spread throughout the week.  Behavioral modification strategies: increasing lean protein intake, decreasing simple carbohydrates, increasing vegetables and increasing water intake.  Gailene has agreed to follow-up with our clinic in 3 weeks. She was informed of the importance of frequent follow-up visits to maximize her success with intensive lifestyle modifications for her multiple health conditions.   Orders Placed This Encounter  Procedures  . Anemia panel  . CBC with Differential/Platelet  . Comprehensive metabolic panel  . Follicle stimulating hormone  . Hemoglobin  A1c  . Insulin, random  . Lipid panel  . Luteinizing hormone  . VITAMIN D 25 Hydroxy (Vit-D Deficiency, Fractures)  . TSH  . T4, free   Objective:   Blood pressure 121/86, pulse 72, temperature 98 F (36.7 C), temperature source Oral, height 5\' 8"  (1.727 m), weight 284 lb (128.8 kg), SpO2 97 %. Body mass index is 43.18 kg/m.  General: Cooperative, alert, well developed, in no acute distress. HEENT: Conjunctivae and lids unremarkable. Cardiovascular: Regular rhythm.  Lungs: Normal work of breathing. Neurologic: No focal deficits.   Lab Results  Component Value Date   CREATININE 0.70 03/21/2020   BUN 13 03/21/2020   NA 140 03/21/2020   K 4.1 03/21/2020   CL 103 03/21/2020   CO2 24 03/21/2020   Lab Results  Component Value Date   ALT 17 03/21/2020   AST 17 03/21/2020   ALKPHOS 124 (H) 03/21/2020   BILITOT 0.4 03/21/2020   Lab Results  Component Value Date   HGBA1C 5.4 03/21/2020   HGBA1C 5.3 09/05/2013   Lab Results  Component Value Date   INSULIN 8.9 03/21/2020   Lab Results  Component Value Date   TSH 2.090 03/21/2020   Lab Results  Component Value Date   CHOL 172 02/07/2020   HDL 57.10 02/07/2020   LDLCALC 102 (H) 02/07/2020   TRIG 66.0 02/07/2020  CHOLHDL 3 02/07/2020   Lab Results  Component Value Date   WBC 6.6 03/21/2020   HGB 13.7 03/21/2020   HCT 40.7 03/21/2020   MCV 86 03/21/2020   PLT 260 03/21/2020   Lab Results  Component Value Date   IRON 70 03/21/2020   TIBC 298 03/21/2020   FERRITIN 26 03/21/2020   Attestation Statements:   Reviewed by clinician on day of visit: allergies, medications, problem list, medical history, surgical history, family history, social history, and previous encounter notes.  I, Water quality scientist, CMA, am acting as transcriptionist for Briscoe Deutscher, DO  I have reviewed the above documentation for accuracy and completeness, and I agree with the above. Briscoe Deutscher, DO

## 2021-01-02 ENCOUNTER — Encounter (INDEPENDENT_AMBULATORY_CARE_PROVIDER_SITE_OTHER): Payer: Self-pay

## 2021-01-02 DIAGNOSIS — N926 Irregular menstruation, unspecified: Secondary | ICD-10-CM | POA: Diagnosis not present

## 2021-01-02 DIAGNOSIS — E538 Deficiency of other specified B group vitamins: Secondary | ICD-10-CM | POA: Diagnosis not present

## 2021-01-02 DIAGNOSIS — I10 Essential (primary) hypertension: Secondary | ICD-10-CM | POA: Diagnosis not present

## 2021-01-02 DIAGNOSIS — R790 Abnormal level of blood mineral: Secondary | ICD-10-CM | POA: Diagnosis not present

## 2021-01-02 NOTE — Telephone Encounter (Signed)
[employer] and [employer] [place] for the [facility] was approved for all doses and approved till 05/05/2021 spoke to Heil [id number]  Opt 3

## 2021-01-03 LAB — HEMOGLOBIN A1C
Est. average glucose Bld gHb Est-mCnc: 111 mg/dL
Hgb A1c MFr Bld: 5.5 % (ref 4.8–5.6)

## 2021-01-03 LAB — LIPID PANEL
Chol/HDL Ratio: 2.8 ratio (ref 0.0–4.4)
Cholesterol, Total: 167 mg/dL (ref 100–199)
HDL: 60 mg/dL (ref >39)
LDL Chol Calc (NIH): 97 mg/dL (ref 0–99)
Triglycerides: 46 mg/dL (ref 0–149)
VLDL Cholesterol Cal: 10 mg/dL (ref 5–40)

## 2021-01-03 LAB — COMPREHENSIVE METABOLIC PANEL
ALT: 24 IU/L (ref 0–32)
AST: 25 IU/L (ref 0–40)
Albumin/Globulin Ratio: 1.5 (ref 1.2–2.2)
Albumin: 4.4 g/dL (ref 3.8–4.8)
Alkaline Phosphatase: 116 IU/L (ref 44–121)
BUN/Creatinine Ratio: 21 (ref 9–23)
BUN: 14 mg/dL (ref 6–24)
Bilirubin Total: 0.5 mg/dL (ref 0.0–1.2)
CO2: 22 mmol/L (ref 20–29)
Calcium: 9.5 mg/dL (ref 8.7–10.2)
Chloride: 101 mmol/L (ref 96–106)
Creatinine, Ser: 0.67 mg/dL (ref 0.57–1.00)
Globulin, Total: 2.9 g/dL (ref 1.5–4.5)
Glucose: 89 mg/dL (ref 65–99)
Potassium: 4.6 mmol/L (ref 3.5–5.2)
Sodium: 140 mmol/L (ref 134–144)
Total Protein: 7.3 g/dL (ref 6.0–8.5)
eGFR: 107 mL/min/{1.73_m2} (ref >59)

## 2021-01-03 LAB — CBC WITH DIFFERENTIAL/PLATELET
Basophils Absolute: 0 10*3/uL (ref 0.0–0.2)
Basos: 0 %
EOS (ABSOLUTE): 0.1 10*3/uL (ref 0.0–0.4)
Eos: 2 %
Hemoglobin: 13.6 g/dL (ref 11.1–15.9)
Immature Grans (Abs): 0 10*3/uL (ref 0.0–0.1)
Immature Granulocytes: 0 %
Lymphocytes Absolute: 2.1 10*3/uL (ref 0.7–3.1)
Lymphs: 31 %
MCH: 28.6 pg (ref 26.6–33.0)
MCHC: 33 g/dL (ref 31.5–35.7)
MCV: 87 fL (ref 79–97)
Monocytes Absolute: 0.6 10*3/uL (ref 0.1–0.9)
Monocytes: 9 %
Neutrophils Absolute: 3.8 10*3/uL (ref 1.4–7.0)
Neutrophils: 58 %
Platelets: 255 10*3/uL (ref 150–450)
RBC: 4.76 x10E6/uL (ref 3.77–5.28)
RDW: 12.9 % (ref 11.7–15.4)
WBC: 6.7 10*3/uL (ref 3.4–10.8)

## 2021-01-03 LAB — ANEMIA PANEL
Ferritin: 37 ng/mL (ref 15–150)
Folate, Hemolysate: 346 ng/mL
Folate, RBC: 840 ng/mL (ref >498)
Hematocrit: 41.2 % (ref 34.0–46.6)
Iron Saturation: 24 % (ref 15–55)
Iron: 64 ug/dL (ref 27–159)
Retic Ct Pct: 1.1 % (ref 0.6–2.6)
Total Iron Binding Capacity: 269 ug/dL (ref 250–450)
UIBC: 205 ug/dL (ref 131–425)
Vitamin B-12: 472 pg/mL (ref 232–1245)

## 2021-01-03 LAB — FOLLICLE STIMULATING HORMONE: FSH: 61.4 m[IU]/mL

## 2021-01-03 LAB — LUTEINIZING HORMONE: LH: 52.8 m[IU]/mL

## 2021-01-03 LAB — TSH: TSH: 2.42 u[IU]/mL (ref 0.450–4.500)

## 2021-01-03 LAB — VITAMIN D 25 HYDROXY (VIT D DEFICIENCY, FRACTURES): Vit D, 25-Hydroxy: 32.6 ng/mL (ref 30.0–100.0)

## 2021-01-03 LAB — INSULIN, RANDOM: INSULIN: 9.2 u[IU]/mL (ref 2.6–24.9)

## 2021-01-03 LAB — T4, FREE: Free T4: 1.43 ng/dL (ref 0.82–1.77)

## 2021-01-08 ENCOUNTER — Encounter (INDEPENDENT_AMBULATORY_CARE_PROVIDER_SITE_OTHER): Payer: Self-pay | Admitting: Family Medicine

## 2021-01-08 ENCOUNTER — Other Ambulatory Visit: Payer: Self-pay

## 2021-01-08 ENCOUNTER — Ambulatory Visit (INDEPENDENT_AMBULATORY_CARE_PROVIDER_SITE_OTHER): Payer: BC Managed Care – PPO | Admitting: Family Medicine

## 2021-01-08 VITALS — BP 149/88 | HR 64 | Temp 98.0°F | Ht 68.0 in | Wt 288.0 lb

## 2021-01-08 DIAGNOSIS — R632 Polyphagia: Secondary | ICD-10-CM | POA: Diagnosis not present

## 2021-01-08 DIAGNOSIS — F411 Generalized anxiety disorder: Secondary | ICD-10-CM | POA: Diagnosis not present

## 2021-01-08 DIAGNOSIS — Z9189 Other specified personal risk factors, not elsewhere classified: Secondary | ICD-10-CM

## 2021-01-08 DIAGNOSIS — Z6841 Body Mass Index (BMI) 40.0 and over, adult: Secondary | ICD-10-CM

## 2021-01-08 DIAGNOSIS — R062 Wheezing: Secondary | ICD-10-CM | POA: Diagnosis not present

## 2021-01-14 NOTE — Progress Notes (Signed)
Chief Complaint:   OBESITY Jimya is here to discuss her progress with her obesity treatment plan along with follow-up of her obesity related diagnoses.   Today's visit was #: 14 Starting weight: 290 lbs Starting date: 03/21/2020 Today's weight: 288 lbs Today's date: 01/08/2021 Total lbs lost to date: 2 lbs Body mass index is 43.79 kg/m.  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 Current Meal Plan: keeping a food journal and adhering to recommended goals of 1800 calories and 100 grams of protein 60-70% of the time.  Current Exercise Plan: Increased walking. Current Anti-Obesity Medications: Wegovy 0.25 mg subcutaneously weekly. Side effects: None.  Assessment/Plan:   1. Polyphagia Not at goal. Current treatment: Wegovy 0.25 subcutaneously weekly. Polyphagia refers to excessive feelings of hunger.  Plan: Continue [id number]. She will continue to focus on protein-rich, low simple carbohydrate foods. We reviewed the importance of hydration, regular exercise for stress reduction, and restorative sleep.  2. Wheeze Worsening. We discussed symptomatic care and red flags. We will continue to monitor symptoms as they relate to her weight loss journey.  3. GAD (generalized anxiety disorder) Suellyn is taking Lexapro 20 mg daily and propranolol 20 mg three times daily.  Plan: Continue taking Lexapro and Propranolol. Behavior modification techniques were discussed today to help Sanii deal with her anxiety.  Orders and follow up as documented in patient record.   4. At risk for heart disease Due to Akita's current state of health and medical condition(s), she is at a higher risk for heart disease.  This puts the patient at much greater risk to subsequently develop cardiopulmonary conditions that can significantly affect patient's quality of life in a negative manner.    At least 10 minutes were spent on counseling Verdene about  these concerns today. Evidence-based interventions for health behavior change were utilized today including the discussion of self monitoring techniques, problem-solving barriers, and SMART goal setting techniques.  Specifically, regarding patient's less desirable eating habits and patterns, we employed the technique of small changes when Veralyn has not been able to fully commit to her prudent nutritional plan.  5. Obesity, current BMI 43.8  Course: Reola is currently in the action stage of change. As such, her goal is to continue with weight loss efforts.   Nutrition goals: She has agreed to keeping a food journal and adhering to recommended goals of 1800 calories and 100 grams of protein.   Exercise goals: For substantial health benefits, adults should do at least 150 minutes (2 hours and 30 minutes) a week of moderate-intensity, or 75 minutes (1 hour and 15 minutes) a week of vigorous-intensity aerobic physical activity, or an equivalent combination of moderate- and vigorous-intensity aerobic activity. Aerobic activity should be performed in episodes of at least 10 minutes, and preferably, it should be spread throughout the week.  Behavioral modification strategies: increasing lean protein intake, increasing water intake, decreasing sodium intake, emotional eating strategies and dealing with family or coworker sabotage.  Hadeel has agreed to follow-up with our clinic in 3 weeks. She was informed of the importance of frequent follow-up visits to maximize her success with intensive lifestyle modifications for her multiple health conditions.   Objective:   Blood pressure (!) 149/88, pulse 64, temperature 98 F (36.7 C), temperature source Oral, height 5\' 8"  (1.727 m), weight 288 lb (130.6 kg), SpO2 97 %. Body mass index is 43.79 kg/m.  General: Cooperative, alert, well developed, in no acute distress.  HEENT: Conjunctivae and lids unremarkable. Cardiovascular: Regular rhythm.  Lungs: Normal  work of breathing. Neurologic: No focal deficits.   Lab Results  Component Value Date   CREATININE 0.67 01/02/2021   BUN 14 01/02/2021   NA 140 01/02/2021   K 4.6 01/02/2021   CL 101 01/02/2021   CO2 22 01/02/2021   Lab Results  Component Value Date   ALT 24 01/02/2021   AST 25 01/02/2021   ALKPHOS 116 01/02/2021   BILITOT 0.5 01/02/2021   Lab Results  Component Value Date   HGBA1C 5.5 01/02/2021   HGBA1C 5.4 03/21/2020   HGBA1C 5.3 09/05/2013   Lab Results  Component Value Date   INSULIN 9.2 01/02/2021   INSULIN 8.9 03/21/2020   Lab Results  Component Value Date   TSH 2.420 01/02/2021   Lab Results  Component Value Date   CHOL 167 01/02/2021   HDL 60 01/02/2021   LDLCALC 97 01/02/2021   TRIG 46 01/02/2021   CHOLHDL 2.8 01/02/2021   Lab Results  Component Value Date   WBC 6.7 01/02/2021   HGB 13.6 01/02/2021   HCT 41.2 01/02/2021   MCV 87 01/02/2021   PLT 255 01/02/2021   Lab Results  Component Value Date   IRON 64 01/02/2021   TIBC 269 01/02/2021   FERRITIN 37 01/02/2021   Attestation Statements:   Reviewed by clinician on day of visit: allergies, medications, problem list, medical history, surgical history, family history, social history, and previous encounter notes.  Leodis Binet Friedenbach, CMA, am acting as Location manager for [employer], DO.  I have reviewed the above documentation for accuracy and completeness, and I agree with the above. Briscoe Deutscher, DO

## 2021-01-22 ENCOUNTER — Other Ambulatory Visit: Payer: Self-pay

## 2021-01-22 ENCOUNTER — Ambulatory Visit (INDEPENDENT_AMBULATORY_CARE_PROVIDER_SITE_OTHER): Payer: BC Managed Care – PPO | Admitting: Family Medicine

## 2021-01-22 ENCOUNTER — Encounter (INDEPENDENT_AMBULATORY_CARE_PROVIDER_SITE_OTHER): Payer: Self-pay | Admitting: Family Medicine

## 2021-01-22 VITALS — BP 145/87 | HR 62 | Temp 98.0°F | Ht 68.0 in | Wt 281.0 lb

## 2021-01-22 DIAGNOSIS — Z9189 Other specified personal risk factors, not elsewhere classified: Secondary | ICD-10-CM

## 2021-01-22 DIAGNOSIS — E8881 Metabolic syndrome: Secondary | ICD-10-CM | POA: Diagnosis not present

## 2021-01-22 DIAGNOSIS — Z6841 Body Mass Index (BMI) 40.0 and over, adult: Secondary | ICD-10-CM

## 2021-01-22 DIAGNOSIS — F3289 Other specified depressive episodes: Secondary | ICD-10-CM

## 2021-01-22 DIAGNOSIS — R632 Polyphagia: Secondary | ICD-10-CM | POA: Diagnosis not present

## 2021-01-23 NOTE — Progress Notes (Signed)
Chief Complaint:   OBESITY Alexa Anderson is here to discuss her progress with her obesity treatment plan along with follow-up of her obesity related diagnoses.   Today's visit was #: 15 Starting weight: 290 lbs Starting date: 03/21/2020 Today's weight: 281 lbs Today's date: 01/22/2021 Total lbs lost to date: 9 lbs Body mass index is 42.73 kg/m.  Total weight loss percentage to date: -3.10%  Interim History:  Alexa Anderson says that Xyzal and Singulair are helping with her wheeze.  She has not tried albuterol yet. Current Meal Plan: keeping a food journal and adhering to recommended goals of 1800 calories and 100 grams of protein for 90% of the time.  Current Exercise Plan: Walking for 8,000-10,000 steps per day. Current Anti-Obesity Medications: Ozempic 0.25 mg subcutaneously weekly. Side effects: None.  Assessment/Plan:   1. Polyphagia Current treatment: Ozempic 0.25 mg subcutaneously weekly. Polyphagia refers to excessive feelings of hunger. She will continue to focus on protein-rich, low simple carbohydrate foods. We reviewed the importance of hydration, regular exercise for stress reduction, and restorative sleep.  2. Metabolic syndrome Starting goal: Lose 7-10% of starting weight. She will continue to focus on protein-rich, low simple carbohydrate foods. We reviewed the importance of hydration, regular exercise for stress reduction, and restorative sleep.  We will continue to check lab work every 3 months, with 10% weight loss, or should any other concerns arise.  3. Other depression, with emotional eating Controlled. Medication: Lexapro 20 mg daily.  Plan:  Behavior modification techniques were discussed today to help deal with emotional/non-hunger eating behaviors.  4. At risk for heart disease Due to Munirah's current state of health and medical condition(s), she is at a higher risk for heart disease.  This puts the patient at much greater risk to subsequently develop cardiopulmonary  conditions that can significantly affect patient's quality of life in a negative manner.    At least 8 minutes were spent on counseling Tracyann about these concerns today. Evidence-based interventions for health behavior change were utilized today including the discussion of self monitoring techniques, problem-solving barriers, and SMART goal setting techniques.  Specifically, regarding patient's less desirable eating habits and patterns, we employed the technique of small changes when Alexa Anderson has not been able to fully commit to her prudent nutritional plan.  5. Obesity, current BMI 42.7  Course: Alexa Anderson is currently in the action stage of change. As such, her goal is to continue with weight loss efforts.   Nutrition goals: She has agreed to keeping a food journal and adhering to recommended goals of 1800 calories and 100 grams of protein.   Exercise goals: For substantial health benefits, adults should do at least 150 minutes (2 hours and 30 minutes) a week of moderate-intensity, or 75 minutes (1 hour and 15 minutes) a week of vigorous-intensity aerobic physical activity, or an equivalent combination of moderate- and vigorous-intensity aerobic activity. Aerobic activity should be performed in episodes of at least 10 minutes, and preferably, it should be spread throughout the week.  Behavioral modification strategies: increasing lean protein intake, decreasing simple carbohydrates, increasing vegetables, increasing water intake and decreasing liquid calories.  [name] has agreed to follow-up with our clinic in 3 weeks. She was informed of the importance of frequent follow-up visits to maximize her success with intensive lifestyle modifications for her multiple health conditions.   Objective:   Blood pressure (!) 145/87, pulse 62, temperature 98 F (36.7 C), temperature source Oral, height 5\' 8"  (1.727 m), weight 281 lb (127.5 kg), SpO2  97 %. Body mass index is 42.73 kg/m.  General: Cooperative,  alert, well developed, in no acute distress. HEENT: Conjunctivae and lids unremarkable. Cardiovascular: Regular rhythm.  Lungs: Normal work of breathing. Neurologic: No focal deficits.   Lab Results  Component Value Date   CREATININE 0.67 01/02/2021   BUN 14 01/02/2021   NA 140 01/02/2021   K 4.6 01/02/2021   CL 101 01/02/2021   CO2 22 01/02/2021   Lab Results  Component Value Date   ALT 24 01/02/2021   AST 25 01/02/2021   ALKPHOS 116 01/02/2021   BILITOT 0.5 01/02/2021   Lab Results  Component Value Date   HGBA1C 5.5 01/02/2021   HGBA1C 5.4 03/21/2020   HGBA1C 5.3 09/05/2013   Lab Results  Component Value Date   INSULIN 9.2 01/02/2021   INSULIN 8.9 03/21/2020   Lab Results  Component Value Date   TSH 2.420 01/02/2021   Lab Results  Component Value Date   CHOL 167 01/02/2021   HDL 60 01/02/2021   LDLCALC 97 01/02/2021   TRIG 46 01/02/2021   CHOLHDL 2.8 01/02/2021   Lab Results  Component Value Date   WBC 6.7 01/02/2021   HGB 13.6 01/02/2021   HCT 41.2 01/02/2021   MCV 87 01/02/2021   PLT 255 01/02/2021   Lab Results  Component Value Date   IRON 64 01/02/2021   TIBC 269 01/02/2021   FERRITIN 37 01/02/2021   Attestation Statements:   Reviewed by clinician on day of visit: allergies, medications, problem list, medical history, surgical history, family history, social history, and previous encounter notes.  I, Water quality scientist, CMA, am acting as transcriptionist for Briscoe Deutscher, DO  I have reviewed the above documentation for accuracy and completeness, and I agree with the above. Briscoe Deutscher, DO

## 2021-01-30 ENCOUNTER — Other Ambulatory Visit (INDEPENDENT_AMBULATORY_CARE_PROVIDER_SITE_OTHER): Payer: Self-pay | Admitting: Family Medicine

## 2021-01-30 DIAGNOSIS — F411 Generalized anxiety disorder: Secondary | ICD-10-CM

## 2021-01-30 DIAGNOSIS — R062 Wheezing: Secondary | ICD-10-CM

## 2021-02-05 ENCOUNTER — Other Ambulatory Visit: Payer: Self-pay

## 2021-02-05 ENCOUNTER — Encounter (INDEPENDENT_AMBULATORY_CARE_PROVIDER_SITE_OTHER): Payer: Self-pay | Admitting: Family Medicine

## 2021-02-05 ENCOUNTER — Ambulatory Visit (INDEPENDENT_AMBULATORY_CARE_PROVIDER_SITE_OTHER): Payer: BC Managed Care – PPO | Admitting: Family Medicine

## 2021-02-05 VITALS — BP 135/85 | HR 64 | Temp 98.1°F | Ht 68.0 in | Wt 275.0 lb

## 2021-02-05 DIAGNOSIS — R7301 Impaired fasting glucose: Secondary | ICD-10-CM

## 2021-02-05 DIAGNOSIS — J4540 Moderate persistent asthma, uncomplicated: Secondary | ICD-10-CM

## 2021-02-05 DIAGNOSIS — Z6841 Body Mass Index (BMI) 40.0 and over, adult: Secondary | ICD-10-CM

## 2021-02-05 DIAGNOSIS — Z9189 Other specified personal risk factors, not elsewhere classified: Secondary | ICD-10-CM | POA: Diagnosis not present

## 2021-02-05 MED ORDER — BUDESONIDE-FORMOTEROL FUMARATE 160-4.5 MCG/ACT IN AERO
2.0000 | INHALATION_SPRAY | Freq: Two times a day (BID) | RESPIRATORY_TRACT | 3 refills | Status: DC
  Filled 2021-06-02: qty 10.2, 30d supply, fill #0
  Filled 2021-08-13: qty 10.2, 30d supply, fill #1

## 2021-02-05 MED ORDER — MONTELUKAST SODIUM 10 MG PO TABS: 10.0000 mg | ORAL_TABLET | Freq: Every day | ORAL | 0 refills | Status: DC

## 2021-02-05 MED ORDER — OZEMPIC (0.25 OR 0.5 MG/DOSE) 2 MG/1.5ML ~~LOC~~ SOPN: 0.5000 mg | PEN_INJECTOR | SUBCUTANEOUS | 0 refills | Status: DC

## 2021-02-08 ENCOUNTER — Other Ambulatory Visit: Payer: Self-pay | Admitting: Family Medicine

## 2021-02-11 ENCOUNTER — Other Ambulatory Visit (INDEPENDENT_AMBULATORY_CARE_PROVIDER_SITE_OTHER): Payer: Self-pay | Admitting: Family Medicine

## 2021-02-11 DIAGNOSIS — R7301 Impaired fasting glucose: Secondary | ICD-10-CM

## 2021-02-11 NOTE — Progress Notes (Signed)
Chief Complaint:   OBESITY Alexa Anderson is here to discuss her progress with her obesity treatment plan along with follow-up of her obesity related diagnoses.   Today's visit was #: 16 Starting weight: 290 lbs Starting date: 03/21/2020 Today's weight: 275 lbs Today's date: 02/05/2021 Total lbs lost to date: 15 lbs Body mass index is 41.81 kg/m.  Total weight loss percentage to date: -5.17%  Interim History:  Alexa Anderson says she is doing well with Wegovy. Tolerating without side effects. Hunger and cravings more controlled.  Current Meal Plan: keeping a food journal and adhering to recommended goals of 1800 calories and 100 grams of protein for 90% of the time.  Current Exercise Plan: Increased walking. Current Anti-Obesity Medications: Semaglutide 0.25 mg subcutaneously weekly. Side effects: None.  Assessment/Plan:   1. Moderate persistent asthma without complication Alexa Anderson is taking Singulair 10 mg daily.  She also has albuterol on hand if needed - currently using BID.  Plan:  Start Symbicort and continue Singulair 10 mg daily and albuterol as needed.  - Refill montelukast (SINGULAIR) 10 MG tablet; Take 1 tablet (10 mg total) by mouth at bedtime.  Dispense: 30 tablet; Refill: 0 - Start budesonide-formoterol (SYMBICORT) 160-4.5 MCG/ACT inhaler; Inhale 2 puffs into the lungs 2 (two) times daily.  Dispense: 1 each; Refill: 3  2. Impaired fasting glucose Alexa Anderson is taking Semaglutide 0.25 mg subcutaneously weekly. Plan:  Increase to 0.5 mg subcutaneously weekly, as per below.  - Increase Semaglutide,0.25 or 0.5MG /DOS, 2 MG/1.5ML SOPN; Inject 0.5 mg into the skin once a week.  Dispense: 2 mL; Refill: 0  3. At risk for heart disease Due to Alexa Anderson's current state of health and medical condition(s), she is at a higher risk for heart disease.  This puts the patient at much greater risk to subsequently develop cardiopulmonary conditions that can significantly affect patient's quality of life in  a negative manner.    At least 8 minutes were spent on counseling Alexa Anderson about these concerns today. Evidence-based interventions for health behavior change were utilized today including the discussion of self monitoring techniques, problem-solving barriers, and SMART goal setting techniques.  Specifically, regarding patient's less desirable eating habits and patterns, we employed the technique of small changes when Alexa Anderson has not been able to fully commit to her prudent nutritional plan.  4. Obesity, current BMI 41.9  Course: Alexa Anderson is currently in the action stage of change. As such, her goal is to continue with weight loss efforts.   Nutrition goals: She has agreed to keeping a food journal and adhering to recommended goals of 1800 calories and 100 grams of protein.   Exercise goals: For substantial health benefits, adults should do at least 150 minutes (2 hours and 30 minutes) a week of moderate-intensity, or 75 minutes (1 hour and 15 minutes) a week of vigorous-intensity aerobic physical activity, or an equivalent combination of moderate- and vigorous-intensity aerobic activity. Aerobic activity should be performed in episodes of at least 10 minutes, and preferably, it should be spread throughout the week.  Behavioral modification strategies: increasing lean protein intake, decreasing simple carbohydrates and increasing vegetables.  Alexa Anderson has agreed to follow-up with our clinic in 2-3 weeks. She was informed of the importance of frequent follow-up visits to maximize her success with intensive lifestyle modifications for her multiple health conditions.   Objective:   Blood pressure 135/85, pulse 64, temperature 98.1 F (36.7 C), temperature source Oral, height 5\' 8"  (1.727 m), weight 275 lb (124.7 kg), SpO2 97 %.  Body mass index is 41.81 kg/m.  General: Cooperative, alert, well developed, in no acute distress. HEENT: Conjunctivae and lids unremarkable. Cardiovascular: Regular rhythm.   Lungs: Normal work of breathing. Neurologic: No focal deficits.   Lab Results  Component Value Date   CREATININE 0.67 01/02/2021   BUN 14 01/02/2021   NA 140 01/02/2021   K 4.6 01/02/2021   CL 101 01/02/2021   CO2 22 01/02/2021   Lab Results  Component Value Date   ALT 24 01/02/2021   AST 25 01/02/2021   ALKPHOS 116 01/02/2021   BILITOT 0.5 01/02/2021   Lab Results  Component Value Date   HGBA1C 5.5 01/02/2021   HGBA1C 5.4 03/21/2020   HGBA1C 5.3 09/05/2013   Lab Results  Component Value Date   INSULIN 9.2 01/02/2021   INSULIN 8.9 03/21/2020   Lab Results  Component Value Date   TSH 2.420 01/02/2021   Lab Results  Component Value Date   CHOL 167 01/02/2021   HDL 60 01/02/2021   LDLCALC 97 01/02/2021   TRIG 46 01/02/2021   CHOLHDL 2.8 01/02/2021   Lab Results  Component Value Date   WBC 6.7 01/02/2021   HGB 13.6 01/02/2021   HCT 41.2 01/02/2021   MCV 87 01/02/2021   PLT 255 01/02/2021   Lab Results  Component Value Date   IRON 64 01/02/2021   TIBC 269 01/02/2021   FERRITIN 37 01/02/2021   Attestation Statements:   Reviewed by clinician on day of visit: allergies, medications, problem list, medical history, surgical history, family history, social history, and previous encounter notes.  I, Water quality scientist, CMA, am acting as transcriptionist for Briscoe Deutscher, DO  I have reviewed the above documentation for accuracy and completeness, and I agree with the above. Briscoe Deutscher, DO

## 2021-02-11 NOTE — Telephone Encounter (Signed)
Dr.Wallace °

## 2021-02-12 NOTE — Telephone Encounter (Signed)
Will do PA

## 2021-02-13 ENCOUNTER — Other Ambulatory Visit (INDEPENDENT_AMBULATORY_CARE_PROVIDER_SITE_OTHER): Payer: Self-pay | Admitting: Family Medicine

## 2021-02-13 DIAGNOSIS — R7301 Impaired fasting glucose: Secondary | ICD-10-CM

## 2021-02-13 NOTE — Telephone Encounter (Signed)
Dr.Wallace °

## 2021-02-13 NOTE — Telephone Encounter (Signed)
PA submitted, waiting on insurance

## 2021-02-17 NOTE — Telephone Encounter (Signed)
Ozempic was denied by insurance due to pt not having DM type 2, please advise

## 2021-02-19 ENCOUNTER — Ambulatory Visit (INDEPENDENT_AMBULATORY_CARE_PROVIDER_SITE_OTHER): Payer: BC Managed Care – PPO | Admitting: Family Medicine

## 2021-02-19 ENCOUNTER — Encounter (INDEPENDENT_AMBULATORY_CARE_PROVIDER_SITE_OTHER): Payer: Self-pay | Admitting: Family Medicine

## 2021-02-19 ENCOUNTER — Other Ambulatory Visit (HOSPITAL_BASED_OUTPATIENT_CLINIC_OR_DEPARTMENT_OTHER): Payer: Self-pay

## 2021-02-19 ENCOUNTER — Other Ambulatory Visit: Payer: Self-pay

## 2021-02-19 VITALS — BP 137/85 | HR 61 | Temp 97.9°F | Ht 68.0 in | Wt 275.0 lb

## 2021-02-19 DIAGNOSIS — F411 Generalized anxiety disorder: Secondary | ICD-10-CM

## 2021-02-19 DIAGNOSIS — R632 Polyphagia: Secondary | ICD-10-CM

## 2021-02-19 DIAGNOSIS — E8881 Metabolic syndrome: Secondary | ICD-10-CM

## 2021-02-19 DIAGNOSIS — Z9189 Other specified personal risk factors, not elsewhere classified: Secondary | ICD-10-CM | POA: Diagnosis not present

## 2021-02-19 DIAGNOSIS — R7301 Impaired fasting glucose: Secondary | ICD-10-CM | POA: Diagnosis not present

## 2021-02-19 DIAGNOSIS — Z6841 Body Mass Index (BMI) 40.0 and over, adult: Secondary | ICD-10-CM

## 2021-02-19 MED ORDER — WEGOVY 1 MG/0.5ML ~~LOC~~ SOAJ
1.0000 mg | SUBCUTANEOUS | 0 refills | Status: DC
Start: 2021-02-19 — End: 2021-04-10
  Filled 2021-02-19: qty 2, 28d supply, fill #0

## 2021-02-19 MED ORDER — WEGOVY 0.5 MG/0.5ML ~~LOC~~ SOAJ
0.5000 mg | SUBCUTANEOUS | 0 refills | Status: DC
  Filled 2021-02-19: qty 2, 28d supply, fill #0

## 2021-02-25 NOTE — Progress Notes (Signed)
Chief Complaint:   OBESITY Alexa Anderson is here to discuss her progress with her obesity treatment plan along with follow-up of her obesity related diagnoses.   Today's visit was #: 59 Starting weight: 290 lbs Starting date: 03/21/2020 Today's weight: 275 lbs Today's date: 02/19/2021 Weight change since last visit: 0 Total lbs lost to date: 15 lbs Body mass index is 41.81 kg/m.  Total weight loss percentage to date: -5.17%  Interim History:  Alexa Anderson has been out of medication for 2 weeks (0.25 mg).  Alexa Anderson.   Current Meal Plan: keeping a food journal and adhering to recommended goals of 1800 calories and 100 grams of protein for 80% of the time.  Current Exercise Plan: Increased walking.  Assessment/Plan:    Meds ordered this encounter  Medications  . Semaglutide-Weight Management (WEGOVY) 0.5 MG/0.5ML SOAJ    Sig: Inject 0.5 mg into the skin once a week.    Dispense:  2 mL    Refill:  0  . Semaglutide-Weight Management (WEGOVY) 1 MG/0.5ML SOAJ    Sig: Inject 1 mg into the skin once a week.    Dispense:  2 mL    Refill:  0   1. Metabolic syndrome Starting goal: Lose 7-10% of starting weight. She will continue to focus on protein-rich, low simple carbohydrate foods. We reviewed the importance of hydration, regular exercise for stress reduction, and restorative sleep.  We will continue to check lab work every 3 months, with 10% weight loss, or should any other concerns arise.  2. Polyphagia Not at goal. Current treatment: None. Polyphagia refers to excessive feelings of hunger. She will continue to focus on protein-rich, low simple carbohydrate foods. We reviewed the importance of hydration, regular exercise for stress reduction, and restorative sleep.  CMA coordinated with [facility] to pick up medications. Difficulty due to supply concerns.  - Start Semaglutide-Weight Management (WEGOVY) 0.5 MG/0.5ML SOAJ; Inject 0.5 mg  into the skin once a week.  Dispense: 2 mL; Refill: 0 - Semaglutide-Weight Management (WEGOVY) 1 MG/0.5ML SOAJ; Inject 1 mg into the skin once a week.  Dispense: 2 mL; Refill: 0  3. Impaired fasting glucose Alexa Anderson will start Wegovy 0.5 mg subcutaneously weekly.  4. GAD (generalized anxiety disorder) Behavior modification techniques were discussed today to help Alexa Anderson deal with her anxiety.  5. At risk for heart disease Due to Alexa Anderson's current state of health and medical condition(s), she is at a higher risk for heart disease.  This puts the patient at much greater risk to subsequently develop cardiopulmonary conditions that can significantly affect patient's quality of life in a negative manner.   At least 8 minutes were spent on counseling Alexa Anderson about these concerns today. Evidence-based interventions for health behavior change were utilized today including the discussion of self monitoring techniques, problem-solving barriers, and SMART goal setting techniques.  Specifically, regarding patient's less desirable eating habits and patterns, we employed the technique of small changes when Alexa Anderson has not been able to fully commit to her prudent nutritional plan.  6. Obesity, current BMI 41.9  Course: Alexa Anderson is currently in the action stage of change. As such, her goal is to continue with weight loss efforts.   Nutrition goals: She has agreed to keeping a food journal and adhering to recommended goals of 1800 calories and 100 grams of protein.   Exercise goals: For substantial health benefits, adults should do at least 150 minutes (2 hours and 30  minutes) a week of moderate-intensity, or 75 minutes (1 hour and 15 minutes) a week of vigorous-intensity aerobic physical activity, or an equivalent combination of moderate- and vigorous-intensity aerobic activity. Aerobic activity should be performed in episodes of at least 10 minutes, and preferably, it should be spread throughout the week.  Behavioral  modification strategies: increasing lean protein intake, decreasing simple carbohydrates, increasing vegetables and increasing water intake.  Alexa Anderson has agreed to follow-up with our clinic in 3 weeks. She was informed of the importance of frequent follow-up visits to maximize her success with intensive lifestyle modifications for her multiple health conditions.   Objective:   Blood pressure 137/85, pulse 61, temperature 97.9 F (36.6 C), temperature source Oral, height 5\' 8"  (1.727 m), weight 275 lb (124.7 kg), SpO2 97 %. Body mass index is 41.81 kg/m.  General: Cooperative, alert, Anderson developed, in no acute distress. HEENT: Conjunctivae and lids unremarkable. Cardiovascular: Regular rhythm.  Lungs: Normal work of breathing. Neurologic: No focal deficits.   Lab Results  Component Value Date   CREATININE 0.67 01/02/2021   BUN 14 01/02/2021   NA 140 01/02/2021   K 4.6 01/02/2021   CL 101 01/02/2021   CO2 22 01/02/2021   Lab Results  Component Value Date   ALT 24 01/02/2021   AST 25 01/02/2021   ALKPHOS 116 01/02/2021   BILITOT 0.5 01/02/2021   Lab Results  Component Value Date   HGBA1C 5.5 01/02/2021   HGBA1C 5.4 03/21/2020   HGBA1C 5.3 09/05/2013   Lab Results  Component Value Date   INSULIN 9.2 01/02/2021   INSULIN 8.9 03/21/2020   Lab Results  Component Value Date   TSH 2.420 01/02/2021   Lab Results  Component Value Date   CHOL 167 01/02/2021   HDL 60 01/02/2021   LDLCALC 97 01/02/2021   TRIG 46 01/02/2021   CHOLHDL 2.8 01/02/2021   Lab Results  Component Value Date   WBC 6.7 01/02/2021   HGB 13.6 01/02/2021   HCT 41.2 01/02/2021   MCV 87 01/02/2021   PLT 255 01/02/2021   Lab Results  Component Value Date   IRON 64 01/02/2021   TIBC 269 01/02/2021   FERRITIN 37 01/02/2021   Attestation Statements:   Reviewed by clinician on day of visit: allergies, medications, problem list, medical history, surgical history, family history, social history,  and previous encounter notes.  I, Water quality scientist, CMA, am acting as transcriptionist for Briscoe Deutscher, DO  I have reviewed the above documentation for accuracy and completeness, and I agree with the above. Briscoe Deutscher, DO

## 2021-03-03 ENCOUNTER — Other Ambulatory Visit (INDEPENDENT_AMBULATORY_CARE_PROVIDER_SITE_OTHER): Payer: Self-pay | Admitting: Family Medicine

## 2021-03-03 DIAGNOSIS — J4540 Moderate persistent asthma, uncomplicated: Secondary | ICD-10-CM

## 2021-03-05 ENCOUNTER — Ambulatory Visit (INDEPENDENT_AMBULATORY_CARE_PROVIDER_SITE_OTHER): Payer: BC Managed Care – PPO | Admitting: Family Medicine

## 2021-03-12 ENCOUNTER — Encounter (INDEPENDENT_AMBULATORY_CARE_PROVIDER_SITE_OTHER): Payer: Self-pay

## 2021-03-12 ENCOUNTER — Other Ambulatory Visit: Payer: Self-pay

## 2021-03-12 ENCOUNTER — Ambulatory Visit (INDEPENDENT_AMBULATORY_CARE_PROVIDER_SITE_OTHER): Payer: BC Managed Care – PPO | Admitting: Family Medicine

## 2021-03-12 ENCOUNTER — Encounter (INDEPENDENT_AMBULATORY_CARE_PROVIDER_SITE_OTHER): Payer: Self-pay | Admitting: Family Medicine

## 2021-03-12 VITALS — BP 131/83 | HR 63 | Temp 98.1°F | Ht 68.0 in | Wt 275.0 lb

## 2021-03-12 DIAGNOSIS — F418 Other specified anxiety disorders: Secondary | ICD-10-CM | POA: Diagnosis not present

## 2021-03-12 DIAGNOSIS — Z9189 Other specified personal risk factors, not elsewhere classified: Secondary | ICD-10-CM | POA: Diagnosis not present

## 2021-03-12 DIAGNOSIS — E8881 Metabolic syndrome: Secondary | ICD-10-CM | POA: Diagnosis not present

## 2021-03-12 DIAGNOSIS — J4540 Moderate persistent asthma, uncomplicated: Secondary | ICD-10-CM | POA: Diagnosis not present

## 2021-03-12 DIAGNOSIS — Z6841 Body Mass Index (BMI) 40.0 and over, adult: Secondary | ICD-10-CM

## 2021-03-12 NOTE — Progress Notes (Signed)
Chief Complaint:   OBESITY Alexa Anderson is here to discuss her progress with her obesity treatment plan along with follow-up of her obesity related diagnoses. See Medical Weight Management Flowsheet for bioelectrical impedance results.  Today's visit was #: 62 Starting weight: 290 lbs Starting date: 03/21/2020 Today's weight: 275 lbs Today's date: 03/12/2021 Weight change since last visit: 0 Total lbs lost to date: 15 lbs Body mass index is 41.81 kg/m.  Total weight loss percentage to date: -5.17%  Interim History: Alexa Anderson is doing well.  Happy with progress.  She had her 50th birthday celebration last weekend at [employer].  She says she had a difficult time obtaining her inhaler.  She got it yesterday.  Nutrition Plan: keeping a food journal and adhering to recommended goals of 1800 calories and 100 grams of protein. Activity:  Increased walking. Anti-obesity medications: Wegovy. Reported side effects: None.  Assessment/Plan:   1. Metabolic syndrome Starting goal: Lose 7-10% of starting weight. She will continue to focus on protein-rich, low simple carbohydrate foods. We reviewed the importance of hydration, regular exercise for stress reduction, and restorative sleep.  We will continue to check lab work every 3 months, with 10% weight loss, or should any other concerns arise.  She is taking Wegovy weekly.  Plan:  The current medical regimen is effective;  continue present plan and medications.  2. Moderate persistent asthma without complication Alexa Anderson is taking Singulair 10 mg daily and Symbicort 2 puffs twice daily.  She also has an albuterol inhaler for rescue.  Plan:  The current medical regimen is effective;  continue present plan and medications.  3. Situational anxiety Alexa Anderson takes propranolol 20 mg three times daily as needed for anxiety.  She is also taking Lexapro 20 mg daily.  Behavior modification techniques were discussed today to help Alexa Anderson deal with her anxiety.     4. At risk for nausea Alexa Anderson is at risk for nausea due to increasing her dose of Wegovy. We reviewed ways to decrease side effect of nausea through eating small meals, avoiding high sugar or fat foods, and ultimately going back on the dose if not tolerating. Time > 8 minutes.  5. Obesity, current BMI 41.9  Course: Alexa Anderson is currently in the action stage of change. As such, her goal is to continue with weight loss efforts.   Nutrition goals: She has agreed to keeping a food journal and adhering to recommended goals of 1800 calories and 100 grams of protein.   Exercise goals: For substantial health benefits, adults should do at least 150 minutes (2 hours and 30 minutes) a week of moderate-intensity, or 75 minutes (1 hour and 15 minutes) a week of vigorous-intensity aerobic physical activity, or an equivalent combination of moderate- and vigorous-intensity aerobic activity. Aerobic activity should be performed in episodes of at least 10 minutes, and preferably, it should be spread throughout the week.  Behavioral modification strategies: increasing lean protein intake, decreasing simple carbohydrates, and increasing vegetables.  Alexa Anderson has agreed to follow-up with our clinic in 2 weeks. She was informed of the importance of frequent follow-up visits to maximize her success with intensive lifestyle modifications for her multiple health conditions.   Objective:   Blood pressure 131/83, pulse 63, temperature 98.1 F (36.7 C), temperature source Oral, height 5\' 8"  (7.035 m), weight 275 lb (124.7 kg), SpO2 97 %. Body mass index is 41.81 kg/m.  General: Cooperative, alert, well developed, in no acute distress. HEENT: Conjunctivae and lids unremarkable. Cardiovascular: Regular rhythm.  Lungs: Normal work of breathing. Neurologic: No focal deficits.   Lab Results  Component Value Date   CREATININE 0.67 01/02/2021   BUN 14 01/02/2021   NA 140 01/02/2021   K 4.6 01/02/2021   CL 101  01/02/2021   CO2 22 01/02/2021   Lab Results  Component Value Date   ALT 24 01/02/2021   AST 25 01/02/2021   ALKPHOS 116 01/02/2021   BILITOT 0.5 01/02/2021   Lab Results  Component Value Date   HGBA1C 5.5 01/02/2021   HGBA1C 5.4 03/21/2020   HGBA1C 5.3 09/05/2013   Lab Results  Component Value Date   INSULIN 9.2 01/02/2021   INSULIN 8.9 03/21/2020   Lab Results  Component Value Date   TSH 2.420 01/02/2021   Lab Results  Component Value Date   CHOL 167 01/02/2021   HDL 60 01/02/2021   LDLCALC 97 01/02/2021   TRIG 46 01/02/2021   CHOLHDL 2.8 01/02/2021   Lab Results  Component Value Date   WBC 6.7 01/02/2021   HGB 13.6 01/02/2021   HCT 41.2 01/02/2021   MCV 87 01/02/2021   PLT 255 01/02/2021   Lab Results  Component Value Date   IRON 64 01/02/2021   TIBC 269 01/02/2021   FERRITIN 37 01/02/2021   Attestation Statements:   Reviewed by clinician on day of visit: allergies, medications, problem list, medical history, surgical history, family history, social history, and previous encounter notes.  I, Water quality scientist, CMA, am acting as transcriptionist for Briscoe Deutscher, DO  I have reviewed the above documentation for accuracy and completeness, and I agree with the above. Briscoe Deutscher, DO

## 2021-03-13 ENCOUNTER — Other Ambulatory Visit: Payer: Self-pay | Admitting: Family Medicine

## 2021-03-13 ENCOUNTER — Other Ambulatory Visit (INDEPENDENT_AMBULATORY_CARE_PROVIDER_SITE_OTHER): Payer: Self-pay | Admitting: Family Medicine

## 2021-03-13 DIAGNOSIS — F411 Generalized anxiety disorder: Secondary | ICD-10-CM

## 2021-03-13 NOTE — Telephone Encounter (Signed)
No recent or future appts please advise

## 2021-03-13 NOTE — Telephone Encounter (Signed)
Amlodipine refilled once and will route to [place] to schedule appt

## 2021-03-13 NOTE — Telephone Encounter (Signed)
Please schedule f/u and refill until then  

## 2021-03-17 NOTE — Telephone Encounter (Signed)
DR Wallace 

## 2021-03-19 NOTE — Telephone Encounter (Signed)
LVM to get scheduled.  

## 2021-03-26 ENCOUNTER — Other Ambulatory Visit: Payer: Self-pay

## 2021-03-26 ENCOUNTER — Encounter (INDEPENDENT_AMBULATORY_CARE_PROVIDER_SITE_OTHER): Payer: Self-pay | Admitting: Family Medicine

## 2021-03-26 ENCOUNTER — Ambulatory Visit (INDEPENDENT_AMBULATORY_CARE_PROVIDER_SITE_OTHER): Payer: BC Managed Care – PPO | Admitting: Family Medicine

## 2021-03-26 VITALS — BP 128/85 | HR 75 | Temp 98.4°F | Ht 68.0 in | Wt 274.0 lb

## 2021-03-26 DIAGNOSIS — E8881 Metabolic syndrome: Secondary | ICD-10-CM

## 2021-03-26 DIAGNOSIS — F418 Other specified anxiety disorders: Secondary | ICD-10-CM

## 2021-03-26 DIAGNOSIS — J4540 Moderate persistent asthma, uncomplicated: Secondary | ICD-10-CM | POA: Diagnosis not present

## 2021-03-26 DIAGNOSIS — Z9189 Other specified personal risk factors, not elsewhere classified: Secondary | ICD-10-CM

## 2021-03-26 DIAGNOSIS — Z79899 Other long term (current) drug therapy: Secondary | ICD-10-CM

## 2021-03-26 DIAGNOSIS — Z6841 Body Mass Index (BMI) 40.0 and over, adult: Secondary | ICD-10-CM

## 2021-03-26 MED ORDER — ONDANSETRON 4 MG PO TBDP: 4.0000 mg | ORAL_TABLET | Freq: Three times a day (TID) | ORAL | 0 refills | Status: DC | PRN

## 2021-03-26 NOTE — Progress Notes (Signed)
Chief Complaint:   OBESITY Alexa Anderson is here to discuss her progress with her obesity treatment plan along with follow-up of her obesity related diagnoses. See Medical Weight Management Flowsheet for complete bioelectrical impedance results.  Today's visit was #: 59 Starting weight: 290 lbs Starting date: 03/21/2020 Today's weight: 274 lbs Today's date: 03/26/2021 Weight change since last visit: 1 lb Total lbs lost to date: 16 lbs Body mass index is 41.66 kg/m.  Total weight loss percentage to date: -5.52%  Interim History: Alexa Anderson will be moving this weekend.  She will consider seeing an allergist.  She says the daily inhaler is helping. Nutrition Plan: keeping a food journal and adhering to recommended goals of 1800 calories and 100 grams of protein for 50% of the time. Activity:  Increased walking. Anti-obesity medications: Wegovy 1 mg subcutaneously weekly. Reported side effects: None.  Assessment/Plan:   1. Medication management Start Zofran 4 mg every 8 hours as needed for nausea, as per below.  - Start ondansetron (ZOFRAN ODT) 4 MG disintegrating tablet; Take 1 tablet (4 mg total) by mouth every 8 (eight) hours as needed for nausea or vomiting.  Dispense: 20 tablet; Refill: 0  2. Metabolic syndrome Starting goal: Lose 7-10% of starting weight. She will continue to focus on protein-rich, low simple carbohydrate foods. We reviewed the importance of hydration, regular exercise for stress reduction, and restorative sleep.  We will continue to check lab work every 3 months, with 10% weight loss, or should any other concerns arise.  3. Moderate persistent asthma without complication [name] is taking Singulair 10 mg daily and Symbicort 2 puffs twice daily.  She also has an albuterol inhaler for rescue.   Plan:  The current medical regimen is effective;  continue present plan and medications.  4. Situational anxiety [name] takes propranolol 20 mg three times daily as needed for  anxiety.  She is also taking Lexapro 20 mg daily.   Behavior modification techniques were discussed today to help Alexa Anderson deal with her anxiety.     5. At risk for constipation [name] is at increased risk for constipation due to inadequate water intake, changes in diet, and/or use of certain medications. Patient was provided with 8 minutes of counseling today regarding this condition and related ones.  We discussed preventative OTC therapies that exist such as MiraLAX, stool softeners, etc.   6. Obesity, current BMI 41.7  Course: Alexa Anderson is currently in the action stage of change. As such, her goal is to continue with weight loss efforts.   Nutrition goals: She has agreed to keeping a food journal and adhering to recommended goals of 1800 calories and 100 grams of protein.   Exercise goals:  Increased walking.  Behavioral modification strategies: increasing lean protein intake, decreasing simple carbohydrates, increasing vegetables, and increasing water intake.  [name] has agreed to follow-up with our clinic in 3 weeks. She was informed of the importance of frequent follow-up visits to maximize her success with intensive lifestyle modifications for her multiple health conditions.   Objective:   Blood pressure 128/85, pulse 75, temperature 98.4 F (36.9 C), temperature source Oral, height 5\' 8"  (1.727 m), weight 274 lb (124.3 kg), SpO2 97 %. Body mass index is 41.66 kg/m.  General: Cooperative, alert, well developed, in no acute distress. HEENT: Conjunctivae and lids unremarkable. Cardiovascular: Regular rhythm.  Lungs: Normal work of breathing. Neurologic: No focal deficits.   Lab Results  Component Value Date   CREATININE 0.67 01/02/2021  BUN 14 01/02/2021   NA 140 01/02/2021   K 4.6 01/02/2021   CL 101 01/02/2021   CO2 22 01/02/2021   Lab Results  Component Value Date   ALT 24 01/02/2021   AST 25 01/02/2021   ALKPHOS 116 01/02/2021   BILITOT 0.5 01/02/2021    Lab Results  Component Value Date   HGBA1C 5.5 01/02/2021   HGBA1C 5.4 03/21/2020   HGBA1C 5.3 09/05/2013   Lab Results  Component Value Date   INSULIN 9.2 01/02/2021   INSULIN 8.9 03/21/2020   Lab Results  Component Value Date   TSH 2.420 01/02/2021   Lab Results  Component Value Date   CHOL 167 01/02/2021   HDL 60 01/02/2021   LDLCALC 97 01/02/2021   TRIG 46 01/02/2021   CHOLHDL 2.8 01/02/2021   Lab Results  Component Value Date   WBC 6.7 01/02/2021   HGB 13.6 01/02/2021   HCT 41.2 01/02/2021   MCV 87 01/02/2021   PLT 255 01/02/2021   Lab Results  Component Value Date   IRON 64 01/02/2021   TIBC 269 01/02/2021   FERRITIN 37 01/02/2021   Attestation Statements:   Reviewed by clinician on day of visit: allergies, medications, problem list, medical history, surgical history, family history, social history, and previous encounter notes.  I, Water quality scientist, CMA, am acting as transcriptionist for Briscoe Deutscher, DO  I have reviewed the above documentation for accuracy and completeness, and I agree with the above. Briscoe Deutscher, DO

## 2021-03-27 ENCOUNTER — Other Ambulatory Visit: Payer: Self-pay | Admitting: Family Medicine

## 2021-03-31 ENCOUNTER — Other Ambulatory Visit (INDEPENDENT_AMBULATORY_CARE_PROVIDER_SITE_OTHER): Payer: Self-pay | Admitting: Family Medicine

## 2021-03-31 DIAGNOSIS — J4540 Moderate persistent asthma, uncomplicated: Secondary | ICD-10-CM

## 2021-03-31 NOTE — Telephone Encounter (Signed)
Last OV with Dr Wallace 

## 2021-04-01 ENCOUNTER — Other Ambulatory Visit (INDEPENDENT_AMBULATORY_CARE_PROVIDER_SITE_OTHER): Payer: Self-pay | Admitting: Family Medicine

## 2021-04-01 DIAGNOSIS — F411 Generalized anxiety disorder: Secondary | ICD-10-CM

## 2021-04-02 NOTE — Telephone Encounter (Signed)
Pt last seen by Dr. Wallace.  

## 2021-04-10 ENCOUNTER — Encounter (INDEPENDENT_AMBULATORY_CARE_PROVIDER_SITE_OTHER): Payer: Self-pay | Admitting: Family Medicine

## 2021-04-10 ENCOUNTER — Other Ambulatory Visit: Payer: Self-pay

## 2021-04-10 ENCOUNTER — Other Ambulatory Visit (HOSPITAL_BASED_OUTPATIENT_CLINIC_OR_DEPARTMENT_OTHER): Payer: Self-pay

## 2021-04-10 ENCOUNTER — Ambulatory Visit (INDEPENDENT_AMBULATORY_CARE_PROVIDER_SITE_OTHER): Payer: BC Managed Care – PPO | Admitting: Family Medicine

## 2021-04-10 VITALS — BP 139/83 | HR 64 | Temp 97.4°F | Ht 68.0 in | Wt 272.0 lb

## 2021-04-10 DIAGNOSIS — R632 Polyphagia: Secondary | ICD-10-CM

## 2021-04-10 DIAGNOSIS — Z6841 Body Mass Index (BMI) 40.0 and over, adult: Secondary | ICD-10-CM

## 2021-04-10 DIAGNOSIS — J4540 Moderate persistent asthma, uncomplicated: Secondary | ICD-10-CM | POA: Diagnosis not present

## 2021-04-10 DIAGNOSIS — F411 Generalized anxiety disorder: Secondary | ICD-10-CM | POA: Diagnosis not present

## 2021-04-10 DIAGNOSIS — Z9189 Other specified personal risk factors, not elsewhere classified: Secondary | ICD-10-CM

## 2021-04-10 MED ORDER — PROPRANOLOL HCL 20 MG PO TABS
20.0000 mg | ORAL_TABLET | Freq: Three times a day (TID) | ORAL | 2 refills | Status: DC
Start: 2021-04-10 — End: 2022-02-02
  Filled 2021-04-10: qty 90, 30d supply, fill #0
  Filled 2021-07-03: qty 90, 30d supply, fill #1
  Filled 2021-08-13: qty 90, 30d supply, fill #2

## 2021-04-10 MED ORDER — MONTELUKAST SODIUM 10 MG PO TABS
ORAL_TABLET | ORAL | 0 refills | Status: DC
  Filled 2021-04-10: qty 30, 30d supply, fill #0

## 2021-04-10 MED ORDER — WEGOVY 1.7 MG/0.75ML ~~LOC~~ SOAJ
1.7000 mg | SUBCUTANEOUS | 0 refills | Status: DC
  Filled 2021-04-10 – 2021-04-11 (×2): qty 3, 30d supply, fill #0
  Filled 2021-04-14: qty 3, 28d supply, fill #0

## 2021-04-10 MED ORDER — ESCITALOPRAM OXALATE 20 MG PO TABS
20.0000 mg | ORAL_TABLET | Freq: Every day | ORAL | 0 refills | Status: DC
  Filled 2021-04-10: qty 30, 30d supply, fill #0

## 2021-04-11 ENCOUNTER — Encounter: Payer: BC Managed Care – PPO | Admitting: Family Medicine

## 2021-04-11 ENCOUNTER — Other Ambulatory Visit (HOSPITAL_BASED_OUTPATIENT_CLINIC_OR_DEPARTMENT_OTHER): Payer: Self-pay

## 2021-04-14 ENCOUNTER — Other Ambulatory Visit (HOSPITAL_BASED_OUTPATIENT_CLINIC_OR_DEPARTMENT_OTHER): Payer: Self-pay

## 2021-04-15 ENCOUNTER — Other Ambulatory Visit (HOSPITAL_BASED_OUTPATIENT_CLINIC_OR_DEPARTMENT_OTHER): Payer: Self-pay

## 2021-04-15 ENCOUNTER — Encounter (INDEPENDENT_AMBULATORY_CARE_PROVIDER_SITE_OTHER): Payer: Self-pay

## 2021-04-15 ENCOUNTER — Encounter (INDEPENDENT_AMBULATORY_CARE_PROVIDER_SITE_OTHER): Payer: Self-pay | Admitting: Family Medicine

## 2021-04-22 ENCOUNTER — Other Ambulatory Visit (HOSPITAL_BASED_OUTPATIENT_CLINIC_OR_DEPARTMENT_OTHER): Payer: Self-pay

## 2021-04-22 MED FILL — Amlodipine Besylate Tab 5 MG (Base Equivalent): ORAL | 90 days supply | Qty: 90 | Fill #0 | Status: AC

## 2021-04-23 ENCOUNTER — Other Ambulatory Visit (HOSPITAL_BASED_OUTPATIENT_CLINIC_OR_DEPARTMENT_OTHER): Payer: Self-pay

## 2021-04-23 MED ORDER — OZEMPIC (2 MG/DOSE) 8 MG/3ML ~~LOC~~ SOPN
2.0000 mg | PEN_INJECTOR | SUBCUTANEOUS | 0 refills | Status: DC
  Filled 2021-04-23: qty 3, 28d supply, fill #0

## 2021-04-23 NOTE — Progress Notes (Signed)
Chief Complaint:   OBESITY Alexa Anderson is here to discuss her progress with her obesity treatment plan along with follow-up of her obesity related diagnoses.   Today's visit was #: 20 Starting weight: 290 lbs Starting date: 03/21/2020 Today's weight: 272 lbs Today's date: 04/10/2021 Weight change since last visit: 2 lbs Total lbs lost to date: 18 lbs Body mass index is 41.36 kg/m.  Total weight loss percentage to date: -6.21%  Interim History:  Alexa Anderson says she is happy with her progress.  She says she has had some great boundary-setting opportunities. Denies polyphagia.  Current Meal Plan: keeping a food journal and adhering to recommended goals of 1800 calories and 100 grams of protein for 80-85% of the time.  Current Exercise Plan: Increased walking. Current Anti-Obesity Medications: Wegovy 1 mg subcutaneously weekly. Side effects: None.  Assessment/Plan:   Meds ordered this encounter  Medications   escitalopram (LEXAPRO) 20 MG tablet    Sig: Take 1 tablet (20 mg total) by mouth daily.    Dispense:  30 tablet    Refill:  0   montelukast (SINGULAIR) 10 MG tablet    Sig: TAKE 1 TABLET BY MOUTH EVERYDAY AT BEDTIME    Dispense:  30 tablet    Refill:  0   propranolol (INDERAL) 20 MG tablet    Sig: Take 1 tablet (20 mg total) by mouth 3 (three) times daily.    Dispense:  90 tablet    Refill:  2   DISCONTD: Semaglutide-Weight Management (WEGOVY) 1.7 MG/0.75ML SOAJ    Sig: Inject 1.7 mg into the skin once a week.    Dispense:  3 mL    Refill:  0    1. Polyphagia Controlled. Current treatment: Wegovy 1 mg subcutaneously weekly. Polyphagia refers to excessive feelings of hunger. She will continue to focus on protein-rich, low simple carbohydrate foods. We reviewed the importance of hydration, regular exercise for stress reduction, and restorative sleep.  2. Moderate persistent asthma without complication Alexa Anderson is taking Symbicort 2 puffs twice daily and has  albuterol for  rescue.  Plan:  Start Singulair 10 mg daily, as per below.  - Start montelukast (SINGULAIR) 10 MG tablet; TAKE 1 TABLET BY MOUTH EVERYDAY AT BEDTIME  Dispense: 30 tablet; Refill: 0  3. GAD (generalized anxiety disorder) Alexa Anderson is taking Lexapro 20 mg daily and propranolol 20 mg twice daily.  Plan:  Will refill medications at current doses today.  - Refill escitalopram (LEXAPRO) 20 MG tablet; Take 1 tablet (20 mg total) by mouth daily.  Dispense: 30 tablet; Refill: 0 - Refill propranolol (INDERAL) 20 MG tablet; Take 1 tablet (20 mg total) by mouth 3 (three) times daily.  Dispense: 90 tablet; Refill: 2  4. At risk for impaired metabolic function Starting goal: Lose 7-10% of starting weight. She will continue to focus on protein-rich, low simple carbohydrate foods. We reviewed the importance of hydration, regular exercise for stress reduction, and restorative sleep.  We will continue to check lab work every 3 months, with 10% weight loss, or should any other concerns arise.  5. Obesity, current BMI 41.4  Course: [name] is currently in the action stage of change. As such, her goal is to continue with weight loss efforts.   Nutrition goals: She has agreed to keeping a food journal and adhering to recommended goals of 1800 calories and 100 grams of protein.   Exercise goals:  As is.  Behavioral modification strategies: increasing lean protein intake, decreasing simple carbohydrates, increasing  vegetables, increasing water intake, decreasing liquid calories, and decreasing alcohol intake.  Alexa Anderson has agreed to follow-up with our clinic in 2 weeks. She was informed of the importance of frequent follow-up visits to maximize her success with intensive lifestyle modifications for her multiple health conditions.   Objective:   Blood pressure 139/83, pulse 64, temperature (!) 97.4 F (36.3 C), temperature source Oral, height 5\' 8"  (1.727 m), weight 272 lb (123.4 kg), SpO2 97 %. Body mass index is  41.36 kg/m.  General: Cooperative, alert, well developed, in no acute distress. HEENT: Conjunctivae and lids unremarkable. Cardiovascular: Regular rhythm.  Lungs: Normal work of breathing. Neurologic: No focal deficits.   Lab Results  Component Value Date   CREATININE 0.67 01/02/2021   BUN 14 01/02/2021   NA 140 01/02/2021   K 4.6 01/02/2021   CL 101 01/02/2021   CO2 22 01/02/2021   Lab Results  Component Value Date   ALT 24 01/02/2021   AST 25 01/02/2021   ALKPHOS 116 01/02/2021   BILITOT 0.5 01/02/2021   Lab Results  Component Value Date   HGBA1C 5.5 01/02/2021   HGBA1C 5.4 03/21/2020   HGBA1C 5.3 09/05/2013   Lab Results  Component Value Date   INSULIN 9.2 01/02/2021   INSULIN 8.9 03/21/2020   Lab Results  Component Value Date   TSH 2.420 01/02/2021   Lab Results  Component Value Date   CHOL 167 01/02/2021   HDL 60 01/02/2021   LDLCALC 97 01/02/2021   TRIG 46 01/02/2021   CHOLHDL 2.8 01/02/2021   Lab Results  Component Value Date   VD25OH 32.6 01/02/2021   VD25OH 39.8 03/21/2020   Lab Results  Component Value Date   WBC 6.7 01/02/2021   HGB 13.6 01/02/2021   HCT 41.2 01/02/2021   MCV 87 01/02/2021   PLT 255 01/02/2021   Lab Results  Component Value Date   IRON 64 01/02/2021   TIBC 269 01/02/2021   FERRITIN 37 01/02/2021   Attestation Statements:   Reviewed by clinician on day of visit: allergies, medications, problem list, medical history, surgical history, family history, social history, and previous encounter notes.  I, Water quality scientist, CMA, am acting as transcriptionist for Briscoe Deutscher, DO  I have reviewed the above documentation for accuracy and completeness, and I agree with the above. Briscoe Deutscher, DO

## 2021-04-28 ENCOUNTER — Encounter (INDEPENDENT_AMBULATORY_CARE_PROVIDER_SITE_OTHER): Payer: Self-pay

## 2021-05-06 ENCOUNTER — Other Ambulatory Visit (HOSPITAL_BASED_OUTPATIENT_CLINIC_OR_DEPARTMENT_OTHER): Payer: Self-pay

## 2021-05-06 ENCOUNTER — Telehealth (INDEPENDENT_AMBULATORY_CARE_PROVIDER_SITE_OTHER): Payer: Self-pay

## 2021-05-06 DIAGNOSIS — R7301 Impaired fasting glucose: Secondary | ICD-10-CM

## 2021-05-06 MED ORDER — TIRZEPATIDE 2.5 MG/0.5ML ~~LOC~~ SOAJ
2.5000 mg | SUBCUTANEOUS | 0 refills | Status: DC
  Filled 2021-05-06: qty 2, 28d supply, fill #0

## 2021-05-06 NOTE — Telephone Encounter (Signed)
RX for [facility] sent to pharmacy per Dr Juleen China

## 2021-05-07 ENCOUNTER — Ambulatory Visit (INDEPENDENT_AMBULATORY_CARE_PROVIDER_SITE_OTHER): Payer: BC Managed Care – PPO | Admitting: Family Medicine

## 2021-05-07 ENCOUNTER — Encounter (INDEPENDENT_AMBULATORY_CARE_PROVIDER_SITE_OTHER): Payer: Self-pay | Admitting: Family Medicine

## 2021-05-07 ENCOUNTER — Other Ambulatory Visit (HOSPITAL_BASED_OUTPATIENT_CLINIC_OR_DEPARTMENT_OTHER): Payer: Self-pay

## 2021-05-07 ENCOUNTER — Other Ambulatory Visit: Payer: Self-pay

## 2021-05-07 VITALS — BP 121/81 | HR 65 | Temp 98.2°F | Ht 68.0 in | Wt 275.0 lb

## 2021-05-07 DIAGNOSIS — Z9189 Other specified personal risk factors, not elsewhere classified: Secondary | ICD-10-CM

## 2021-05-07 DIAGNOSIS — Z6841 Body Mass Index (BMI) 40.0 and over, adult: Secondary | ICD-10-CM

## 2021-05-07 DIAGNOSIS — R7301 Impaired fasting glucose: Secondary | ICD-10-CM | POA: Diagnosis not present

## 2021-05-07 DIAGNOSIS — F411 Generalized anxiety disorder: Secondary | ICD-10-CM | POA: Diagnosis not present

## 2021-05-07 DIAGNOSIS — E8881 Metabolic syndrome: Secondary | ICD-10-CM

## 2021-05-07 DIAGNOSIS — E66813 Obesity, class 3: Secondary | ICD-10-CM

## 2021-05-07 DIAGNOSIS — R632 Polyphagia: Secondary | ICD-10-CM | POA: Diagnosis not present

## 2021-05-07 MED ORDER — MOUNJARO 5 MG/0.5ML ~~LOC~~ SOAJ
5.0000 mg | SUBCUTANEOUS | 0 refills | Status: AC
  Filled 2021-05-07 (×2): qty 2, 28d supply, fill #0

## 2021-05-07 NOTE — Progress Notes (Signed)
Chief Complaint:   OBESITY Alexa Anderson is here to discuss her progress with her obesity treatment plan along with follow-up of her obesity related diagnoses. See Medical Weight Management Flowsheet for complete bioelectrical impedance results.  Today's visit was #: 21 Starting weight: 290 lbs Starting date: 03/21/2020 Today's weight: 275 lbs Today's date: 05/07/2021 Weight change since last visit: +3 lbs Total lbs lost to date: 15 lbs Body mass index is 41.81 kg/m.  Total weight loss percentage to date: -5.17%  Interim History: Alexa Anderson's last Ozempic dose was on 04/07/2021.  She says she is frustrated by dynamics at work and insurance coverage changes. Nutrition Plan: keeping a food journal and adhering to recommended goals of 1800 calories and 100 grams of protein for 75-80% of the time. Activity:  Walking for 30 minutes 4 times per week.  Assessment/Plan:   1. Metabolic syndrome Starting goal: Lose 7-10% of starting weight. She will continue to focus on protein-rich, low simple carbohydrate foods. We reviewed the importance of hydration, regular exercise for stress reduction, and restorative sleep.  We will continue to check lab work every 3 months, with 10% weight loss, or should any other concerns arise.  Plan:  Start Mounjaro 5 mg subcutaneously weekly.  - Start tirzepatide [facility]) 5 MG/0.5ML Pen; Inject 5 mg into the skin once a week.  Dispense: 2 mL; Refill: 0  2. Impaired fasting glucose After discussion, patient would like to start below medication. Expectations, risks, and potential side effects reviewed.   - Start tirzepatide [facility]) 5 MG/0.5ML Pen; Inject 5 mg into the skin once a week.  Dispense: 2 mL; Refill: 0  3. Polyphagia Current treatment: Ozempic 2 mg subcutaneously weekly.  Last injection on 04/25/2021. She will continue to focus on protein-rich, low simple carbohydrate foods. We reviewed the importance of hydration, regular exercise for stress reduction, and  restorative sleep.  Plan:  Start Mounjaro 5 mg subcutaneously weekly.  4. GAD (generalized anxiety disorder) Alexa Anderson is taking Lexapro 20 mg daily and propranolol 20 mg twice daily.  Plan:  Continue Lexapro and propranolol at current doses.  5. At risk for heart disease Due to Alexa Anderson's current state of health and medical condition(s), she is at a higher risk for heart disease.  This puts the patient at much greater risk to subsequently develop cardiopulmonary conditions that can significantly affect patient's quality of life in a negative manner.    At least 8 minutes were spent on counseling Alexa Anderson about these concerns today. Evidence-based interventions for health behavior change were utilized today including the discussion of self monitoring techniques, problem-solving barriers, and SMART goal setting techniques.  Specifically, regarding patient's less desirable eating habits and patterns, we employed the technique of small changes when Alexa Anderson has not been able to fully commit to her prudent nutritional plan.  6. Obesity, current BMI 41.9  Course: Alexa Anderson is currently in the action stage of change. As such, her goal is to continue with weight loss efforts.   Nutrition goals: She has agreed to keeping a food journal and adhering to recommended goals of 1800 calories and 100 grams of protein.   Exercise goals:  As is.  Behavioral modification strategies: emotional eating strategies.  Alexa Anderson has agreed to follow-up with our clinic in 2-3 weeks. She was informed of the importance of frequent follow-up visits to maximize her success with intensive lifestyle modifications for her multiple health conditions.   Objective:   Blood pressure 121/81, pulse 65, temperature 98.2 F (36.8 C), temperature  source Oral, height '5\' 8"'$  (1.727 m), weight 275 lb (124.7 kg), SpO2 98 %. Body mass index is 41.81 kg/m.  General: Cooperative, alert, well developed, in no acute distress. HEENT: Conjunctivae  and lids unremarkable. Cardiovascular: Regular rhythm.  Lungs: Normal work of breathing. Neurologic: No focal deficits.   Lab Results  Component Value Date   CREATININE 0.67 01/02/2021   BUN 14 01/02/2021   NA 140 01/02/2021   K 4.6 01/02/2021   CL 101 01/02/2021   CO2 22 01/02/2021   Lab Results  Component Value Date   ALT 24 01/02/2021   AST 25 01/02/2021   ALKPHOS 116 01/02/2021   BILITOT 0.5 01/02/2021   Lab Results  Component Value Date   HGBA1C 5.5 01/02/2021   HGBA1C 5.4 03/21/2020   HGBA1C 5.3 09/05/2013   Lab Results  Component Value Date   INSULIN 9.2 01/02/2021   INSULIN 8.9 03/21/2020   Lab Results  Component Value Date   TSH 2.420 01/02/2021   Lab Results  Component Value Date   CHOL 167 01/02/2021   HDL 60 01/02/2021   LDLCALC 97 01/02/2021   TRIG 46 01/02/2021   CHOLHDL 2.8 01/02/2021   Lab Results  Component Value Date   VD25OH 32.6 01/02/2021   VD25OH 39.8 03/21/2020   Lab Results  Component Value Date   WBC 6.7 01/02/2021   HGB 13.6 01/02/2021   HCT 41.2 01/02/2021   MCV 87 01/02/2021   PLT 255 01/02/2021   Lab Results  Component Value Date   IRON 64 01/02/2021   TIBC 269 01/02/2021   FERRITIN 37 01/02/2021   Attestation Statements:   Reviewed by clinician on day of visit: allergies, medications, problem list, medical history, surgical history, family history, social history, and previous encounter notes.  I, Water quality scientist, CMA, am acting as transcriptionist for Briscoe Deutscher, DO  I have reviewed the above documentation for accuracy and completeness, and I agree with the above. Briscoe Deutscher, DO

## 2021-05-09 ENCOUNTER — Ambulatory Visit (INDEPENDENT_AMBULATORY_CARE_PROVIDER_SITE_OTHER): Payer: BC Managed Care – PPO | Admitting: Family Medicine

## 2021-05-09 ENCOUNTER — Other Ambulatory Visit: Payer: Self-pay

## 2021-05-09 ENCOUNTER — Other Ambulatory Visit (HOSPITAL_BASED_OUTPATIENT_CLINIC_OR_DEPARTMENT_OTHER): Payer: Self-pay

## 2021-05-09 ENCOUNTER — Encounter: Payer: Self-pay | Admitting: Family Medicine

## 2021-05-09 VITALS — BP 132/84 | HR 75 | Temp 97.7°F | Ht 68.0 in | Wt 279.0 lb

## 2021-05-09 DIAGNOSIS — Z0000 Encounter for general adult medical examination without abnormal findings: Secondary | ICD-10-CM

## 2021-05-09 DIAGNOSIS — E049 Nontoxic goiter, unspecified: Secondary | ICD-10-CM | POA: Diagnosis not present

## 2021-05-09 DIAGNOSIS — R7309 Other abnormal glucose: Secondary | ICD-10-CM | POA: Diagnosis not present

## 2021-05-09 DIAGNOSIS — Z1211 Encounter for screening for malignant neoplasm of colon: Secondary | ICD-10-CM

## 2021-05-09 DIAGNOSIS — I10 Essential (primary) hypertension: Secondary | ICD-10-CM | POA: Diagnosis not present

## 2021-05-09 LAB — LIPID PANEL
Cholesterol: 156 mg/dL (ref 0–200)
HDL: 49.7 mg/dL (ref >39.00)
LDL Cholesterol: 96 mg/dL (ref 0–99)
NonHDL: 106.57
Total CHOL/HDL Ratio: 3
Triglycerides: 54 mg/dL (ref 0.0–149.0)
VLDL: 10.8 mg/dL (ref 0.0–40.0)

## 2021-05-09 LAB — COMPREHENSIVE METABOLIC PANEL
ALT: 35 U/L (ref 0–35)
AST: 25 U/L (ref 0–37)
Albumin: 4 g/dL (ref 3.5–5.2)
Alkaline Phosphatase: 104 U/L (ref 39–117)
BUN: 12 mg/dL (ref 6–23)
CO2: 31 mEq/L (ref 19–32)
Calcium: 9.1 mg/dL (ref 8.4–10.5)
Chloride: 102 mEq/L (ref 96–112)
Creatinine, Ser: 0.6 mg/dL (ref 0.40–1.20)
GFR: 104.83 mL/min (ref >60.00)
Glucose, Bld: 87 mg/dL (ref 70–99)
Potassium: 4.2 mEq/L (ref 3.5–5.1)
Sodium: 137 mEq/L (ref 135–145)
Total Bilirubin: 0.6 mg/dL (ref 0.2–1.2)
Total Protein: 6.9 g/dL (ref 6.0–8.3)

## 2021-05-09 LAB — CBC WITH DIFFERENTIAL/PLATELET
Basophils Absolute: 0 10*3/uL (ref 0.0–0.1)
Basophils Relative: 0.4 % (ref 0.0–3.0)
Eosinophils Absolute: 0.1 10*3/uL (ref 0.0–0.7)
Eosinophils Relative: 1.6 % (ref 0.0–5.0)
HCT: 39 % (ref 36.0–46.0)
Hemoglobin: 13.1 g/dL (ref 12.0–15.0)
Lymphocytes Relative: 24.8 % (ref 12.0–46.0)
Lymphs Abs: 1.8 10*3/uL (ref 0.7–4.0)
MCHC: 33.6 g/dL (ref 30.0–36.0)
MCV: 86.8 fl (ref 78.0–100.0)
Monocytes Absolute: 0.5 10*3/uL (ref 0.1–1.0)
Monocytes Relative: 7.3 % (ref 3.0–12.0)
Neutro Abs: 4.9 10*3/uL (ref 1.4–7.7)
Neutrophils Relative %: 65.9 % (ref 43.0–77.0)
Platelets: 273 10*3/uL (ref 150.0–400.0)
RBC: 4.49 Mil/uL (ref 3.87–5.11)
RDW: 14.6 % (ref 11.5–15.5)
WBC: 7.5 10*3/uL (ref 4.0–10.5)

## 2021-05-09 LAB — TSH: TSH: 1.85 u[IU]/mL (ref 0.35–5.50)

## 2021-05-09 LAB — HEMOGLOBIN A1C: Hgb A1c MFr Bld: 5.1 % (ref 4.6–6.5)

## 2021-05-09 MED ORDER — AMLODIPINE BESYLATE 5 MG PO TABS
5.0000 mg | ORAL_TABLET | Freq: Every day | ORAL | 3 refills | Status: DC
  Filled 2021-05-09 – 2021-07-03 (×2): qty 90, 90d supply, fill #0
  Filled 2021-10-22: qty 30, 30d supply, fill #1
  Filled 2021-11-17: qty 30, 30d supply, fill #2
  Filled 2021-12-22: qty 30, 30d supply, fill #3

## 2021-05-09 NOTE — Patient Instructions (Addendum)
Do the ifob kit for colon cancer screening   Schedule your mammogram when you are ready   Labs today   Keep working on weight loss and better habits

## 2021-05-09 NOTE — Progress Notes (Signed)
Subjective:    Patient ID: Alexa Anderson, female    DOB: 11-Mar-1971, 50 y.o.   MRN: [id number]  This visit occurred during the SARS-CoV-2 public health emergency.  Safety protocols were in place, including screening questions prior to the visit, additional usage of staff PPE, and extensive cleaning of exam room while observing appropriate contact time as indicated for disinfecting solutions.   HPI Here for health maintenance exam and to review chronic medical problems    Wt Readings from Last 3 Encounters:  05/09/21 279 lb (126.6 kg)  05/07/21 275 lb (124.7 kg)  04/10/21 272 lb (123.4 kg)   42.42 kg/m  Switched jobs- great thing  Feeling good   Going to the [facility] center - (a break w/o ins) Back now  Generic mounjaro-to start soon  (ins denied others)-was frustrated  Eats emotionally   Seeing the clinic every 2 weeks helps  Accountability helps    Colon cancer screening  Has not done colonoscopy yet    Mammogram 9/17 Has not scheduled -is afraid  Self breast exam -no lumps   Pap 5/21-negative Menses are getting more regular and less heavy     Covid immunized Flu shot-declines Tdap 12/14 Zoster status -not interested in shingrix  HTN bp is stable today  No cp or palpitations or headaches or edema  No side effects to medicines  BP Readings from Last 3 Encounters:  05/09/21 132/84  05/07/21 121/81  04/10/21 139/83     Propanolol 20 mg tid Amlodipine 5 mg daily   Pulse Readings from Last 3 Encounters:  05/09/21 75  05/07/21 65  04/10/21 64     Lab Results  Component Value Date   CREATININE 0.67 01/02/2021   BUN 14 01/02/2021   NA 140 01/02/2021   K 4.6 01/02/2021   CL 101 01/02/2021   CO2 22 01/02/2021      H/o goiter in the past Lab Results  Component Value Date   TSH 2.420 01/02/2021    Elevated glucose level Lab Results  Component Value Date   HGBA1C 5.5 01/02/2021  Expect that will look better   Patient Active  Problem List   Diagnosis Date Noted   Colon cancer screening 05/09/2021   Elevated glucose level 03/12/2020   Enlarged uterus 02/19/2020   Enlarged ovary 02/19/2020   Leg heaviness 04/05/2019   Amenorrhea 12/12/2018   Tinnitus 10/24/2018   Encounter for screening mammogram for breast cancer 05/12/2016   Routine general medical examination at a health care facility 09/05/2013   Encounter for routine gynecological examination 09/05/2013   Stress reaction 09/05/2013   Screen for STD (sexually transmitted disease) 03/23/2012   Goiter 06/05/2008   Morbid obesity ([place]) 06/04/2008   H/O: depression 06/04/2008   Migraine without aura 06/04/2008   Essential hypertension 06/04/2008   Past Medical History:  Diagnosis Date   Anxiety    Depression    Goiter    History of depression    Hypertension    Migraines    Obesity    Weight loss    use to wiegh over 400 lbs   Past Surgical History:  Procedure Laterality Date   CESAREAN SECTION     thyroid ultrasound  02/12   stable 5 mm nodule hypoechoic on left   WISDOM TOOTH EXTRACTION     Social History   Tobacco Use   Smoking status: Never   Smokeless tobacco: Never  Vaping Use   Vaping Use: Never used  Substance  Use Topics   Alcohol use: Yes    Comment: Occasional   Drug use: No   Family History  Problem Relation Age of Onset   Obesity Mother    Cancer Father        renal cell   Parkinsonism Father    Allergies  Allergen Reactions   Bee Venom Anaphylaxis   Current Outpatient Medications on File Prior to Visit  Medication Sig Dispense Refill   albuterol (VENTOLIN HFA) 108 (90 Base) MCG/ACT inhaler Inhale 1-2 puffs into the lungs every 6 (six) hours as needed for wheezing or shortness of breath. 16 g 0   budesonide-formoterol (SYMBICORT) 160-4.5 MCG/ACT inhaler Inhale 2 puffs into the lungs 2 (two) times daily. 1 each 3   escitalopram (LEXAPRO) 20 MG tablet Take 1 tablet (20 mg total) by mouth daily. 30 tablet 0    montelukast (SINGULAIR) 10 MG tablet TAKE 1 TABLET BY MOUTH EVERYDAY AT BEDTIME 30 tablet 0   ondansetron (ZOFRAN ODT) 4 MG disintegrating tablet Take 1 tablet (4 mg total) by mouth every 8 (eight) hours as needed for nausea or vomiting. 20 tablet 0   propranolol (INDERAL) 20 MG tablet Take 1 tablet (20 mg total) by mouth 3 (three) times daily. 90 tablet 2   tirzepatide (MOUNJARO) 5 MG/0.5ML Pen Inject 5 mg into the skin once a week. 2 mL 0   traZODone (DESYREL) 50 MG tablet Take 1 tablet (50 mg total) by mouth at bedtime as needed for sleep. 30 tablet 0   No current facility-administered medications on file prior to visit.    Review of Systems  Constitutional:  Positive for fatigue. Negative for activity change, appetite change, fever and unexpected weight change.  HENT:  Negative for congestion, ear pain, rhinorrhea, sinus pressure and sore throat.   Eyes:  Negative for pain, redness and visual disturbance.  Respiratory:  Negative for cough, shortness of breath and wheezing.   Cardiovascular:  Negative for chest pain and palpitations.  Gastrointestinal:  Negative for abdominal pain, blood in stool, constipation and diarrhea.  Endocrine: Negative for polydipsia and polyuria.  Genitourinary:  Negative for dysuria, frequency and urgency.  Musculoskeletal:  Negative for arthralgias, back pain and myalgias.  Skin:  Negative for pallor and rash.  Allergic/Immunologic: Negative for environmental allergies.  Neurological:  Negative for dizziness, syncope and headaches.  Hematological:  Negative for adenopathy. Does not bruise/bleed easily.  Psychiatric/Behavioral:  Negative for decreased concentration and dysphoric mood. The patient is not nervous/anxious.       Objective:   Physical Exam Constitutional:      General: She is not in acute distress.    Appearance: Normal appearance. She is well-developed. She is obese. She is not ill-appearing or diaphoretic.  HENT:     Head: Normocephalic and  atraumatic.     Right Ear: Tympanic membrane, ear canal and external ear normal.     Left Ear: Tympanic membrane, ear canal and external ear normal.     Nose: Nose normal. No congestion.     Mouth/Throat:     Mouth: Mucous membranes are moist.     Pharynx: Oropharynx is clear. No posterior oropharyngeal erythema.  Eyes:     General: No scleral icterus.    Extraocular Movements: Extraocular movements intact.     Conjunctiva/sclera: Conjunctivae normal.     Pupils: Pupils are equal, round, and reactive to light.  Neck:     Thyroid: No thyromegaly.     Vascular: No carotid bruit or JVD.  Cardiovascular:     Rate and Rhythm: Normal rate and regular rhythm.     Pulses: Normal pulses.     Heart sounds: Normal heart sounds.    No gallop.  Pulmonary:     Effort: Pulmonary effort is normal. No respiratory distress.     Breath sounds: Normal breath sounds. No wheezing.     Comments: Good air exch Chest:     Chest wall: No tenderness.  Abdominal:     General: Bowel sounds are normal. There is no distension or abdominal bruit.     Palpations: Abdomen is soft. There is no mass.     Tenderness: There is no abdominal tenderness.     Hernia: No hernia is present.  Genitourinary:    Comments: Breast exam: No mass, nodules, thickening, tenderness, bulging, retraction, inflamation, nipple discharge or skin changes noted.  No axillary or clavicular LA.     Musculoskeletal:        General: No tenderness. Normal range of motion.     Cervical back: Normal range of motion and neck supple. No rigidity. No muscular tenderness.     Right lower leg: No edema.     Left lower leg: No edema.  Lymphadenopathy:     Cervical: No cervical adenopathy.  Skin:    General: Skin is warm and dry.     Coloration: Skin is not pale.     Findings: No erythema or rash.     Comments: Solar lentigines diffusely   Neurological:     Mental Status: She is alert. Mental status is at baseline.     Cranial Nerves: No  cranial nerve deficit.     Motor: No abnormal muscle tone.     Coordination: Coordination normal.     Gait: Gait normal.     Deep Tendon Reflexes: Reflexes are normal and symmetric. Reflexes normal.  Psychiatric:        Mood and Affect: Mood normal.        Cognition and Memory: Cognition and memory normal.          Assessment & Plan:   Problem List Items Addressed This Visit       Cardiovascular and Mediastinum   Essential hypertension    bp in fair control at this time  BP Readings from Last 1 Encounters:  05/09/21 132/84  No changes needed Most recent labs reviewed  Disc lifstyle change with low sodium diet and exercise  Plan to continue propranolol 20 mg tid and amlodipine 5 mg daily       Relevant Medications   amLODipine (NORVASC) 5 MG tablet   Other Relevant Orders   CBC with Differential/Platelet (Completed)   Comprehensive metabolic panel (Completed)   TSH (Completed)   Lipid panel (Completed)     Endocrine   Goiter    No clinical change  No change in exam  Lab Results  Component Value Date   TSH 1.85 05/09/2021         Relevant Orders   TSH (Completed)     Other   Morbid obesity ([place])    Discussed how this problem influences overall health and the risks it imposes  Reviewed plan for weight loss with lower calorie diet (via better food choices and also portion control or program like weight watchers) and exercise building up to or more than 30 minutes 5 days per week including some aerobic activity   Commended on wt loss with the healthy weight and wellness program  Working on  emotional eating        Routine general medical examination at a health care facility - Primary    Reviewed health habits including diet and exercise and skin cancer prevention Reviewed appropriate screening tests for age  Also reviewed health mt list, fam hx and immunization status , as well as social and family history   Labs ordered commened on wt loss  ifob kit  ordered  Given info to schedule mammogram  Pap utd  Declines flu and shingrix vaccines Enc good self care        Elevated glucose level    A1c today  Has lost wt with diet/exercise in the healthy weight program  Expect stable to improved        Relevant Orders   Hemoglobin A1c (Completed)   Colon cancer screening    Reviewed options for screening  ifob kit ordered  Pt wants to wait for a better time to do a colonoscopy        Relevant Orders   Fecal occult blood, imunochemical

## 2021-05-11 NOTE — Assessment & Plan Note (Signed)
Discussed how this problem influences overall health and the risks it imposes  Reviewed plan for weight loss with lower calorie diet (via better food choices and also portion control or program like weight watchers) and exercise building up to or more than 30 minutes 5 days per week including some aerobic activity   Commended on wt loss with the healthy weight and wellness program  Working on emotional eating

## 2021-05-11 NOTE — Assessment & Plan Note (Signed)
No clinical change  No change in exam  Lab Results  Component Value Date   TSH 1.85 05/09/2021

## 2021-05-11 NOTE — Assessment & Plan Note (Signed)
Reviewed health habits including diet and exercise and skin cancer prevention Reviewed appropriate screening tests for age  Also reviewed health mt list, fam hx and immunization status , as well as social and family history   Labs ordered commened on wt loss  ifob kit ordered  Given info to schedule mammogram  Pap utd  Declines flu and shingrix vaccines Enc good self care

## 2021-05-11 NOTE — Assessment & Plan Note (Signed)
Reviewed options for screening  ifob kit ordered  Pt wants to wait for a better time to do a colonoscopy

## 2021-05-11 NOTE — Assessment & Plan Note (Signed)
bp in fair control at this time  BP Readings from Last 1 Encounters:  05/09/21 132/84   No changes needed Most recent labs reviewed  Disc lifstyle change with low sodium diet and exercise  Plan to continue propranolol 20 mg tid and amlodipine 5 mg daily

## 2021-05-11 NOTE — Assessment & Plan Note (Signed)
A1c today  Has lost wt with diet/exercise in the healthy weight program  Expect stable to improved

## 2021-05-21 ENCOUNTER — Ambulatory Visit (INDEPENDENT_AMBULATORY_CARE_PROVIDER_SITE_OTHER): Payer: BC Managed Care – PPO | Admitting: Family Medicine

## 2021-06-02 ENCOUNTER — Other Ambulatory Visit (HOSPITAL_BASED_OUTPATIENT_CLINIC_OR_DEPARTMENT_OTHER): Payer: Self-pay

## 2021-06-04 ENCOUNTER — Other Ambulatory Visit (HOSPITAL_BASED_OUTPATIENT_CLINIC_OR_DEPARTMENT_OTHER): Payer: Self-pay

## 2021-06-04 ENCOUNTER — Other Ambulatory Visit: Payer: Self-pay

## 2021-06-04 ENCOUNTER — Encounter (INDEPENDENT_AMBULATORY_CARE_PROVIDER_SITE_OTHER): Payer: Self-pay | Admitting: Family Medicine

## 2021-06-04 ENCOUNTER — Ambulatory Visit (INDEPENDENT_AMBULATORY_CARE_PROVIDER_SITE_OTHER): Payer: BC Managed Care – PPO | Admitting: Family Medicine

## 2021-06-04 VITALS — BP 130/85 | HR 64 | Temp 98.2°F | Ht 68.0 in | Wt 276.0 lb

## 2021-06-04 DIAGNOSIS — J302 Other seasonal allergic rhinitis: Secondary | ICD-10-CM

## 2021-06-04 DIAGNOSIS — Z9189 Other specified personal risk factors, not elsewhere classified: Secondary | ICD-10-CM | POA: Diagnosis not present

## 2021-06-04 DIAGNOSIS — Z6841 Body Mass Index (BMI) 40.0 and over, adult: Secondary | ICD-10-CM

## 2021-06-04 DIAGNOSIS — E8881 Metabolic syndrome: Secondary | ICD-10-CM

## 2021-06-04 DIAGNOSIS — E66813 Obesity, class 3: Secondary | ICD-10-CM

## 2021-06-04 DIAGNOSIS — F418 Other specified anxiety disorders: Secondary | ICD-10-CM | POA: Diagnosis not present

## 2021-06-04 MED ORDER — MONTELUKAST SODIUM 10 MG PO TABS
ORAL_TABLET | ORAL | 3 refills | Status: DC
  Filled 2021-06-04: qty 90, 90d supply, fill #0
  Filled 2021-09-26: qty 30, 30d supply, fill #1
  Filled 2021-11-10: qty 30, 30d supply, fill #2

## 2021-06-04 NOTE — Progress Notes (Signed)
Chief Complaint:   OBESITY Alexa Anderson is here to discuss her progress with her obesity treatment plan along with follow-up of her obesity related diagnoses.   Today's visit was #: 22 Starting weight: 290 lbs Starting date: 03/21/2020 Today's weight: 276 lbs Today's date: 06/04/2021 Weight change since last visit: +1 lb Total lbs lost to date: 14 lbs Body mass index is 41.97 kg/m.  Total weight loss percentage to date: -4.83%  Current Meal Plan: keeping a food journal and adhering to recommended goals of 1800 calories and 100 grams of protein for 50-60% of the time.  Current Exercise Plan: Increased walking. Current Anti-Obesity Medications: Mounjaro 5 mg subcutaneously weekly. Side effects: None.  Interim History:  Alexa Anderson has not started [facility] yet.  She says she has had increased stress at work and has the urge to stress eat, but is managing.  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  Assessment/Plan:   1. Seasonal allergies Improving. Alexa Anderson is taking Singulair, albuterol, and Symbicort for allergies.  - Refill montelukast (SINGULAIR) 10 MG tablet; TAKE 1 TABLET BY MOUTH EVERYDAY AT BEDTIME  Dispense: 90 tablet; Refill: 3  2. Metabolic syndrome Starting goal: Lose 7-10% of starting weight. She will continue to focus on protein-rich, low simple carbohydrate foods. We reviewed the importance of hydration, regular exercise for stress reduction, and restorative sleep.  We will continue to check lab work every 3 months, with 10% weight loss, or should any other concerns arise.  3. Situational anxiety, with emotional eating tendencies Alexa Anderson takes propranolol 20 mg three times daily as needed for anxiety.  She is also taking Lexapro 20 mg daily.   Behavior modification techniques were discussed today to help Alexa Anderson deal with her anxiety  4. At risk for heart disease Due to Alexa Anderson's current state of health and medical condition(s), she  is at a higher risk for heart disease.  This puts the patient at much greater risk to subsequently develop cardiopulmonary conditions that can significantly affect patient's quality of life in a negative manner.    At least 8 minutes were spent on counseling Alexa Anderson about these concerns today. Evidence-based interventions for health behavior change were utilized today including the discussion of self monitoring techniques, problem-solving barriers, and SMART goal setting techniques.  Specifically, regarding patient's less desirable eating habits and patterns, we employed the technique of small changes when Alexa Anderson has not been able to fully commit to her prudent nutritional plan.  5. Obesity, current BMI 42  Course: Alexa Anderson is currently in the action stage of change. As such, her goal is to continue with weight loss efforts.   Nutrition goals: She has agreed to keeping a food journal and adhering to recommended goals of 1800 calories and 100 grams of protein.   Exercise goals:  As is.  Behavioral modification strategies: increasing lean protein intake, decreasing simple carbohydrates, increasing vegetables, and increasing water intake.  Alexa Anderson has agreed to follow-up with our clinic in 2 weeks. She was informed of the importance of frequent follow-up visits to maximize her success with intensive lifestyle modifications for her multiple health conditions.   Objective:   Blood pressure 130/85, pulse 64, temperature 98.2 F (36.8 C), temperature source Oral, height '5\' 8"'$  (1.727 m), weight 276 lb (125.2 kg), SpO2 96 %. Body mass index is 41.97 kg/m.  General: Cooperative, alert, well developed, in no acute distress. HEENT: Conjunctivae and lids unremarkable. Cardiovascular: Regular rhythm.  Lungs: Normal  work of breathing. Neurologic: No focal deficits.   Lab Results  Component Value Date   CREATININE 0.60 05/09/2021   BUN 12 05/09/2021   NA 137 05/09/2021   K 4.2 05/09/2021   CL 102  05/09/2021   CO2 31 05/09/2021   Lab Results  Component Value Date   ALT 35 05/09/2021   AST 25 05/09/2021   ALKPHOS 104 05/09/2021   BILITOT 0.6 05/09/2021   Lab Results  Component Value Date   HGBA1C 5.1 05/09/2021   HGBA1C 5.5 01/02/2021   HGBA1C 5.4 03/21/2020   HGBA1C 5.3 09/05/2013   Lab Results  Component Value Date   INSULIN 9.2 01/02/2021   INSULIN 8.9 03/21/2020   Lab Results  Component Value Date   TSH 1.85 05/09/2021   Lab Results  Component Value Date   CHOL 156 05/09/2021   HDL 49.70 05/09/2021   LDLCALC 96 05/09/2021   TRIG 54.0 05/09/2021   CHOLHDL 3 05/09/2021   Lab Results  Component Value Date   VD25OH 32.6 01/02/2021   VD25OH 39.8 03/21/2020   Lab Results  Component Value Date   WBC 7.5 05/09/2021   HGB 13.1 05/09/2021   HCT 39.0 05/09/2021   MCV 86.8 05/09/2021   PLT 273.0 05/09/2021   Lab Results  Component Value Date   IRON 64 01/02/2021   TIBC 269 01/02/2021   FERRITIN 37 01/02/2021   Attestation Statements:   Reviewed by clinician on day of visit: allergies, medications, problem list, medical history, surgical history, family history, social history, and previous encounter notes.  I, Water quality scientist, CMA, am acting as transcriptionist for Briscoe Deutscher, DO  I have reviewed the above documentation for accuracy and completeness, and I agree with the above. Briscoe Deutscher, DO

## 2021-06-20 ENCOUNTER — Other Ambulatory Visit: Payer: Self-pay

## 2021-06-20 ENCOUNTER — Ambulatory Visit (INDEPENDENT_AMBULATORY_CARE_PROVIDER_SITE_OTHER): Payer: BC Managed Care – PPO | Admitting: Family Medicine

## 2021-06-20 ENCOUNTER — Encounter (INDEPENDENT_AMBULATORY_CARE_PROVIDER_SITE_OTHER): Payer: Self-pay | Admitting: Family Medicine

## 2021-06-20 ENCOUNTER — Other Ambulatory Visit (HOSPITAL_BASED_OUTPATIENT_CLINIC_OR_DEPARTMENT_OTHER): Payer: Self-pay

## 2021-06-20 VITALS — BP 123/78 | HR 65 | Temp 98.1°F | Ht 68.0 in | Wt 274.0 lb

## 2021-06-20 DIAGNOSIS — E8881 Metabolic syndrome: Secondary | ICD-10-CM

## 2021-06-20 DIAGNOSIS — Z6841 Body Mass Index (BMI) 40.0 and over, adult: Secondary | ICD-10-CM

## 2021-06-20 DIAGNOSIS — Z9189 Other specified personal risk factors, not elsewhere classified: Secondary | ICD-10-CM

## 2021-06-20 DIAGNOSIS — F411 Generalized anxiety disorder: Secondary | ICD-10-CM | POA: Diagnosis not present

## 2021-06-20 DIAGNOSIS — R7301 Impaired fasting glucose: Secondary | ICD-10-CM | POA: Diagnosis not present

## 2021-06-20 MED ORDER — TIRZEPATIDE 5 MG/0.5ML ~~LOC~~ SOAJ
5.0000 mg | SUBCUTANEOUS | 0 refills | Status: DC
  Filled 2021-06-20: qty 6, 84d supply, fill #0
  Filled 2021-07-03: qty 2, 28d supply, fill #0

## 2021-06-20 MED ORDER — ESCITALOPRAM OXALATE 20 MG PO TABS
20.0000 mg | ORAL_TABLET | Freq: Every day | ORAL | 0 refills | Status: DC
  Filled 2021-06-20 – 2021-07-03 (×2): qty 90, 90d supply, fill #0

## 2021-06-23 NOTE — Progress Notes (Signed)
Chief Complaint:   OBESITY Alexa Anderson is here to discuss her progress with her obesity treatment plan along with follow-up of her obesity related diagnoses.   Today's visit was #: 23 Starting weight: 290 lbs Starting date: 03/21/2020 Today's weight: 274 lbs Today's date: 06/20/2021 Weight change since last visit: 2 lbs Total lbs lost to date: 16 lbs Body mass index is 41.66 kg/m.  Total weight loss percentage to date: -5.52%  Current Meal Plan: keeping a food journal and adhering to recommended goals of 1800 calories and 100 grams of protein for 70-80% of the time.  Current Exercise Plan: Increased walking.  Interim History:  Alexa Anderson has not taken Mounjaro 5 mg yet.  She says she has accepted a new job - will not have insurance during the month of October.  Assessment/Plan:   1. Metabolic syndrome Starting goal: Lose 7-10% of starting weight. She will continue to focus on protein-rich, low simple carbohydrate foods. We reviewed the importance of hydration, regular exercise for stress reduction, and restorative sleep.  We will continue to check lab work every 3 months, with 10% weight loss, or should any other concerns arise.  - Start tirzepatide [facility]) 5 MG/0.5ML Pen; Inject 5 mg into the skin once a week.  Dispense: 6 mL; Refill: 0  2. Impaired fasting glucose, with polyphagia Uncontrolled.  Current treatment: None. She will continue to focus on protein-rich, low simple carbohydrate foods. We reviewed the importance of hydration, regular exercise for stress reduction, and restorative sleep.  Plan:  Start taking Mounjaro 5 mg subcutaneously weekly.  - Start tirzepatide [facility]) 5 MG/0.5ML Pen; Inject 5 mg into the skin once a week.  Dispense: 6 mL; Refill: 0  3. GAD (generalized anxiety disorder) Alexa Anderson is taking Lexapro 20 mg daily and propranolol 20 mg three times daily for anxiety.  - Refill escitalopram (LEXAPRO) 20 MG tablet; Take 1 tablet (20 mg total) by mouth  daily.  Dispense: 90 tablet; Refill: 0  4. At risk for heart disease Due to Alexa Anderson's current state of health and medical condition(s), she is at a higher risk for heart disease.  This puts the patient at much greater risk to subsequently develop cardiopulmonary conditions that can significantly affect patient's quality of life in a negative manner.    At least 8 minutes were spent on counseling Alexa Anderson about these concerns today. Evidence-based interventions for health behavior change were utilized today including the discussion of self monitoring techniques, problem-solving barriers, and SMART goal setting techniques.  Specifically, regarding patient's less desirable eating habits and patterns, we employed the technique of small changes when Alexa Anderson has not been able to fully commit to her prudent nutritional plan.  5. Obesity, current BMI 41.7  Course: Alexa Anderson is currently in the action stage of change. As such, her goal is to continue with weight loss efforts.   Nutrition goals: She has agreed to keeping a food journal and adhering to recommended goals of 1800 calories and 100 grams of protein.   Exercise goals:  As is.  Behavioral modification strategies: increasing lean protein intake, decreasing simple carbohydrates, increasing vegetables, and emotional eating strategies.  Alexa Anderson has agreed to follow-up with our clinic in 8 weeks. She was informed of the importance of frequent follow-up visits to maximize her success with intensive lifestyle modifications for her multiple health conditions.   Objective:   Blood pressure 123/78, pulse 65, temperature 98.1 F (36.7 C), temperature source Oral, height '5\' 8"'$  (1.727 m), weight 274 lb (124.3  kg), SpO2 97 %. Body mass index is 41.66 kg/m.  General: Cooperative, alert, well developed, in no acute distress. HEENT: Conjunctivae and lids unremarkable. Cardiovascular: Regular rhythm.  Lungs: Normal work of breathing. Neurologic: No focal  deficits.   Lab Results  Component Value Date   CREATININE 0.60 05/09/2021   BUN 12 05/09/2021   NA 137 05/09/2021   K 4.2 05/09/2021   CL 102 05/09/2021   CO2 31 05/09/2021   Lab Results  Component Value Date   ALT 35 05/09/2021   AST 25 05/09/2021   ALKPHOS 104 05/09/2021   BILITOT 0.6 05/09/2021   Lab Results  Component Value Date   HGBA1C 5.1 05/09/2021   HGBA1C 5.5 01/02/2021   HGBA1C 5.4 03/21/2020   HGBA1C 5.3 09/05/2013   Lab Results  Component Value Date   INSULIN 9.2 01/02/2021   INSULIN 8.9 03/21/2020   Lab Results  Component Value Date   TSH 1.85 05/09/2021   Lab Results  Component Value Date   CHOL 156 05/09/2021   HDL 49.70 05/09/2021   LDLCALC 96 05/09/2021   TRIG 54.0 05/09/2021   CHOLHDL 3 05/09/2021   Lab Results  Component Value Date   VD25OH 32.6 01/02/2021   VD25OH 39.8 03/21/2020   Lab Results  Component Value Date   WBC 7.5 05/09/2021   HGB 13.1 05/09/2021   HCT 39.0 05/09/2021   MCV 86.8 05/09/2021   PLT 273.0 05/09/2021   Lab Results  Component Value Date   IRON 64 01/02/2021   TIBC 269 01/02/2021   FERRITIN 37 01/02/2021   Attestation Statements:   Reviewed by clinician on day of visit: allergies, medications, problem list, medical history, surgical history, family history, social history, and previous encounter notes.  I, Water quality scientist, CMA, am acting as transcriptionist for Briscoe Deutscher, DO  I have reviewed the above documentation for accuracy and completeness, and I agree with the above. Briscoe Deutscher, DO

## 2021-06-30 ENCOUNTER — Encounter (INDEPENDENT_AMBULATORY_CARE_PROVIDER_SITE_OTHER): Payer: Self-pay | Admitting: Family Medicine

## 2021-06-30 NOTE — Telephone Encounter (Signed)
Last OV with Dr Wallace 

## 2021-07-02 ENCOUNTER — Other Ambulatory Visit (HOSPITAL_BASED_OUTPATIENT_CLINIC_OR_DEPARTMENT_OTHER): Payer: Self-pay

## 2021-07-03 ENCOUNTER — Other Ambulatory Visit (HOSPITAL_BASED_OUTPATIENT_CLINIC_OR_DEPARTMENT_OTHER): Payer: Self-pay

## 2021-07-15 ENCOUNTER — Ambulatory Visit (INDEPENDENT_AMBULATORY_CARE_PROVIDER_SITE_OTHER): Payer: BC Managed Care – PPO | Admitting: Family Medicine

## 2021-07-31 ENCOUNTER — Ambulatory Visit (INDEPENDENT_AMBULATORY_CARE_PROVIDER_SITE_OTHER): Payer: BC Managed Care – PPO | Admitting: Family Medicine

## 2021-08-06 ENCOUNTER — Other Ambulatory Visit: Payer: Self-pay

## 2021-08-06 ENCOUNTER — Other Ambulatory Visit (HOSPITAL_BASED_OUTPATIENT_CLINIC_OR_DEPARTMENT_OTHER): Payer: Self-pay

## 2021-08-06 ENCOUNTER — Encounter (INDEPENDENT_AMBULATORY_CARE_PROVIDER_SITE_OTHER): Payer: Self-pay | Admitting: Family Medicine

## 2021-08-06 ENCOUNTER — Ambulatory Visit (INDEPENDENT_AMBULATORY_CARE_PROVIDER_SITE_OTHER): Payer: BC Managed Care – PPO | Admitting: Family Medicine

## 2021-08-06 VITALS — BP 118/78 | HR 65 | Temp 98.0°F | Ht 68.0 in | Wt 271.0 lb

## 2021-08-06 DIAGNOSIS — R7301 Impaired fasting glucose: Secondary | ICD-10-CM | POA: Diagnosis not present

## 2021-08-06 DIAGNOSIS — J4540 Moderate persistent asthma, uncomplicated: Secondary | ICD-10-CM | POA: Diagnosis not present

## 2021-08-06 DIAGNOSIS — I10 Essential (primary) hypertension: Secondary | ICD-10-CM

## 2021-08-06 DIAGNOSIS — Z6841 Body Mass Index (BMI) 40.0 and over, adult: Secondary | ICD-10-CM

## 2021-08-06 MED ORDER — TIRZEPATIDE 7.5 MG/0.5ML ~~LOC~~ SOAJ
7.5000 mg | SUBCUTANEOUS | 1 refills | Status: DC
  Filled 2021-08-06: qty 2, 28d supply, fill #0

## 2021-08-06 NOTE — Progress Notes (Signed)
Chief Complaint:   OBESITY Alexa Anderson is here to discuss her progress with her obesity treatment plan along with follow-up of her obesity related diagnoses. See Medical Weight Management Flowsheet for complete bioelectrical impedance results.  Today's visit was #: 24 Starting weight: 290 lbs Starting date: 03/21/2020 Weight change since last visit: 3 lbs Total lbs lost to date: 19 lbs Total weight loss percentage to date: -6.55%  Nutrition Plan: Keeping a food journal and adhering to recommended goals of 1800 calories and 100 grams of protein daily for 50% of the time. Activity: Increased walking. Anti-obesity medications: Mounjaro 5 mg subcutaneously weekly. Reported side effects: None.  Interim History: Alexa Anderson says she is doing well.  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  She is enjoying her new coworkers and culture.  Working on self-care.  She says she is tolerating Mounjaro.  Assessment/Plan:   1. Impaired fasting glucose, with polyphagia Improving, but not optimized. Current treatment: Mounjaro 5 mg subcutaneously weekly.    Plan:  Increase Mounjaro to 7.5 mg subcutaneously weekly, as per below.  She will continue to focus on protein-rich, low simple carbohydrate foods. We reviewed the importance of hydration, regular exercise for stress reduction, and restorative sleep.  - Increase tirzepatide (MOUNJARO) 7.5 MG/0.5ML Pen; Inject 7.5 mg into the skin once a week.  Dispense: 2 mL; Refill: 1  2. Moderate persistent asthma Alexa Anderson is taking Symbicort 2 puffs twice daily and has albuterol for rescue. We will continue to monitor symptoms as they relate to her weight loss journey.  3. Essential hypertension At goal. Medications: Norvasc 5 mg daily.   Plan: Avoid buying foods that are: processed, frozen, or prepackaged to avoid excess salt. We will watch for signs of hypotension as she continues lifestyle modifications.  BP Readings from Last 3 Encounters:  08/06/21 118/78   06/20/21 123/78  06/04/21 130/85   Lab Results  Component Value Date   CREATININE 0.60 05/09/2021   4. Obesity, current BMI 41.2  Course: Alexa Anderson is currently in the action stage of change. As such, her goal is to continue with weight loss efforts.   Nutrition goals: She has agreed to keeping a food journal and adhering to recommended goals of 1800 calories and 100 grams of protein.   Exercise goals:  As is.  Behavioral modification strategies: increasing lean protein intake, decreasing simple carbohydrates, increasing vegetables, increasing water intake, and emotional eating strategies.  Alexa Anderson has agreed to follow-up with our clinic in 4 weeks. She was informed of the importance of frequent follow-up visits to maximize her success with intensive lifestyle modifications for her multiple health conditions.   Objective:   Blood pressure 118/78, pulse 65, temperature 98 F (36.7 C), temperature source Oral, height 5\' 8"  (1.727 m), weight 271 lb (122.9 kg), SpO2 98 %. Body mass index is 41.21 kg/m.  General: Cooperative, alert, well developed, in no acute distress. HEENT: Conjunctivae and lids unremarkable. Cardiovascular: Regular rhythm.  Lungs: Normal work of breathing. Neurologic: No focal deficits.   Lab Results  Component Value Date   CREATININE 0.60 05/09/2021   BUN 12 05/09/2021   NA 137 05/09/2021   K 4.2 05/09/2021   CL 102 05/09/2021   CO2 31 05/09/2021   Lab Results  Component Value Date   ALT 35 05/09/2021   AST 25 05/09/2021   ALKPHOS 104 05/09/2021   BILITOT 0.6 05/09/2021   Lab Results  Component Value Date   HGBA1C 5.1 05/09/2021   HGBA1C  5.5 01/02/2021   HGBA1C 5.4 03/21/2020   HGBA1C 5.3 09/05/2013   Lab Results  Component Value Date   INSULIN 9.2 01/02/2021   INSULIN 8.9 03/21/2020   Lab Results  Component Value Date   TSH 1.85 05/09/2021   Lab Results  Component Value Date   CHOL 156 05/09/2021   HDL 49.70 05/09/2021   LDLCALC 96  05/09/2021   TRIG 54.0 05/09/2021   CHOLHDL 3 05/09/2021   Lab Results  Component Value Date   VD25OH 32.6 01/02/2021   VD25OH 39.8 03/21/2020   Lab Results  Component Value Date   WBC 7.5 05/09/2021   HGB 13.1 05/09/2021   HCT 39.0 05/09/2021   MCV 86.8 05/09/2021   PLT 273.0 05/09/2021   Lab Results  Component Value Date   IRON 64 01/02/2021   TIBC 269 01/02/2021   FERRITIN 37 01/02/2021   Attestation Statements:   Reviewed by clinician on day of visit: allergies, medications, problem list, medical history, surgical history, family history, social history, and previous encounter notes.  I, Water quality scientist, CMA, am acting as transcriptionist for Briscoe Deutscher, DO  I have reviewed the above documentation for accuracy and completeness, and I agree with the above. -  Briscoe Deutscher, DO, MS, FAAFP, DABOM - Family and Bariatric Medicine.

## 2021-08-13 ENCOUNTER — Other Ambulatory Visit (HOSPITAL_BASED_OUTPATIENT_CLINIC_OR_DEPARTMENT_OTHER): Payer: Self-pay

## 2021-08-14 ENCOUNTER — Other Ambulatory Visit (HOSPITAL_BASED_OUTPATIENT_CLINIC_OR_DEPARTMENT_OTHER): Payer: Self-pay

## 2021-09-03 ENCOUNTER — Other Ambulatory Visit: Payer: Self-pay

## 2021-09-03 ENCOUNTER — Other Ambulatory Visit (HOSPITAL_BASED_OUTPATIENT_CLINIC_OR_DEPARTMENT_OTHER): Payer: Self-pay

## 2021-09-03 ENCOUNTER — Ambulatory Visit (INDEPENDENT_AMBULATORY_CARE_PROVIDER_SITE_OTHER): Payer: 59 | Admitting: Family Medicine

## 2021-09-03 ENCOUNTER — Encounter (INDEPENDENT_AMBULATORY_CARE_PROVIDER_SITE_OTHER): Payer: Self-pay | Admitting: Family Medicine

## 2021-09-03 VITALS — BP 127/76 | HR 68 | Temp 98.2°F | Ht 68.0 in | Wt 268.0 lb

## 2021-09-03 DIAGNOSIS — I10 Essential (primary) hypertension: Secondary | ICD-10-CM

## 2021-09-03 DIAGNOSIS — J4540 Moderate persistent asthma, uncomplicated: Secondary | ICD-10-CM | POA: Diagnosis not present

## 2021-09-03 DIAGNOSIS — Z6841 Body Mass Index (BMI) 40.0 and over, adult: Secondary | ICD-10-CM

## 2021-09-03 DIAGNOSIS — R7301 Impaired fasting glucose: Secondary | ICD-10-CM | POA: Diagnosis not present

## 2021-09-03 MED ORDER — TIRZEPATIDE 7.5 MG/0.5ML ~~LOC~~ SOAJ
7.5000 mg | SUBCUTANEOUS | 0 refills | Status: DC
  Filled 2021-09-03: qty 2, 28d supply, fill #0

## 2021-09-04 ENCOUNTER — Other Ambulatory Visit (HOSPITAL_BASED_OUTPATIENT_CLINIC_OR_DEPARTMENT_OTHER): Payer: Self-pay

## 2021-09-04 MED ORDER — FLUTICASONE-SALMETEROL 100-50 MCG/ACT IN AEPB
1.0000 | INHALATION_SPRAY | Freq: Two times a day (BID) | RESPIRATORY_TRACT | 3 refills | Status: DC
Start: 2021-09-04 — End: 2021-09-15
  Filled 2021-09-04: qty 60, 30d supply, fill #0

## 2021-09-04 NOTE — Progress Notes (Signed)
Chief Complaint:   OBESITY Alexa Anderson is here to discuss her progress with her obesity treatment plan along with follow-up of her obesity related diagnoses. See Medical Weight Management Flowsheet for complete bioelectrical impedance results.  Today's visit was #: 25 Starting weight: 290 lbs Starting date: 03/21/2020 Weight change since last visit: 3 lbs Total lbs lost to date: 22 lbs Total weight loss percentage to date: -7.59%  Nutrition Plan: Keeping a food journal and adhering to recommended goals of 1800 calories and 100 grams of protein daily for 80% of the time. Activity: Increased walking. Anti-obesity medications: Mounjaro 7.5 mg subcutaneously weekly. Reported side effects: None.  Interim History: Alexa Anderson is drinking [employer] and says she is getting her protein.  Assessment/Plan:   1. Impaired fasting glucose, with polyphagia Controlled. Current treatment: Mounjaro 7.5 mg subcutaneously weekly.    Plan:  Continue Mounjaro 7.5 mg subcutaneously weekly, as per below.  She will continue to focus on protein-rich, low simple carbohydrate foods. We reviewed the importance of hydration, regular exercise for stress reduction, and restorative sleep.  - Refill tirzepatide (MOUNJARO) 7.5 MG/0.5ML Pen; Inject 7.5 mg into the skin once a week.  Dispense: 6 mL; Refill: 0  2. Moderate persistent asthma without complication Alexa Anderson is going to check and see if Advair is less expensive for her.  She also takes albuterol and Singulair.  - Start fluticasone-salmeterol (ADVAIR) 100-50 MCG/ACT AEPB; Inhale 1 puff into the lungs 2 (two) times daily.  Dispense: 60 each; Refill: 3  3. Essential hypertension At goal. Medications: Norvasc 5 mg daily.   Plan: Avoid buying foods that are: processed, frozen, or prepackaged to avoid excess salt. We will watch for signs of hypotension as she continues lifestyle modifications.  BP Readings from Last 3 Encounters:  09/03/21 127/76  08/06/21 118/78   06/20/21 123/78   Lab Results  Component Value Date   CREATININE 0.60 05/09/2021   4. Obesity, current BMI 40.8  Course: Alexa Anderson is currently in the action stage of change. As such, her goal is to continue with weight loss efforts.   Nutrition goals: She has agreed to keeping a food journal and adhering to recommended goals of 1800 calories and 100 grams of protein.   Exercise goals:  As is.  Behavioral modification strategies: increasing lean protein intake, decreasing simple carbohydrates, increasing vegetables, increasing water intake, and decreasing liquid calories.  Alexa Anderson has agreed to follow-up with our clinic in 4 weeks. She was informed of the importance of frequent follow-up visits to maximize her success with intensive lifestyle modifications for her multiple health conditions.   Objective:   Blood pressure 127/76, pulse 68, temperature 98.2 F (36.8 C), temperature source Oral, height 5\' 8"  (1.727 m), weight 268 lb (121.6 kg), SpO2 98 %. Body mass index is 40.75 kg/m.  General: Cooperative, alert, well developed, in no acute distress. HEENT: Conjunctivae and lids unremarkable. Cardiovascular: Regular rhythm.  Lungs: Normal work of breathing. Neurologic: No focal deficits.   Lab Results  Component Value Date   CREATININE 0.60 05/09/2021   BUN 12 05/09/2021   NA 137 05/09/2021   K 4.2 05/09/2021   CL 102 05/09/2021   CO2 31 05/09/2021   Lab Results  Component Value Date   ALT 35 05/09/2021   AST 25 05/09/2021   ALKPHOS 104 05/09/2021   BILITOT 0.6 05/09/2021   Lab Results  Component Value Date   HGBA1C 5.1 05/09/2021   HGBA1C 5.5 01/02/2021   HGBA1C 5.4 03/21/2020  HGBA1C 5.3 09/05/2013   Lab Results  Component Value Date   INSULIN 9.2 01/02/2021   INSULIN 8.9 03/21/2020   Lab Results  Component Value Date   TSH 1.85 05/09/2021   Lab Results  Component Value Date   CHOL 156 05/09/2021   HDL 49.70 05/09/2021   LDLCALC 96 05/09/2021   TRIG  54.0 05/09/2021   CHOLHDL 3 05/09/2021   Lab Results  Component Value Date   VD25OH 32.6 01/02/2021   VD25OH 39.8 03/21/2020   Lab Results  Component Value Date   WBC 7.5 05/09/2021   HGB 13.1 05/09/2021   HCT 39.0 05/09/2021   MCV 86.8 05/09/2021   PLT 273.0 05/09/2021   Lab Results  Component Value Date   IRON 64 01/02/2021   TIBC 269 01/02/2021   FERRITIN 37 01/02/2021   Attestation Statements:   Reviewed by clinician on day of visit: allergies, medications, problem list, medical history, surgical history, family history, social history, and previous encounter notes.  I, Water quality scientist, CMA, am acting as transcriptionist for Briscoe Deutscher, DO  I have reviewed the above documentation for accuracy and completeness, and I agree with the above. -  Briscoe Deutscher, DO, MS, FAAFP, DABOM - Family and Bariatric Medicine.

## 2021-09-14 ENCOUNTER — Encounter (INDEPENDENT_AMBULATORY_CARE_PROVIDER_SITE_OTHER): Payer: Self-pay | Admitting: Family Medicine

## 2021-09-14 DIAGNOSIS — R7301 Impaired fasting glucose: Secondary | ICD-10-CM

## 2021-09-15 ENCOUNTER — Other Ambulatory Visit (HOSPITAL_COMMUNITY): Payer: Self-pay

## 2021-09-15 ENCOUNTER — Encounter (HOSPITAL_BASED_OUTPATIENT_CLINIC_OR_DEPARTMENT_OTHER): Payer: Self-pay | Admitting: Pharmacist

## 2021-09-15 ENCOUNTER — Telehealth (INDEPENDENT_AMBULATORY_CARE_PROVIDER_SITE_OTHER): Payer: Self-pay

## 2021-09-15 ENCOUNTER — Other Ambulatory Visit (HOSPITAL_BASED_OUTPATIENT_CLINIC_OR_DEPARTMENT_OTHER): Payer: Self-pay

## 2021-09-15 DIAGNOSIS — J4540 Moderate persistent asthma, uncomplicated: Secondary | ICD-10-CM

## 2021-09-15 MED ORDER — TIRZEPATIDE 5 MG/0.5ML ~~LOC~~ SOAJ
5.0000 mg | SUBCUTANEOUS | 0 refills | Status: DC
  Filled 2021-09-15 (×2): qty 2, 28d supply, fill #0

## 2021-09-15 MED ORDER — BUDESONIDE-FORMOTEROL FUMARATE 160-4.5 MCG/ACT IN AERO
2.0000 | INHALATION_SPRAY | Freq: Two times a day (BID) | RESPIRATORY_TRACT | 3 refills | Status: DC
  Filled 2021-09-15 (×2): qty 10.2, 30d supply, fill #0
  Filled 2021-10-13 – 2021-10-17 (×4): qty 10.2, 30d supply, fill #1
  Filled 2021-11-21 – 2022-01-01 (×2): qty 10.2, 30d supply, fill #2

## 2021-09-15 NOTE — Telephone Encounter (Signed)
RX for Symbicort sent

## 2021-09-15 NOTE — Telephone Encounter (Signed)
Patient is requesting to remain on Symbicort and not switch to the other inhaler.  She needs to pick up before 6:00 today.  Her pharmacy is [facility] on [address].  Thank you

## 2021-09-16 ENCOUNTER — Other Ambulatory Visit (HOSPITAL_BASED_OUTPATIENT_CLINIC_OR_DEPARTMENT_OTHER): Payer: Self-pay

## 2021-09-25 ENCOUNTER — Encounter (INDEPENDENT_AMBULATORY_CARE_PROVIDER_SITE_OTHER): Payer: Self-pay | Admitting: Family Medicine

## 2021-09-26 ENCOUNTER — Telehealth: Payer: Self-pay | Admitting: Family Medicine

## 2021-09-26 ENCOUNTER — Other Ambulatory Visit (HOSPITAL_BASED_OUTPATIENT_CLINIC_OR_DEPARTMENT_OTHER): Payer: Self-pay

## 2021-09-26 MED ORDER — PROMETHAZINE-DM 6.25-15 MG/5ML PO SYRP: ORAL_SOLUTION | ORAL | 0 refills | Status: DC

## 2021-09-26 MED ORDER — PAXLOVID (300/100) 20 X 150 MG & 10 X 100MG PO TBPK
ORAL_TABLET | ORAL | 0 refills | Status: DC
Start: 2021-09-26 — End: 2021-11-17
  Filled 2021-09-26: qty 30, 5d supply, fill #0

## 2021-09-26 NOTE — Telephone Encounter (Signed)
Pt notified of Dr. Marliss Coots instructions and recommendations and verbalized understanding

## 2021-09-26 NOTE — Telephone Encounter (Signed)
Paxlovid may intensify the effects of amlodipine and also trazodone While she is on it -may want to cut those 2 meds in 1/2 and watch out for sedation or symptoms of low BP

## 2021-09-26 NOTE — Telephone Encounter (Signed)
Pt called stating that she tested positive for covid. Pt is asking is it ok for her  to take medication Paxlovid with all her other medication. Please advise.

## 2021-09-30 ENCOUNTER — Encounter (INDEPENDENT_AMBULATORY_CARE_PROVIDER_SITE_OTHER): Payer: Self-pay | Admitting: Adult Health

## 2021-10-13 ENCOUNTER — Other Ambulatory Visit: Payer: Self-pay

## 2021-10-13 ENCOUNTER — Other Ambulatory Visit (HOSPITAL_BASED_OUTPATIENT_CLINIC_OR_DEPARTMENT_OTHER): Payer: Self-pay

## 2021-10-13 ENCOUNTER — Encounter (INDEPENDENT_AMBULATORY_CARE_PROVIDER_SITE_OTHER): Payer: Self-pay | Admitting: Family Medicine

## 2021-10-13 ENCOUNTER — Ambulatory Visit (INDEPENDENT_AMBULATORY_CARE_PROVIDER_SITE_OTHER): Payer: 59 | Admitting: Family Medicine

## 2021-10-13 VITALS — BP 138/88 | HR 64 | Temp 98.3°F | Ht 68.0 in | Wt 277.0 lb

## 2021-10-13 DIAGNOSIS — F411 Generalized anxiety disorder: Secondary | ICD-10-CM

## 2021-10-13 DIAGNOSIS — Z6841 Body Mass Index (BMI) 40.0 and over, adult: Secondary | ICD-10-CM

## 2021-10-13 DIAGNOSIS — R7301 Impaired fasting glucose: Secondary | ICD-10-CM

## 2021-10-13 DIAGNOSIS — J4540 Moderate persistent asthma, uncomplicated: Secondary | ICD-10-CM | POA: Diagnosis not present

## 2021-10-13 MED ORDER — ESCITALOPRAM OXALATE 20 MG PO TABS
20.0000 mg | ORAL_TABLET | Freq: Every day | ORAL | 0 refills | Status: DC
  Filled 2021-10-13: qty 30, 30d supply, fill #0
  Filled 2021-11-10: qty 30, 30d supply, fill #1

## 2021-10-14 NOTE — Progress Notes (Signed)
Chief Complaint:   OBESITY Alexa Anderson is here to discuss her progress with her obesity treatment plan along with follow-up of her obesity related diagnoses. See Medical Weight Management Flowsheet for complete bioelectrical impedance results.  Today's visit was #: 78 Starting weight: 290 lbs Starting date: 03/21/2020 Weight change since last visit: +9 lbs Total lbs lost to date: 13 lbs Total weight loss percentage to date: -4.48%  Nutrition Plan: Keeping a food journal and adhering to recommended goals of 1800 calories and 100 grams of protein daily for 0% of the time. Activity: None. Anti-obesity medications: Mounjaro 5 mg subcutaneously weekly. Reported side effects: None.  Interim History: Alexa Anderson is on the 5 mg dose of Mounjaro (has 2-3 more).  She says she had COVID and was prescribed Paxlovid, but felt scared to take other medications while on it.  She stopped all medications x 5 days.  She says that work stress is very high, working 70-80 hours per week.  She is looking for a different job.  Assessment/Plan:   1. Impaired fasting glucose Controlled. Current treatment: Mounjaro 5 mg subcutaneously weekly.     Plan:  Continue Mounjaro 5 mg subcutaneously weekly.  She will continue to focus on protein-rich, low simple carbohydrate foods. We reviewed the importance of hydration, regular exercise for stress reduction, and restorative sleep.  2. Moderate persistent asthma without complication Meilin takes albuterol, Singulair, and Symbicort for her asthma. Improving again after recent COVID illness.  3. GAD (generalized anxiety disorder) Donnamae is taking Lexapro 20 mg daily and propranolol 20 mg three times daily for anxiety.  Plan:  Continue Lexapro 20 mg daily.  Will refill today, as per below.   - Refill escitalopram (LEXAPRO) 20 MG tablet; Take 1 tablet (20 mg total) by mouth daily.  Dispense: 90 tablet; Refill: 0  4. Obesity, current BMI 42.1  Course: Alexa Anderson is currently  in the action stage of change. As such, her goal is to continue with weight loss efforts.   Nutrition goals: She has agreed to the Category 3 Plan.   Exercise goals:  Increase NEAT.  Behavioral modification strategies: increasing lean protein intake, decreasing simple carbohydrates, increasing vegetables, and increasing water intake.  Alexa Anderson has agreed to follow-up with our clinic in 4 weeks. She was informed of the importance of frequent follow-up visits to maximize her success with intensive lifestyle modifications for her multiple health conditions.   Objective:   Blood pressure 138/88, pulse 64, temperature 98.3 F (36.8 C), temperature source Oral, height 5\' 8"  (1.727 m), weight 277 lb (125.6 kg), SpO2 96 %. Body mass index is 42.12 kg/m.  General: Cooperative, alert, well developed, in no acute distress. HEENT: Conjunctivae and lids unremarkable. Cardiovascular: Regular rhythm.  Lungs: Normal work of breathing. Neurologic: No focal deficits.   Lab Results  Component Value Date   CREATININE 0.60 05/09/2021   BUN 12 05/09/2021   NA 137 05/09/2021   K 4.2 05/09/2021   CL 102 05/09/2021   CO2 31 05/09/2021   Lab Results  Component Value Date   ALT 35 05/09/2021   AST 25 05/09/2021   ALKPHOS 104 05/09/2021   BILITOT 0.6 05/09/2021   Lab Results  Component Value Date   HGBA1C 5.1 05/09/2021   HGBA1C 5.5 01/02/2021   HGBA1C 5.4 03/21/2020   HGBA1C 5.3 09/05/2013   Lab Results  Component Value Date   INSULIN 9.2 01/02/2021   INSULIN 8.9 03/21/2020   Lab Results  Component Value Date  TSH 1.85 05/09/2021   Lab Results  Component Value Date   CHOL 156 05/09/2021   HDL 49.70 05/09/2021   LDLCALC 96 05/09/2021   TRIG 54.0 05/09/2021   CHOLHDL 3 05/09/2021   Lab Results  Component Value Date   VD25OH 32.6 01/02/2021   VD25OH 39.8 03/21/2020   Lab Results  Component Value Date   WBC 7.5 05/09/2021   HGB 13.1 05/09/2021   HCT 39.0 05/09/2021   MCV 86.8  05/09/2021   PLT 273.0 05/09/2021   Lab Results  Component Value Date   IRON 64 01/02/2021   TIBC 269 01/02/2021   FERRITIN 37 01/02/2021   Attestation Statements:   Reviewed by clinician on day of visit: allergies, medications, problem list, medical history, surgical history, family history, social history, and previous encounter notes.  I, Water quality scientist, CMA, am acting as transcriptionist for Briscoe Deutscher, DO  I have reviewed the above documentation for accuracy and completeness, and I agree with the above. -  Briscoe Deutscher, DO, MS, FAAFP, DABOM - Family and Bariatric Medicine.

## 2021-10-15 ENCOUNTER — Other Ambulatory Visit (HOSPITAL_BASED_OUTPATIENT_CLINIC_OR_DEPARTMENT_OTHER): Payer: Self-pay

## 2021-10-17 ENCOUNTER — Other Ambulatory Visit (HOSPITAL_BASED_OUTPATIENT_CLINIC_OR_DEPARTMENT_OTHER): Payer: Self-pay

## 2021-10-22 ENCOUNTER — Other Ambulatory Visit (INDEPENDENT_AMBULATORY_CARE_PROVIDER_SITE_OTHER): Payer: Self-pay | Admitting: Family Medicine

## 2021-10-22 ENCOUNTER — Other Ambulatory Visit (HOSPITAL_COMMUNITY): Payer: Self-pay

## 2021-10-22 ENCOUNTER — Other Ambulatory Visit (HOSPITAL_BASED_OUTPATIENT_CLINIC_OR_DEPARTMENT_OTHER): Payer: Self-pay

## 2021-10-22 DIAGNOSIS — R062 Wheezing: Secondary | ICD-10-CM

## 2021-10-22 DIAGNOSIS — R7301 Impaired fasting glucose: Secondary | ICD-10-CM

## 2021-10-22 MED ORDER — MOUNJARO 5 MG/0.5ML ~~LOC~~ SOAJ
5.0000 mg | SUBCUTANEOUS | 0 refills | Status: DC
  Filled 2021-10-22 – 2021-10-24 (×2): qty 2, 28d supply, fill #0

## 2021-10-22 MED ORDER — ALBUTEROL SULFATE HFA 108 (90 BASE) MCG/ACT IN AERS
1.0000 | INHALATION_SPRAY | Freq: Four times a day (QID) | RESPIRATORY_TRACT | 0 refills | Status: DC | PRN
  Filled 2021-10-22: qty 8.5, 28d supply, fill #0

## 2021-10-22 NOTE — Telephone Encounter (Signed)
Dr.Wallace °

## 2021-10-23 ENCOUNTER — Other Ambulatory Visit (HOSPITAL_BASED_OUTPATIENT_CLINIC_OR_DEPARTMENT_OTHER): Payer: Self-pay

## 2021-10-24 ENCOUNTER — Other Ambulatory Visit (HOSPITAL_BASED_OUTPATIENT_CLINIC_OR_DEPARTMENT_OTHER): Payer: Self-pay

## 2021-10-27 ENCOUNTER — Other Ambulatory Visit (HOSPITAL_BASED_OUTPATIENT_CLINIC_OR_DEPARTMENT_OTHER): Payer: Self-pay

## 2021-10-27 ENCOUNTER — Other Ambulatory Visit (HOSPITAL_COMMUNITY): Payer: Self-pay

## 2021-11-10 ENCOUNTER — Other Ambulatory Visit (HOSPITAL_BASED_OUTPATIENT_CLINIC_OR_DEPARTMENT_OTHER): Payer: Self-pay

## 2021-11-17 ENCOUNTER — Other Ambulatory Visit (INDEPENDENT_AMBULATORY_CARE_PROVIDER_SITE_OTHER): Payer: Self-pay | Admitting: Family Medicine

## 2021-11-17 ENCOUNTER — Encounter (HOSPITAL_BASED_OUTPATIENT_CLINIC_OR_DEPARTMENT_OTHER): Payer: Self-pay

## 2021-11-17 ENCOUNTER — Encounter (INDEPENDENT_AMBULATORY_CARE_PROVIDER_SITE_OTHER): Payer: Self-pay | Admitting: Family Medicine

## 2021-11-17 ENCOUNTER — Other Ambulatory Visit: Payer: Self-pay

## 2021-11-17 ENCOUNTER — Other Ambulatory Visit (HOSPITAL_BASED_OUTPATIENT_CLINIC_OR_DEPARTMENT_OTHER): Payer: Self-pay

## 2021-11-17 ENCOUNTER — Ambulatory Visit (INDEPENDENT_AMBULATORY_CARE_PROVIDER_SITE_OTHER): Payer: 59 | Admitting: Family Medicine

## 2021-11-17 VITALS — BP 134/84 | HR 59 | Temp 98.0°F | Ht 68.0 in | Wt 278.0 lb

## 2021-11-17 DIAGNOSIS — R632 Polyphagia: Secondary | ICD-10-CM | POA: Diagnosis not present

## 2021-11-17 DIAGNOSIS — Z6841 Body Mass Index (BMI) 40.0 and over, adult: Secondary | ICD-10-CM

## 2021-11-17 DIAGNOSIS — E669 Obesity, unspecified: Secondary | ICD-10-CM

## 2021-11-17 DIAGNOSIS — F4323 Adjustment disorder with mixed anxiety and depressed mood: Secondary | ICD-10-CM | POA: Diagnosis not present

## 2021-11-17 DIAGNOSIS — J4540 Moderate persistent asthma, uncomplicated: Secondary | ICD-10-CM | POA: Diagnosis not present

## 2021-11-17 DIAGNOSIS — R7301 Impaired fasting glucose: Secondary | ICD-10-CM

## 2021-11-17 DIAGNOSIS — F411 Generalized anxiety disorder: Secondary | ICD-10-CM

## 2021-11-17 DIAGNOSIS — J45909 Unspecified asthma, uncomplicated: Secondary | ICD-10-CM

## 2021-11-17 NOTE — Telephone Encounter (Signed)
Dr.Wallace °

## 2021-11-17 NOTE — Telephone Encounter (Signed)
LOV w/ [place]

## 2021-11-18 ENCOUNTER — Other Ambulatory Visit (HOSPITAL_BASED_OUTPATIENT_CLINIC_OR_DEPARTMENT_OTHER): Payer: Self-pay

## 2021-11-18 MED ORDER — PHENTERMINE HCL 37.5 MG PO TABS
37.5000 mg | ORAL_TABLET | Freq: Every day | ORAL | 0 refills | Status: DC
  Filled 2021-11-18: qty 90, 90d supply, fill #0

## 2021-11-18 MED ORDER — MOUNJARO 5 MG/0.5ML ~~LOC~~ SOAJ
5.0000 mg | SUBCUTANEOUS | 0 refills | Status: DC
  Filled 2021-11-18: qty 2, 28d supply, fill #0

## 2021-11-18 NOTE — Progress Notes (Signed)
Chief Complaint:   OBESITY Alexa Anderson is here to discuss her progress with her obesity treatment plan along with follow-up of her obesity related diagnoses. See Medical Weight Management Flowsheet for complete bioelectrical impedance results.  Today's visit was #: 19 Starting weight: 290 lbs Starting date: 03/21/2020 Weight change since last visit: +1 lb Total lbs lost to date: 12 lbs Total weight loss percentage to date: -4.14%  Nutrition Plan: Category 3 Plan for 50% of the time.  Activity: Increased walking. Anti-obesity medications: Mounjaro 5 mg subcutaneously weekly. Reported side effects: None.  Interim History: Alexa Anderson will be starting a new job on 2/27 and will have Alexa Anderson on 3/1.  She says she has been under a lot of stress and has been doing more emotional eating.  Assessment/Plan:   1. Polyphagia Not at goal. Current treatment: None.    Plan: Start phentermine 37.5 mg daily.  She will continue to focus on protein-rich, low simple carbohydrate foods. We reviewed the importance of hydration, regular exercise for stress reduction, and restorative sleep.  We reviewed potential side effects including insomnia, dry mouth, increased heart rate and blood pressure, and increased anxiety. We reviewed reducing caffeine consumption while taking phentermine, especially if the patient is experiencing side effects. Alternative treatment options were discussed. All questions were answered, and the patient wishes to move forward with this medication.  I have consulted the [place] Controlled Substances Registry for this patient, and feel the risk/benefit ratio today is favorable for proceeding with this prescription for a controlled substance. The patient understands monitoring parameters and red flags.   2. Impaired fasting glucose Alexa Anderson is taking Mounjaro 5 mg subcutaneously weekly.  Plan:  Continue Mounjaro 5 mg subcutaneously weekly.  Will refill today.  3. Asthma, moderate  persistent Alexa Anderson takes albuterol and Symbicort for asthma control. The current medical regimen is effective;  continue present plan and medications.  4. Situational mixed anxiety and depressive disorder Alexa Anderson is taking Lexapro 20 mg daily.  She is giving notice at her current job today.  Her new job will start at the end of February.  5. Obesity, current BMI 42.3  Course: Alexa Anderson is currently in the action stage of change. As such, her goal is to continue with weight loss efforts.   Nutrition goals: She has agreed to practicing portion control and making smarter food choices, such as increasing vegetables and decreasing simple carbohydrates.   Exercise goals:  As is.  Behavioral modification strategies: increasing lean protein intake, decreasing simple carbohydrates, increasing vegetables, and increasing water intake.  Alexa Anderson has agreed to follow-up with our clinic in 2 weeks. She was informed of the importance of frequent follow-up visits to maximize her success with intensive lifestyle modifications for her multiple health conditions.   Objective:   Blood pressure 134/84, pulse (!) 59, temperature 98 F (36.7 C), temperature source Oral, height 5\' 8"  (1.727 m), weight 278 lb (126.1 kg), SpO2 97 %. Body mass index is 42.27 kg/m.  General: Cooperative, alert, well developed, in no acute distress. HEENT: Conjunctivae and lids unremarkable. Cardiovascular: Regular rhythm.  Lungs: Normal work of breathing. Neurologic: No focal deficits.   Lab Results  Component Value Date   CREATININE 0.60 05/09/2021   BUN 12 05/09/2021   NA 137 05/09/2021   K 4.2 05/09/2021   CL 102 05/09/2021   CO2 31 05/09/2021   Lab Results  Component Value Date   ALT 35 05/09/2021   AST 25 05/09/2021   ALKPHOS 104 05/09/2021  BILITOT 0.6 05/09/2021   Lab Results  Component Value Date   HGBA1C 5.1 05/09/2021   HGBA1C 5.5 01/02/2021   HGBA1C 5.4 03/21/2020   HGBA1C 5.3 09/05/2013   Lab Results   Component Value Date   INSULIN 9.2 01/02/2021   INSULIN 8.9 03/21/2020   Lab Results  Component Value Date   TSH 1.85 05/09/2021   Lab Results  Component Value Date   CHOL 156 05/09/2021   HDL 49.70 05/09/2021   LDLCALC 96 05/09/2021   TRIG 54.0 05/09/2021   CHOLHDL 3 05/09/2021   Lab Results  Component Value Date   VD25OH 32.6 01/02/2021   VD25OH 39.8 03/21/2020   Lab Results  Component Value Date   WBC 7.5 05/09/2021   HGB 13.1 05/09/2021   HCT 39.0 05/09/2021   MCV 86.8 05/09/2021   PLT 273.0 05/09/2021   Lab Results  Component Value Date   IRON 64 01/02/2021   TIBC 269 01/02/2021   FERRITIN 37 01/02/2021   Attestation Statements:   Reviewed by clinician on day of visit: allergies, medications, problem list, medical history, surgical history, family history, social history, and previous encounter notes.  I, Water quality scientist, CMA, am acting as transcriptionist for Briscoe Deutscher, DO  I have reviewed the above documentation for accuracy and completeness, and I agree with the above. -  Briscoe Deutscher, DO, MS, FAAFP, DABOM - Family and Bariatric Medicine.

## 2021-11-21 ENCOUNTER — Other Ambulatory Visit (HOSPITAL_BASED_OUTPATIENT_CLINIC_OR_DEPARTMENT_OTHER): Payer: Self-pay

## 2021-11-21 ENCOUNTER — Ambulatory Visit: Payer: BC Managed Care – PPO | Admitting: Family Medicine

## 2021-12-02 ENCOUNTER — Encounter (INDEPENDENT_AMBULATORY_CARE_PROVIDER_SITE_OTHER): Payer: Self-pay | Admitting: Family Medicine

## 2021-12-02 ENCOUNTER — Ambulatory Visit (INDEPENDENT_AMBULATORY_CARE_PROVIDER_SITE_OTHER): Payer: 59 | Admitting: Family Medicine

## 2021-12-02 ENCOUNTER — Other Ambulatory Visit (HOSPITAL_BASED_OUTPATIENT_CLINIC_OR_DEPARTMENT_OTHER): Payer: Self-pay

## 2021-12-02 ENCOUNTER — Other Ambulatory Visit: Payer: Self-pay

## 2021-12-02 VITALS — BP 132/85 | HR 68 | Temp 97.6°F | Ht 68.0 in | Wt 275.0 lb

## 2021-12-02 DIAGNOSIS — F4323 Adjustment disorder with mixed anxiety and depressed mood: Secondary | ICD-10-CM

## 2021-12-02 DIAGNOSIS — R7301 Impaired fasting glucose: Secondary | ICD-10-CM | POA: Diagnosis not present

## 2021-12-02 DIAGNOSIS — Z6841 Body Mass Index (BMI) 40.0 and over, adult: Secondary | ICD-10-CM

## 2021-12-02 DIAGNOSIS — E669 Obesity, unspecified: Secondary | ICD-10-CM

## 2021-12-02 DIAGNOSIS — J4540 Moderate persistent asthma, uncomplicated: Secondary | ICD-10-CM | POA: Diagnosis not present

## 2021-12-02 MED ORDER — TIRZEPATIDE 7.5 MG/0.5ML ~~LOC~~ SOAJ
7.5000 mg | SUBCUTANEOUS | 0 refills | Status: DC
  Filled 2021-12-02: qty 2, 28d supply, fill #0

## 2021-12-04 ENCOUNTER — Other Ambulatory Visit (HOSPITAL_BASED_OUTPATIENT_CLINIC_OR_DEPARTMENT_OTHER): Payer: Self-pay

## 2021-12-08 NOTE — Progress Notes (Signed)
Chief Complaint:   OBESITY Alexa Anderson is here to discuss her progress with her obesity treatment plan along with follow-up of her obesity related diagnoses.   Today's visit was #: 28 Starting weight: 290 lbs Starting date: 03/21/2020 Today's weight: 275 lbs Today's date: 12/02/2021 Weight change since last visit: -3 lbs Total lbs lost to date: 15 lbs Body mass index is 41.81 kg/m.  Total weight loss percentage to date: -5.17%  Current Meal Plan: keeping a food journal and adhering to recommended goals of 1800 calories and 100 grams of protein for 70-80% of the time.  Current Exercise Plan: Increased walking. Current Anti-Obesity Medications: Mounjaro 5 MG subcutaneously weekly, Phentermine 18.75 MG daily. Side effects: None.  Interim History: Shamaine reports the she is still working at [employer] on the weekends. She is taking Phentermine 1/2 tablet daily. She has found a new snack to help with chocolate cravings (Lily's chocolate snack pack).  Assessment/Plan:   1. Impaired fasting glucose Ellery is taking Mounjaro 5 mg subcutaneously weekly.She will continue to focus on protein-rich, low simple carbohydrate foods. We reviewed the importance of hydration, regular exercise for stress reduction, and restorative sleep.    Plan: The current medical regimen is effective;  continue present plan and medications.  - tirzepatide (MOUNJARO) 7.5 MG/0.5ML Pen; Inject 7.5 mg into the skin once a week.  Dispense: 2 mL; Refill: 0  2. Moderate persistent asthma without complication Jamileth takes albuterol and Symbicort for asthma control. The current medical regimen is effective; continue present plan and medications.  3. Situational mixed anxiety and depressive disorder Nariyah is taking Lexapro 20 mg daily.  Specifically regarding patient's less desirable eating habits and patterns, we employed the technique of small changes when she cannot fully commit to her prudent nutritional  plan.  4. Obesity, current BMI 41.9 Course: Tyliah is currently in the action stage of change. As such, her goal is to continue with weight loss efforts.   Nutrition goals: She has agreed to keeping a food journal and adhering to recommended goals of 1800 calories and 100 grams of protein.   Exercise goals: As is.  Behavioral modification strategies: increasing lean protein intake, decreasing simple carbohydrates, and increasing vegetables.  Alexa Anderson has agreed to follow-up with our clinic in 4 weeks. She was informed of the importance of frequent follow-up visits to maximize her success with intensive lifestyle modifications for her multiple health conditions.   Objective:   Blood pressure 132/85, pulse 68, temperature 97.6 F (36.4 C), temperature source Oral, height '5\' 8"'$  (1.727 m), weight 275 lb (124.7 kg), SpO2 98 %. Body mass index is 41.81 kg/m.  General: Cooperative, alert, well developed, in no acute distress. HEENT: Conjunctivae and lids unremarkable. Cardiovascular: Regular rhythm.  Lungs: Normal work of breathing. Neurologic: No focal deficits.   Lab Results  Component Value Date   CREATININE 0.60 05/09/2021   BUN 12 05/09/2021   NA 137 05/09/2021   K 4.2 05/09/2021   CL 102 05/09/2021   CO2 31 05/09/2021   Lab Results  Component Value Date   ALT 35 05/09/2021   AST 25 05/09/2021   ALKPHOS 104 05/09/2021   BILITOT 0.6 05/09/2021   Lab Results  Component Value Date   HGBA1C 5.1 05/09/2021   HGBA1C 5.5 01/02/2021   HGBA1C 5.4 03/21/2020   HGBA1C 5.3 09/05/2013   Lab Results  Component Value Date   INSULIN 9.2 01/02/2021   INSULIN 8.9 03/21/2020   Lab Results  Component Value  Date   TSH 1.85 05/09/2021   Lab Results  Component Value Date   CHOL 156 05/09/2021   HDL 49.70 05/09/2021   LDLCALC 96 05/09/2021   TRIG 54.0 05/09/2021   CHOLHDL 3 05/09/2021   Lab Results  Component Value Date   VD25OH 32.6 01/02/2021   VD25OH 39.8 03/21/2020    Lab Results  Component Value Date   WBC 7.5 05/09/2021   HGB 13.1 05/09/2021   HCT 39.0 05/09/2021   MCV 86.8 05/09/2021   PLT 273.0 05/09/2021   Lab Results  Component Value Date   IRON 64 01/02/2021   TIBC 269 01/02/2021   FERRITIN 37 01/02/2021   Attestation Statements:   Reviewed by clinician on day of visit: allergies, medications, problem list, medical history, surgical history, family history, social history, and previous encounter notes.  Leodis Binet Friedenbach, CMA, am acting as Location manager for [employer], DO.  I have reviewed the above documentation for accuracy and completeness, and I agree with the above. -  Briscoe Deutscher, DO, MS, FAAFP, DABOM - Family and Bariatric Medicine.

## 2021-12-16 ENCOUNTER — Ambulatory Visit (INDEPENDENT_AMBULATORY_CARE_PROVIDER_SITE_OTHER): Payer: BC Managed Care – PPO | Admitting: Family Medicine

## 2021-12-16 ENCOUNTER — Other Ambulatory Visit: Payer: Self-pay

## 2021-12-16 ENCOUNTER — Encounter (INDEPENDENT_AMBULATORY_CARE_PROVIDER_SITE_OTHER): Payer: Self-pay | Admitting: Family Medicine

## 2021-12-16 VITALS — BP 137/83 | HR 76 | Temp 98.5°F | Ht 68.0 in | Wt 275.0 lb

## 2021-12-16 DIAGNOSIS — J302 Other seasonal allergic rhinitis: Secondary | ICD-10-CM

## 2021-12-16 DIAGNOSIS — Z6841 Body Mass Index (BMI) 40.0 and over, adult: Secondary | ICD-10-CM

## 2021-12-16 DIAGNOSIS — E669 Obesity, unspecified: Secondary | ICD-10-CM

## 2021-12-16 DIAGNOSIS — R7301 Impaired fasting glucose: Secondary | ICD-10-CM

## 2021-12-16 DIAGNOSIS — F411 Generalized anxiety disorder: Secondary | ICD-10-CM

## 2021-12-16 DIAGNOSIS — J3489 Other specified disorders of nose and nasal sinuses: Secondary | ICD-10-CM

## 2021-12-17 ENCOUNTER — Encounter (HOSPITAL_BASED_OUTPATIENT_CLINIC_OR_DEPARTMENT_OTHER): Payer: Self-pay | Admitting: Pharmacist

## 2021-12-17 ENCOUNTER — Other Ambulatory Visit (HOSPITAL_BASED_OUTPATIENT_CLINIC_OR_DEPARTMENT_OTHER): Payer: Self-pay

## 2021-12-17 MED ORDER — MONTELUKAST SODIUM 10 MG PO TABS
ORAL_TABLET | ORAL | 3 refills | Status: DC
  Filled 2021-12-17: qty 90, fill #0
  Filled 2021-12-17: qty 90, 90d supply, fill #0
  Filled 2022-01-01: qty 30, 30d supply, fill #0

## 2021-12-17 MED ORDER — BACITRACIN 500 UNIT/GM EX OINT
1.0000 "application " | TOPICAL_OINTMENT | Freq: Two times a day (BID) | CUTANEOUS | 0 refills | Status: DC
  Filled 2021-12-17: qty 15, 8d supply, fill #0

## 2021-12-17 MED ORDER — ESCITALOPRAM OXALATE 20 MG PO TABS
20.0000 mg | ORAL_TABLET | Freq: Every day | ORAL | 0 refills | Status: DC
  Filled 2021-12-17: qty 90, 90d supply, fill #0
  Filled 2022-01-01: qty 30, 30d supply, fill #0

## 2021-12-17 MED ORDER — TIRZEPATIDE 7.5 MG/0.5ML ~~LOC~~ SOAJ
7.5000 mg | SUBCUTANEOUS | 0 refills | Status: DC
  Filled 2021-12-17 – 2022-02-03 (×3): qty 2, 28d supply, fill #0

## 2021-12-18 NOTE — Progress Notes (Signed)
Chief Complaint:   OBESITY Alexa Anderson is here to discuss her progress with her obesity treatment plan along with follow-up of her obesity related diagnoses. See Medical Weight Management Flowsheet for complete bioelectrical impedance results.  Today's visit was #: 32 Starting weight: 290 lbs Starting date: 03/21/2020 Weight change since last visit: 0 Total lbs lost to date: 15 lbs Total weight loss percentage to date: -5.17%  Nutrition Plan: Keeping a food journal and adhering to recommended goals of 1800 calories and 100 grams of protein daily for 70% of the time. Activity: Increased walking. Anti-obesity medications: Mounjaro 5 mg subcutaneously weekly and phentermine 37.5 mg daily. Reported side effects: None.  Interim History: Alexa Anderson says she has not been sleeping.  She has been working 2 jobs.  She says she has stopped eating bananas and it helped her cough.  Assessment/Plan:   1. Seasonal allergies Yesmin is taking Singulair, albuterol, and Symbicort for allergies.  Plan:  Recommend an antihistamine such as Allegra and will refill Singulair today as well as place a referral to Allergy.  - Refill montelukast (SINGULAIR) 10 MG tablet; TAKE 1 TABLET BY MOUTH EVERYDAY AT BEDTIME  Dispense: 90 tablet; Refill: 3 - Ambulatory referral to Allergy  2. Impaired fasting glucose Lashonna is still taking Mounjaro 5 mg subcutaneously weekly.  Plan:  Increase Mounjaro to 7.5 mg subcutaneously weekly.  Will refill today, as per below.  - Refill tirzepatide (MOUNJARO) 7.5 MG/0.5ML Pen; Inject 7.5 mg into the skin once a week.  Dispense: 2 mL; Refill: 0  3. Dry nares Start bacitracin ointment 1 application to dry nares twice daily as needed.  - Start bacitracin 500 UNIT/GM ointment; Apply 1 application. topically 2 (two) times daily.  Dispense: 15 g; Refill: 0  4. GAD (generalized anxiety disorder) Alexa Anderson is taking Lexapro 20 mg daily.  Specifically regarding patient's less desirable  eating habits and patterns, we employed the technique of small changes when she cannot fully commit to her prudent nutritional plan.  She will continue Lexapro at the current dose.  Will refill today.  - Refill escitalopram (LEXAPRO) 20 MG tablet; Take 1 tablet (20 mg total) by mouth daily.  Dispense: 90 tablet; Refill: 0  5. Obesity, current BMI 41.9  Course: Alexa Anderson is currently in the action stage of change. As such, her goal is to continue with weight loss efforts.   Nutrition goals: She has agreed to keeping a food journal and adhering to recommended goals of 1800 calories and 100 grams of protein.   Exercise goals:  As is.  Behavioral modification strategies: increasing lean protein intake, decreasing simple carbohydrates, and increasing vegetables.  Alexa Anderson has agreed to follow-up with our clinic in 4 weeks. She was informed of the importance of frequent follow-up visits to maximize her success with intensive lifestyle modifications for her multiple health conditions.   Objective:   Blood pressure 137/83, pulse 76, temperature 98.5 F (36.9 C), temperature source Oral, height '5\' 8"'$  (1.727 m), weight 275 lb (124.7 kg), SpO2 97 %. Body mass index is 41.81 kg/m.  General: Cooperative, alert, well developed, in no acute distress. HEENT: Conjunctivae and lids unremarkable. Cardiovascular: Regular rhythm.  Lungs: Normal work of breathing. Neurologic: No focal deficits.   Lab Results  Component Value Date   CREATININE 0.60 05/09/2021   BUN 12 05/09/2021   NA 137 05/09/2021   K 4.2 05/09/2021   CL 102 05/09/2021   CO2 31 05/09/2021   Lab Results  Component Value Date  ALT 35 05/09/2021   AST 25 05/09/2021   ALKPHOS 104 05/09/2021   BILITOT 0.6 05/09/2021   Lab Results  Component Value Date   HGBA1C 5.1 05/09/2021   HGBA1C 5.5 01/02/2021   HGBA1C 5.4 03/21/2020   HGBA1C 5.3 09/05/2013   Lab Results  Component Value Date   INSULIN 9.2 01/02/2021   INSULIN 8.9  03/21/2020   Lab Results  Component Value Date   TSH 1.85 05/09/2021   Lab Results  Component Value Date   CHOL 156 05/09/2021   HDL 49.70 05/09/2021   LDLCALC 96 05/09/2021   TRIG 54.0 05/09/2021   CHOLHDL 3 05/09/2021   Lab Results  Component Value Date   VD25OH 32.6 01/02/2021   VD25OH 39.8 03/21/2020   Lab Results  Component Value Date   WBC 7.5 05/09/2021   HGB 13.1 05/09/2021   HCT 39.0 05/09/2021   MCV 86.8 05/09/2021   PLT 273.0 05/09/2021   Lab Results  Component Value Date   IRON 64 01/02/2021   TIBC 269 01/02/2021   FERRITIN 37 01/02/2021   Attestation Statements:   Reviewed by clinician on day of visit: allergies, medications, problem list, medical history, surgical history, family history, social history, and previous encounter notes.  I, Water quality scientist, CMA, am acting as transcriptionist for Briscoe Deutscher, DO  I have reviewed the above documentation for accuracy and completeness, and I agree with the above. -  Briscoe Deutscher, DO, MS, FAAFP, DABOM - Family and Bariatric Medicine.

## 2021-12-22 ENCOUNTER — Other Ambulatory Visit (HOSPITAL_BASED_OUTPATIENT_CLINIC_OR_DEPARTMENT_OTHER): Payer: Self-pay

## 2021-12-30 ENCOUNTER — Ambulatory Visit (INDEPENDENT_AMBULATORY_CARE_PROVIDER_SITE_OTHER): Payer: 59 | Admitting: Family Medicine

## 2022-01-01 ENCOUNTER — Encounter: Payer: Self-pay | Admitting: Family Medicine

## 2022-01-01 ENCOUNTER — Encounter (HOSPITAL_BASED_OUTPATIENT_CLINIC_OR_DEPARTMENT_OTHER): Payer: Self-pay | Admitting: Pharmacist

## 2022-01-01 ENCOUNTER — Ambulatory Visit (INDEPENDENT_AMBULATORY_CARE_PROVIDER_SITE_OTHER): Payer: BLUE CROSS/BLUE SHIELD | Admitting: Family Medicine

## 2022-01-01 ENCOUNTER — Ambulatory Visit: Payer: BC Managed Care – PPO | Admitting: Family Medicine

## 2022-01-01 ENCOUNTER — Other Ambulatory Visit (INDEPENDENT_AMBULATORY_CARE_PROVIDER_SITE_OTHER): Payer: Self-pay | Admitting: Family Medicine

## 2022-01-01 ENCOUNTER — Other Ambulatory Visit (HOSPITAL_BASED_OUTPATIENT_CLINIC_OR_DEPARTMENT_OTHER): Payer: Self-pay

## 2022-01-01 VITALS — BP 128/84 | HR 76 | Temp 97.7°F | Ht 68.0 in | Wt 278.2 lb

## 2022-01-01 DIAGNOSIS — I10 Essential (primary) hypertension: Secondary | ICD-10-CM

## 2022-01-01 DIAGNOSIS — G43009 Migraine without aura, not intractable, without status migrainosus: Secondary | ICD-10-CM

## 2022-01-01 DIAGNOSIS — F430 Acute stress reaction: Secondary | ICD-10-CM | POA: Diagnosis not present

## 2022-01-01 DIAGNOSIS — F418 Other specified anxiety disorders: Secondary | ICD-10-CM

## 2022-01-01 DIAGNOSIS — G4709 Other insomnia: Secondary | ICD-10-CM

## 2022-01-01 NOTE — Assessment & Plan Note (Signed)
Worsened with menopause  ?Recently had to stop lexapro 20 mg daily and trazodone 50 mg daily due to insurance problem  ?Tearful today/ trouble coping with stressors and with headache since stopping them  ?No SI  ?Recommended she re start- has ability to pick up meds now ?Recommend f/u if no improvement in mood or head pain  ?She had weight gain with paxil in the past, suspect these will be more weigh neutral for her  ?

## 2022-01-01 NOTE — Progress Notes (Signed)
? ?Subjective:  ? ? Patient ID: Alexa Anderson, female    DOB: 1971-08-15, 51 y.o.   MRN: [id number] ? ?This visit occurred during the SARS-CoV-2 public health emergency.  Safety protocols were in place, including screening questions prior to the visit, additional usage of staff PPE, and extensive cleaning of exam room while observing appropriate contact time as indicated for disinfecting solutions.  ? ?HPI ?Pt presents with headache and ear pain  ? ?Wt Readings from Last 3 Encounters:  ?01/01/22 278 lb 4 oz (126.2 kg)  ?12/16/21 275 lb (124.7 kg)  ?12/02/21 275 lb (124.7 kg)  ? ?42.31 kg/m? ? ?Had an insurance problem was out of some of her medicines  ?Still on bp medication  ? ?Headache ?Inner ear?   Tinnitis is worse  ?Not dizzy  ?Constant pressure top of her head  ? ?Allergy season-not too bad yet  ?Ears do not pop but feels pressure  ? ?Off singulair, still on symbicort  ?Off lexapro  ?Off trazodone  ? ?Was on mounjaro for weight loss  ? ? ?BP Readings from Last 3 Encounters:  ?01/01/22 128/84  ?12/16/21 137/83  ?12/02/21 132/85  ? ? ?Propranolol 20 mg tid ?Amlodipine 5 mg daily  ?Has not missed doses  ? ? ?Patient Active Problem List  ? Diagnosis Date Noted  ? Colon cancer screening 05/09/2021  ? Elevated glucose level 03/12/2020  ? Enlarged uterus 02/19/2020  ? Enlarged ovary 02/19/2020  ? Leg heaviness 04/05/2019  ? Amenorrhea 12/12/2018  ? Tinnitus 10/24/2018  ? Encounter for screening mammogram for breast cancer 05/12/2016  ? Routine general medical examination at a health care facility 09/05/2013  ? Encounter for routine gynecological examination 09/05/2013  ? Stress reaction 09/05/2013  ? Screen for STD (sexually transmitted disease) 03/23/2012  ? Goiter 06/05/2008  ? Morbid obesity ([place]) 06/04/2008  ? Depression with anxiety 06/04/2008  ? Migraine without aura 06/04/2008  ? Essential hypertension 06/04/2008  ? ?Past Medical History:  ?Diagnosis Date  ? Anxiety   ? Depression   ? Goiter   ? History of  depression   ? Hypertension   ? Migraines   ? Obesity   ? Weight loss   ? use to wiegh over 400 lbs  ? ?Past Surgical History:  ?Procedure Laterality Date  ? CESAREAN SECTION    ? thyroid ultrasound  02/12  ? stable 5 mm nodule hypoechoic on left  ? WISDOM TOOTH EXTRACTION    ? ?Social History  ? ?Tobacco Use  ? Smoking status: Never  ? Smokeless tobacco: Never  ?Vaping Use  ? Vaping Use: Never used  ?Substance Use Topics  ? Alcohol use: Yes  ?  Comment: Occasional  ? Drug use: No  ? ?Family History  ?Problem Relation Age of Onset  ? Obesity Mother   ? Cancer Father   ?     renal cell  ? Parkinsonism Father   ? ?Allergies  ?Allergen Reactions  ? Bee Venom Anaphylaxis  ? ?Current Outpatient Medications on File Prior to Visit  ?Medication Sig Dispense Refill  ? albuterol (VENTOLIN HFA) 108 (90 Base) MCG/ACT inhaler Inhale 1-2 puffs into the lungs every 6 (six) hours as needed for wheezing or shortness of breath. 8.5 g 0  ? amLODipine (NORVASC) 5 MG tablet TAKE 1 TABLET (5 MG) BY MOUTH ONCE DAILY. 90 tablet 3  ? bacitracin 500 UNIT/GM ointment Apply 1 application. topically 2 (two) times daily. 15 g 0  ? budesonide-formoterol (SYMBICORT) 160-4.5 MCG/ACT  inhaler Inhale 2 puffs into the lungs 2 (two) times daily. 10.2 g 3  ? escitalopram (LEXAPRO) 20 MG tablet Take 1 tablet (20 mg total) by mouth daily. 90 tablet 0  ? montelukast (SINGULAIR) 10 MG tablet TAKE 1 TABLET BY MOUTH EVERYDAY AT BEDTIME 90 tablet 3  ? phentermine (ADIPEX-P) 37.5 MG tablet Take 1 tablet (37.5 mg total) by mouth daily before breakfast. 90 tablet 0  ? tirzepatide (MOUNJARO) 7.5 MG/0.5ML Pen Inject 7.5 mg into the skin once a week. 2 mL 0  ? traZODone (DESYREL) 50 MG tablet Take 1 tablet (50 mg total) by mouth at bedtime as needed for sleep. 30 tablet 0  ? propranolol (INDERAL) 20 MG tablet Take 1 tablet (20 mg total) by mouth 3 (three) times daily. 90 tablet 2  ? ?No current facility-administered medications on file prior to visit.  ?  ?Review of  Systems  ?Constitutional:  Negative for activity change, appetite change, fatigue, fever and unexpected weight change.  ?HENT:  Positive for tinnitus. Negative for congestion, ear pain, rhinorrhea, sinus pressure and sore throat.   ?Eyes:  Negative for pain, redness and visual disturbance.  ?Respiratory:  Negative for cough, shortness of breath and wheezing.   ?Cardiovascular:  Negative for chest pain and palpitations.  ?Gastrointestinal:  Negative for abdominal pain, blood in stool, constipation and diarrhea.  ?Endocrine: Negative for polydipsia and polyuria.  ?Genitourinary:  Negative for dysuria, frequency and urgency.  ?Musculoskeletal:  Negative for arthralgias, back pain and myalgias.  ?Skin:  Negative for pallor and rash.  ?Allergic/Immunologic: Negative for environmental allergies.  ?Neurological:  Positive for headaches. Negative for dizziness and syncope.  ?Hematological:  Negative for adenopathy. Does not bruise/bleed easily.  ?Psychiatric/Behavioral:  Positive for dysphoric mood. Negative for decreased concentration. The patient is nervous/anxious.   ? ?   ?Objective:  ? Physical Exam ?Constitutional:   ?   General: She is not in acute distress. ?   Appearance: She is well-developed. She is obese.  ?HENT:  ?   Head: Normocephalic and atraumatic.  ?Eyes:  ?   Conjunctiva/sclera: Conjunctivae normal.  ?   Pupils: Pupils are equal, round, and reactive to light.  ?   Comments: No nystagmus  ?Neck:  ?   Thyroid: No thyromegaly.  ?   Vascular: No carotid bruit or JVD.  ?   Comments: Baseline thyroid enlargement  ?Cardiovascular:  ?   Rate and Rhythm: Normal rate and regular rhythm.  ?   Heart sounds: Normal heart sounds.  ?  No gallop.  ?Pulmonary:  ?   Effort: Pulmonary effort is normal. No respiratory distress.  ?   Breath sounds: Normal breath sounds. No wheezing or rales.  ?Abdominal:  ?   General: There is no distension or abdominal bruit.  ?   Palpations: Abdomen is soft.  ?Musculoskeletal:  ?   Cervical  back: Normal range of motion and neck supple.  ?   Right lower leg: No edema.  ?   Left lower leg: No edema.  ?Lymphadenopathy:  ?   Cervical: No cervical adenopathy.  ?Skin: ?   General: Skin is warm and dry.  ?   Coloration: Skin is not pale.  ?   Findings: No rash.  ?Neurological:  ?   Mental Status: She is alert.  ?   Cranial Nerves: No cranial nerve deficit.  ?   Sensory: No sensory deficit.  ?   Motor: No weakness.  ?   Coordination: Coordination is intact.  Romberg sign negative. Coordination normal.  ?   Gait: Gait normal.  ?   Deep Tendon Reflexes: Reflexes are normal and symmetric. Reflexes normal.  ?Psychiatric:     ?   Mood and Affect: Mood is anxious and depressed. Affect is tearful.     ?   Speech: Speech normal.     ?   Behavior: Behavior normal.     ?   Thought Content: Thought content is not paranoid. Thought content does not include homicidal or suicidal ideation.     ?   Cognition and Memory: Cognition and memory normal.  ?   Comments: Tearful occasionally   ? ? ? ? ? ?   ?Assessment & Plan:  ? ?Problem List Items Addressed This Visit   ? ?  ? Cardiovascular and Mediastinum  ? Essential hypertension  ?  bp in fair control at this time  ?BP Readings from Last 1 Encounters:  ?01/01/22 128/84  ?No changes needed ?Most recent labs reviewed  ?Disc lifstyle change with low sodium diet and exercise  ?Plan to continue ?Propanolol 20 mg tid and amlodipine 5 mg daily ?  ?  ? Migraine without aura  ?  Current headache is more mild and may be product of stopping her depression meds and singulair (in allergy season) ?Plans to re start ?Reassuring exam today  ?ER precautions reviewed  ?Update if not starting to improve in a week or if worsening   ? ?  ?  ?  ? Other  ? Depression with anxiety - Primary  ?  Worsened with menopause  ?Recently had to stop lexapro 20 mg daily and trazodone 50 mg daily due to insurance problem  ?Tearful today/ trouble coping with stressors and with headache since stopping them  ?No SI   ?Recommended she re start- has ability to pick up meds now ?Recommend f/u if no improvement in mood or head pain  ?She had weight gain with paxil in the past, suspect these will be more weigh neutral

## 2022-01-01 NOTE — Assessment & Plan Note (Signed)
Commended on [facility]  ?Recommend she re start the mounjaro and phentermine (if helpful)  ?Will continue clinic f/u ?Encouraged good diet and self care ?

## 2022-01-01 NOTE — Assessment & Plan Note (Signed)
Advised to get back on lexapro and trazadone  ? ?

## 2022-01-01 NOTE — Patient Instructions (Addendum)
I think your headache/ears symptoms are multi factorial  ? ?Allergies no doubt add to it along with stress  ?Coming off medications also  ? ?Go ahead and re start your medications ?Blood pressure is ok  ?Great job with weight loss!   Give it time  ? ?Take care of yourself   ? ?Stay active  ? ? ?Update if not starting to improve in 1-2 weeks or if worsening   ? ? ? ? ? ?

## 2022-01-01 NOTE — Assessment & Plan Note (Signed)
bp in fair control at this time  ?BP Readings from Last 1 Encounters:  ?01/01/22 128/84  ? ?No changes needed ?Most recent labs reviewed  ?Disc lifstyle change with low sodium diet and exercise  ?Plan to continue ?Propanolol 20 mg tid and amlodipine 5 mg daily ?

## 2022-01-01 NOTE — Assessment & Plan Note (Signed)
Current headache is more mild and may be product of stopping her depression meds and singulair (in allergy season) ?Plans to re start ?Reassuring exam today  ?ER precautions reviewed  ?Update if not starting to improve in a week or if worsening   ? ?

## 2022-01-02 ENCOUNTER — Other Ambulatory Visit (HOSPITAL_BASED_OUTPATIENT_CLINIC_OR_DEPARTMENT_OTHER): Payer: Self-pay

## 2022-01-08 IMAGING — US US PELVIS COMPLETE WITH TRANSVAGINAL
1 series · 13 of 25 positions shown · non-contrast
Comparison: None

CLINICAL DATA: Amenorrhea, perimenopausal, assess endometrium for
hyperplasia

EXAM:
TRANSABDOMINAL AND TRANSVAGINAL ULTRASOUND OF PELVIS
TECHNIQUE: Both transabdominal and transvaginal ultrasound examinations of the
pelvis were performed. Transabdominal technique was performed for
global imaging of the pelvis including uterus, ovaries, adnexal
regions, and pelvic cul-de-sac. It was necessary to proceed with
endovaginal exam following the transabdominal exam to visualize the
endometrium and ovaries.

[Series 1: us pelvis complete with transvaginal · 0.30mm/px · 13 of 111 slices shown]
[im 1/111]
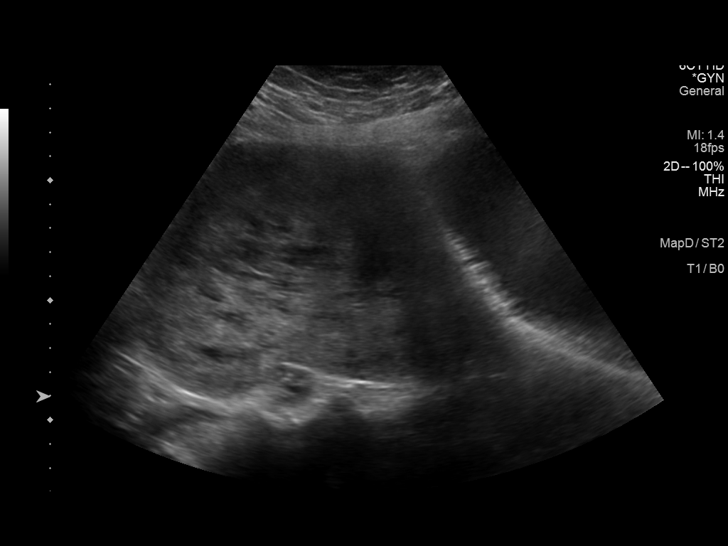
[im 10/111]
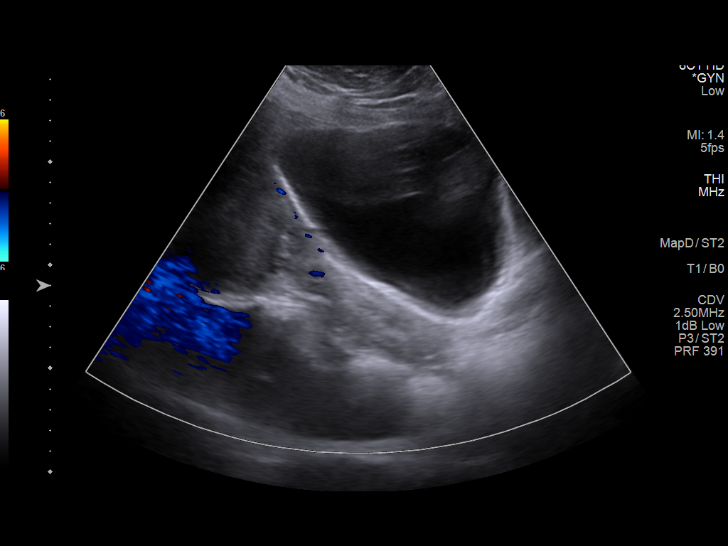
[im 19/111]
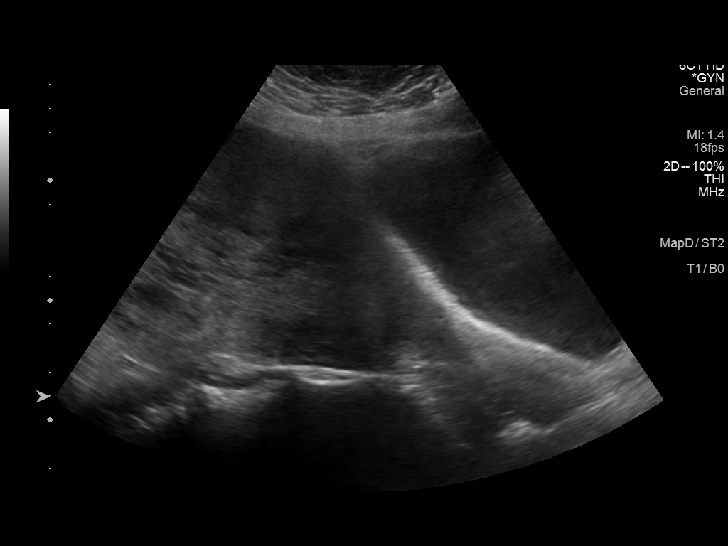
[im 28/111]
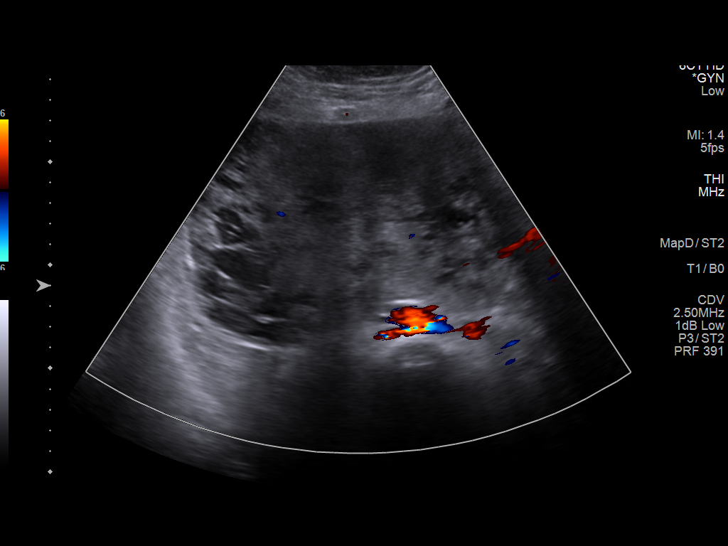
[im 37/111]
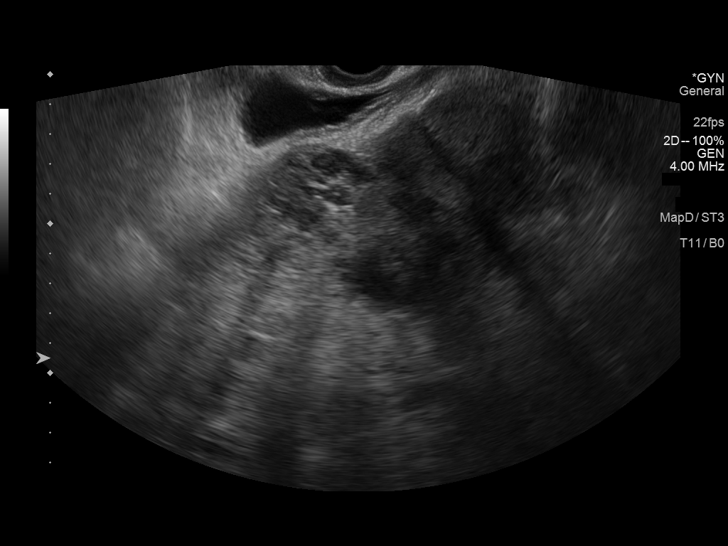
[im 46/111]
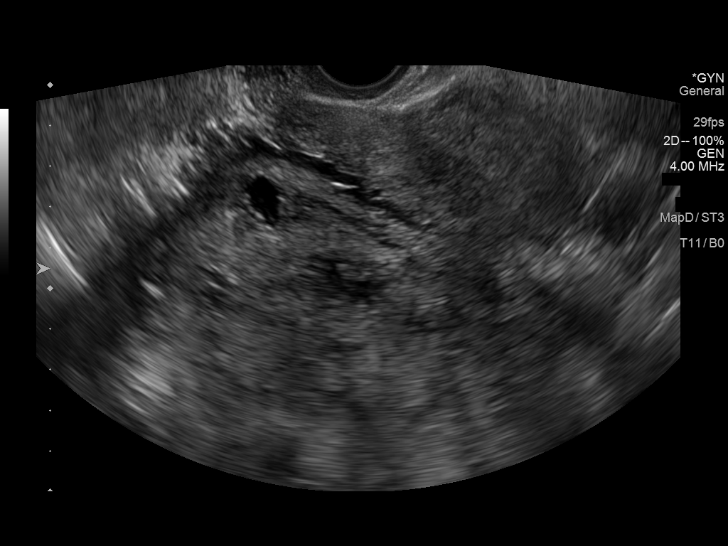
[im 56/111]
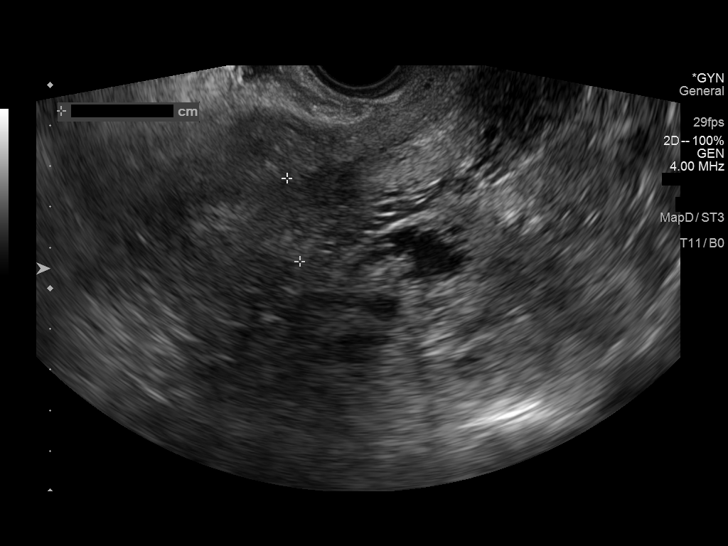
[im 65/111]
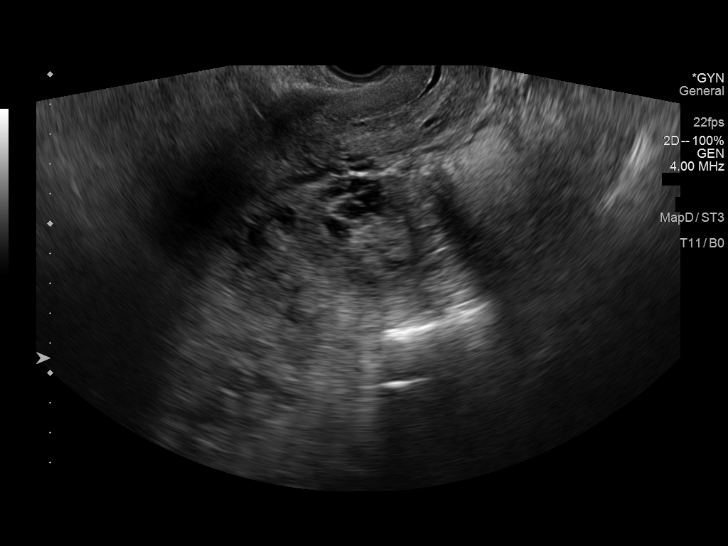
[im 74/111]
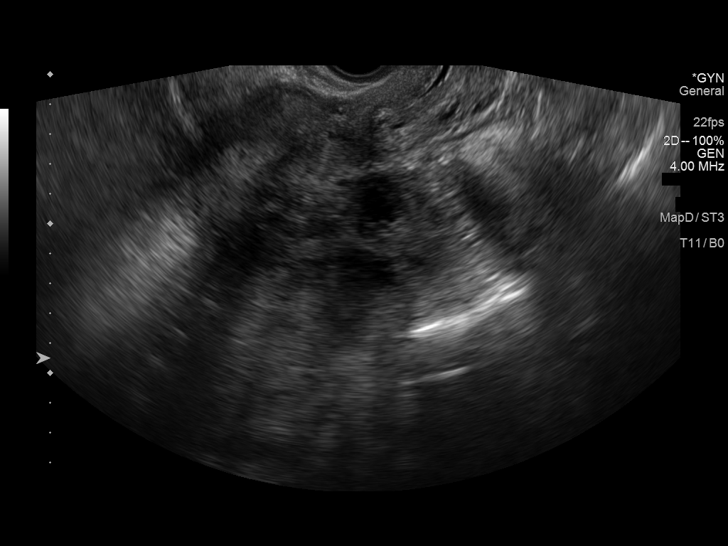
[im 83/111]
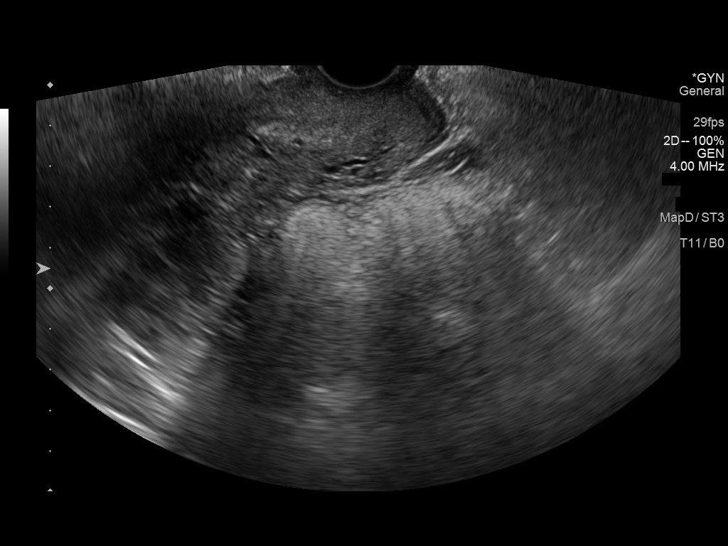
[im 92/111]
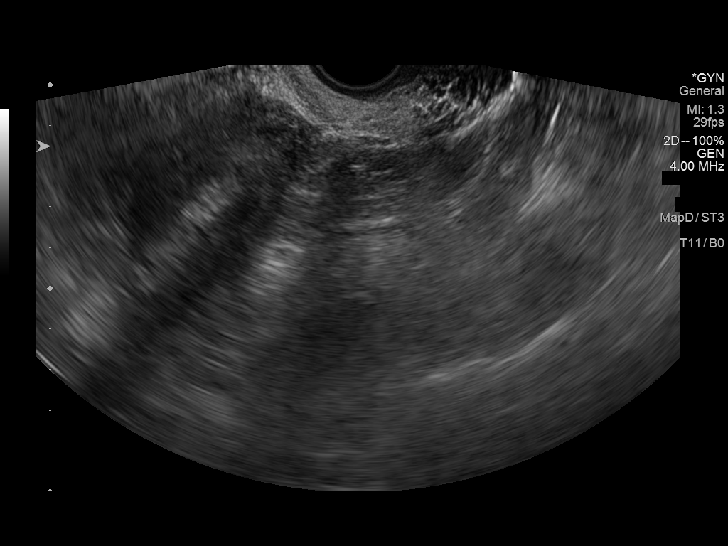
[im 101/111]
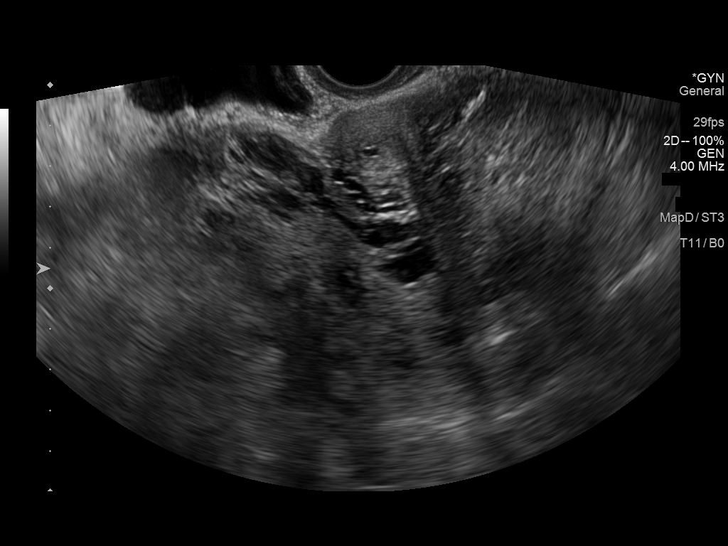
[im 111/111]
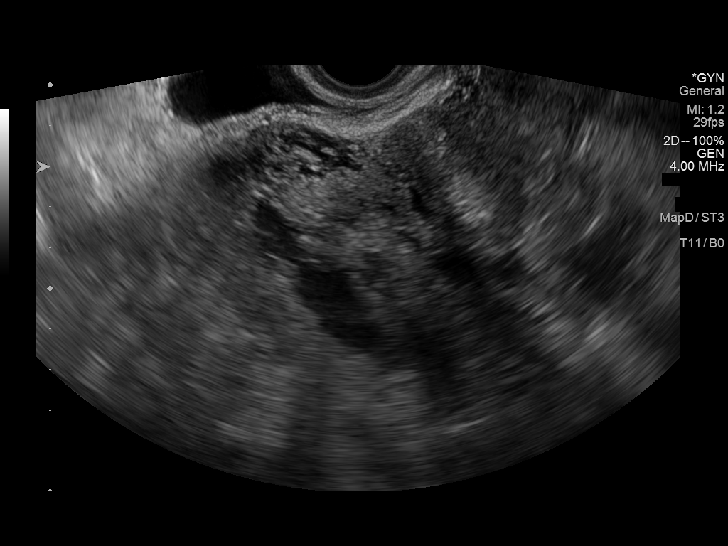

[13 of 25 positions shown; findings below may reference images not displayed]

FINDINGS: Uterus

Measurements: 22.2 x 10.2 x 16.6 cm = volume: 4433 mL. Anteverted.
Markedly enlarged and diffusely heterogeneous uterus. No discrete
mass is visualized.

Endometrium

Unable to adequately delineate endometrial complex. This may be
markedly thickened and heterogeneous but impossible to identify
endometrial borders.

Right ovary

Measurements: Questionably visualized 7.4 x 4.0 x 5.2 cm = volume:
80 mL. No obvious mass.

Left ovary

Measurements: 4.7 x 3.3 x 3.3 cm = volume: 27 mL. Normal morphology
without mass

Other findings

No free pelvic fluid.  No definite masses.
IMPRESSION: Markedly enlarged and heterogeneous uterus with cystic and solid
areas as well as diffusely heterogeneous soft tissue echogenicity.

Unable to clearly identify endometrial borders or discrete masses.

Questionable visualization of an enlarged RIGHT ovary.

Unable to exclude uterine mass or endometrial neoplasm; further
evaluation by MR imaging of the pelvis with and without contrast
recommended.

## 2022-01-13 ENCOUNTER — Ambulatory Visit (INDEPENDENT_AMBULATORY_CARE_PROVIDER_SITE_OTHER): Payer: 59 | Admitting: Family Medicine

## 2022-01-16 ENCOUNTER — Other Ambulatory Visit (HOSPITAL_BASED_OUTPATIENT_CLINIC_OR_DEPARTMENT_OTHER): Payer: Self-pay

## 2022-01-20 ENCOUNTER — Encounter (INDEPENDENT_AMBULATORY_CARE_PROVIDER_SITE_OTHER): Payer: Self-pay | Admitting: Family Medicine

## 2022-01-20 ENCOUNTER — Ambulatory Visit (INDEPENDENT_AMBULATORY_CARE_PROVIDER_SITE_OTHER): Payer: BC Managed Care – PPO | Admitting: Family Medicine

## 2022-01-20 VITALS — BP 152/87 | HR 65 | Temp 98.0°F | Ht 68.0 in | Wt 274.0 lb

## 2022-01-20 DIAGNOSIS — J45909 Unspecified asthma, uncomplicated: Secondary | ICD-10-CM | POA: Diagnosis not present

## 2022-01-20 DIAGNOSIS — F39 Unspecified mood [affective] disorder: Secondary | ICD-10-CM

## 2022-01-20 DIAGNOSIS — I10 Essential (primary) hypertension: Secondary | ICD-10-CM | POA: Diagnosis not present

## 2022-01-20 DIAGNOSIS — E669 Obesity, unspecified: Secondary | ICD-10-CM | POA: Diagnosis not present

## 2022-01-20 DIAGNOSIS — F411 Generalized anxiety disorder: Secondary | ICD-10-CM

## 2022-01-20 DIAGNOSIS — Z6841 Body Mass Index (BMI) 40.0 and over, adult: Secondary | ICD-10-CM

## 2022-01-20 DIAGNOSIS — Z9189 Other specified personal risk factors, not elsewhere classified: Secondary | ICD-10-CM

## 2022-01-20 DIAGNOSIS — R062 Wheezing: Secondary | ICD-10-CM

## 2022-01-26 ENCOUNTER — Telehealth: Payer: Self-pay | Admitting: Family Medicine

## 2022-01-26 NOTE — Telephone Encounter (Signed)
Pt would like discuss/clarification on meds. Please advise.  ?

## 2022-01-27 ENCOUNTER — Other Ambulatory Visit (HOSPITAL_BASED_OUTPATIENT_CLINIC_OR_DEPARTMENT_OTHER): Payer: Self-pay

## 2022-01-27 NOTE — Telephone Encounter (Signed)
I can refill everything but the phentermine (I don't px that medication).  ?    ? We can postpone follow up until summer when she can take a day off. (I would eventually like to sit down with her and review meds and her.  I don't have the know how they do re: weight loss but will do the best I can.  They were seeing her much more frequently for accountability as well.  ? ? ? ? ? ?

## 2022-01-27 NOTE — Telephone Encounter (Addendum)
Called pt and she is has been [facility] and seeing Dr. Juleen China since June 2021. Dr. Juleen China has left the practice and she couldn't follow her because Dr. Alcario Drought new office doesn't accept her insurance. Pt recently saw another provider there [facility] and pt said it wasn't a good visit and she doesn't know what to do. The new provider told her that she will not refill any of her meds that don't have to do with weight loss and her PCP needs to be following her regarding these meds and checking her regarding these meds and not them. Pt said she now doesn't know what to do because she is almost out of all of her meds and she can't get them filled by them. ? ?Pt did ask if PCP can take over meds she does have the [facility] but is about be out of all of her chronic medications. Pt did schedule an appt on 02/02/22 if pt is requiring an appt to discuss this but she really doesn't want to take off of work since she just saw PCP on 01/01/22 ? ?[place]  ?

## 2022-01-28 ENCOUNTER — Ambulatory Visit: Payer: Self-pay | Admitting: Family Medicine

## 2022-01-29 ENCOUNTER — Ambulatory Visit: Payer: Self-pay | Admitting: Family Medicine

## 2022-02-02 ENCOUNTER — Other Ambulatory Visit (HOSPITAL_BASED_OUTPATIENT_CLINIC_OR_DEPARTMENT_OTHER): Payer: Self-pay

## 2022-02-02 ENCOUNTER — Encounter: Payer: Self-pay | Admitting: Family Medicine

## 2022-02-02 ENCOUNTER — Ambulatory Visit (INDEPENDENT_AMBULATORY_CARE_PROVIDER_SITE_OTHER): Payer: BLUE CROSS/BLUE SHIELD | Admitting: Family Medicine

## 2022-02-02 VITALS — BP 128/84 | HR 80 | Ht 68.0 in | Wt 277.8 lb

## 2022-02-02 DIAGNOSIS — J302 Other seasonal allergic rhinitis: Secondary | ICD-10-CM | POA: Diagnosis not present

## 2022-02-02 DIAGNOSIS — I10 Essential (primary) hypertension: Secondary | ICD-10-CM

## 2022-02-02 DIAGNOSIS — F411 Generalized anxiety disorder: Secondary | ICD-10-CM

## 2022-02-02 DIAGNOSIS — G4709 Other insomnia: Secondary | ICD-10-CM

## 2022-02-02 DIAGNOSIS — J4540 Moderate persistent asthma, uncomplicated: Secondary | ICD-10-CM

## 2022-02-02 DIAGNOSIS — F418 Other specified anxiety disorders: Secondary | ICD-10-CM

## 2022-02-02 DIAGNOSIS — J45909 Unspecified asthma, uncomplicated: Secondary | ICD-10-CM | POA: Insufficient documentation

## 2022-02-02 DIAGNOSIS — R062 Wheezing: Secondary | ICD-10-CM

## 2022-02-02 DIAGNOSIS — G43009 Migraine without aura, not intractable, without status migrainosus: Secondary | ICD-10-CM

## 2022-02-02 DIAGNOSIS — J4520 Mild intermittent asthma, uncomplicated: Secondary | ICD-10-CM

## 2022-02-02 MED ORDER — MONTELUKAST SODIUM 10 MG PO TABS
ORAL_TABLET | ORAL | 3 refills | Status: DC
  Filled 2022-02-02: qty 30, 30d supply, fill #0
  Filled 2022-03-05: qty 30, 30d supply, fill #1
  Filled 2022-05-18: qty 30, 30d supply, fill #2
  Filled 2022-06-25: qty 30, 30d supply, fill #3
  Filled 2022-11-23: qty 30, 30d supply, fill #4

## 2022-02-02 MED ORDER — PROPRANOLOL HCL 20 MG PO TABS
20.0000 mg | ORAL_TABLET | Freq: Three times a day (TID) | ORAL | 3 refills | Status: DC
  Filled 2022-02-02: qty 90, 30d supply, fill #0
  Filled 2022-03-10 – 2022-05-18 (×2): qty 90, 30d supply, fill #1

## 2022-02-02 MED ORDER — BUDESONIDE-FORMOTEROL FUMARATE 160-4.5 MCG/ACT IN AERO
2.0000 | INHALATION_SPRAY | Freq: Two times a day (BID) | RESPIRATORY_TRACT | 3 refills | Status: DC
  Filled 2022-02-02: qty 10.2, 30d supply, fill #0
  Filled 2022-03-10 – 2022-05-18 (×2): qty 10.2, 30d supply, fill #1

## 2022-02-02 MED ORDER — ALBUTEROL SULFATE HFA 108 (90 BASE) MCG/ACT IN AERS
1.0000 | INHALATION_SPRAY | Freq: Four times a day (QID) | RESPIRATORY_TRACT | 3 refills | Status: DC | PRN
  Filled 2022-02-02: qty 8.5, 28d supply, fill #0

## 2022-02-02 MED ORDER — TRAZODONE HCL 50 MG PO TABS
50.0000 mg | ORAL_TABLET | Freq: Every evening | ORAL | 3 refills | Status: DC | PRN
  Filled 2022-02-02: qty 30, 30d supply, fill #0
  Filled 2022-05-18: qty 30, 30d supply, fill #1

## 2022-02-02 MED ORDER — ESCITALOPRAM OXALATE 20 MG PO TABS
20.0000 mg | ORAL_TABLET | Freq: Every day | ORAL | 0 refills | Status: DC
  Filled 2022-02-02: qty 30, 30d supply, fill #0
  Filled 2022-03-05: qty 30, 30d supply, fill #1
  Filled 2022-04-16: qty 30, 30d supply, fill #2

## 2022-02-02 MED ORDER — AMLODIPINE BESYLATE 5 MG PO TABS
5.0000 mg | ORAL_TABLET | Freq: Every day | ORAL | 3 refills | Status: DC
  Filled 2022-02-02: qty 30, 30d supply, fill #0
  Filled 2022-03-05: qty 30, 30d supply, fill #1
  Filled 2022-04-16: qty 30, 30d supply, fill #2
  Filled 2022-05-18: qty 30, 30d supply, fill #3
  Filled 2022-06-25: qty 30, 30d supply, fill #4
  Filled 2022-08-06: qty 90, 90d supply, fill #5
  Filled 2022-11-20: qty 30, 30d supply, fill #6
  Filled 2023-01-04: qty 30, 30d supply, fill #7

## 2022-02-02 NOTE — Assessment & Plan Note (Signed)
Discussed how this problem influences overall health and the risks it imposes  ?Reviewed plan for weight loss with lower calorie diet (via better food choices and also portion control or program like weight watchers) and exercise building up to or more than 30 minutes 5 days per week including some aerobic activity  ? ?[facility] ?She wants to continue mounjaro (the pros seem to outweight the cons)  ?Also pherntermine  ? ?She continues the waitstaff job for exercise / it seems good for her and she enjoys it  ?

## 2022-02-02 NOTE — Patient Instructions (Signed)
Continue current medicines  ? ?Use miralax for constipation  ?Eat your fruits and vegetables ?Stay active  ? ?Get outdoors  ?Take care of yourself  ? ? ?

## 2022-02-02 NOTE — Assessment & Plan Note (Signed)
bp in fair control at this time  ?BP Readings from Last 1 Encounters:  ?02/02/22 128/84  ? ?No changes needed ?Most recent labs reviewed  ?Disc lifstyle change with low sodium diet and exercise  ?Taking over med management from Dr Juleen China ?Propranolol 20 mg tid ?Amlodipine 5 mg daily ?

## 2022-02-02 NOTE — Assessment & Plan Note (Signed)
Mild /worse with illnesses ? ?Good control with symbicort 160-4.5 mg bid ?Albuterol prn ?singulair 10 mg daily ?

## 2022-02-02 NOTE — Telephone Encounter (Signed)
Pt scheduled an appt and discuss this directly with PCP at [place] [facility] today ?

## 2022-02-02 NOTE — Assessment & Plan Note (Signed)
Much improvement back on lexapro 20 mg daily  ?Trazodone prn sleep  ? ?Reviewed stressors/ coping techniques/symptoms/ support sources/ tx options and side effects in detail today ? ?

## 2022-02-02 NOTE — Progress Notes (Signed)
? ?Subjective:  ? ? Patient ID: Alexa Anderson, female    DOB: 05-Jun-1971, 51 y.o.   MRN: [id number] ? ?HPI ?Pt presents for f/u of dep/anxiety and also chronic medical problems incl obesity  ? ?Wt Readings from Last 3 Encounters:  ?02/02/22 277 lb 12.8 oz (126 kg)  ?01/20/22 274 lb (124.3 kg)  ?01/01/22 278 lb 4 oz (126.2 kg)  ? ?42.24 kg/m? ? ?Eating more protein  ?Lots of water and unsweet tea  ? ?Exercise- working in [employer] in addn to regular job  ?Son is upset that she is working too much  ?Knows this  ?No day off  ?Likes her jobs  ? ? ?Last visit pt had to stop lexapro and trazodone due to insurance issues short term ?This caused worse mood and a headache  ? ?Uses trazodone prn  ? ? ?Better now back on her medications  ? ? ?Asthma  ?Symbicort 160-4.5 bid  ?Uses albuterol prn/not often (needed it when she had covid) ?Singulair 10 mg daily  ? ?HTN  ? ?bp is stable today  ?No cp or palpitations or headaches or edema  ?No side effects to medicines  ?BP Readings from Last 3 Encounters:  ?02/02/22 128/84  ?01/20/22 (!) 152/87  ?01/01/22 128/84  ?   ?Propranolol 20 mg tid  ?And amlodipine 5 mg daily  ? ? ? ? ?Her practitioner left the healthy weight clinic   (could not follow her to [facility])  ?Was taking monjaro and phentermine  ? ?Mounjaro did help her feel full  ?Is back to loosing  ? ?BP Readings from Last 3 Encounters:  ?02/02/22 128/84  ?01/20/22 (!) 152/87  ?01/01/22 128/84  ? ?Pulse Readings from Last 3 Encounters:  ?02/02/22 80  ?01/20/22 65  ?01/01/22 76  ? ?Bp is well controlled ?Lab Results  ?Component Value Date  ? HGBA1C 5.1 05/09/2021  ? ? ?Patient Active Problem List  ? Diagnosis Date Noted  ? Asthma 02/02/2022  ? Colon cancer screening 05/09/2021  ? Elevated glucose level 03/12/2020  ? Enlarged uterus 02/19/2020  ? Enlarged ovary 02/19/2020  ? Leg heaviness 04/05/2019  ? Amenorrhea 12/12/2018  ? Tinnitus 10/24/2018  ? Encounter for screening mammogram for breast cancer 05/12/2016  ? Routine general  medical examination at a health care facility 09/05/2013  ? Encounter for routine gynecological examination 09/05/2013  ? Stress reaction 09/05/2013  ? Screen for STD (sexually transmitted disease) 03/23/2012  ? Goiter 06/05/2008  ? Morbid obesity ([place]) 06/04/2008  ? Depression with anxiety 06/04/2008  ? Migraine without aura 06/04/2008  ? Essential hypertension 06/04/2008  ? ?Past Medical History:  ?Diagnosis Date  ? Anxiety   ? Depression   ? Goiter   ? History of depression   ? Hypertension   ? Migraines   ? Obesity   ? Weight loss   ? use to wiegh over 400 lbs  ? ?Past Surgical History:  ?Procedure Laterality Date  ? CESAREAN SECTION    ? thyroid ultrasound  02/12  ? stable 5 mm nodule hypoechoic on left  ? WISDOM TOOTH EXTRACTION    ? ?Social History  ? ?Tobacco Use  ? Smoking status: Never  ? Smokeless tobacco: Never  ?Vaping Use  ? Vaping Use: Never used  ?Substance Use Topics  ? Alcohol use: Yes  ?  Comment: Occasional  ? Drug use: No  ? ?Family History  ?Problem Relation Age of Onset  ? Obesity Mother   ? Cancer Father   ?  renal cell  ? Parkinsonism Father   ? ?Allergies  ?Allergen Reactions  ? Bee Venom Anaphylaxis  ? ?Current Outpatient Medications on File Prior to Visit  ?Medication Sig Dispense Refill  ? phentermine (ADIPEX-P) 37.5 MG tablet Take 1 tablet (37.5 mg total) by mouth daily before breakfast. 90 tablet 0  ? tirzepatide (MOUNJARO) 7.5 MG/0.5ML Pen Inject 7.5 mg into the skin once a week. 2 mL 0  ? ?No current facility-administered medications on file prior to visit.  ?  ? ?Review of Systems  ?Constitutional:  Positive for fatigue. Negative for activity change, appetite change, fever and unexpected weight change.  ?HENT:  Negative for congestion, ear pain, rhinorrhea, sinus pressure and sore throat.   ?Eyes:  Negative for pain, redness and visual disturbance.  ?Respiratory:  Negative for cough, shortness of breath and wheezing.   ?Cardiovascular:  Negative for chest pain and palpitations.   ?Gastrointestinal:  Negative for abdominal pain, blood in stool, constipation and diarrhea.  ?Endocrine: Negative for polydipsia and polyuria.  ?Genitourinary:  Negative for dysuria, frequency and urgency.  ?Musculoskeletal:  Negative for arthralgias, back pain and myalgias.  ?Skin:  Negative for pallor and rash.  ?Allergic/Immunologic: Negative for environmental allergies.  ?Neurological:  Negative for dizziness, syncope and headaches.  ?Hematological:  Negative for adenopathy. Does not bruise/bleed easily.  ?Psychiatric/Behavioral:  Negative for decreased concentration and dysphoric mood. The patient is not nervous/anxious.   ?     Mood is better  ? ?   ?Objective:  ? Physical Exam ?Constitutional:   ?   General: She is not in acute distress. ?   Appearance: Normal appearance. She is well-developed. She is obese. She is not ill-appearing or diaphoretic.  ?HENT:  ?   Head: Normocephalic and atraumatic.  ?Eyes:  ?   Conjunctiva/sclera: Conjunctivae normal.  ?   Pupils: Pupils are equal, round, and reactive to light.  ?Neck:  ?   Thyroid: No thyromegaly.  ?   Vascular: No carotid bruit or JVD.  ?Cardiovascular:  ?   Rate and Rhythm: Normal rate and regular rhythm.  ?   Heart sounds: Normal heart sounds.  ?  No gallop.  ?Pulmonary:  ?   Effort: Pulmonary effort is normal. No respiratory distress.  ?   Breath sounds: Normal breath sounds. No stridor. No wheezing, rhonchi or rales.  ?   Comments: No wheeze even with forced exp ?Abdominal:  ?   General: There is no distension or abdominal bruit.  ?   Palpations: Abdomen is soft.  ?   Tenderness: There is no abdominal tenderness.  ?Musculoskeletal:  ?   Cervical back: Normal range of motion and neck supple.  ?   Right lower leg: No edema.  ?   Left lower leg: No edema.  ?Lymphadenopathy:  ?   Cervical: No cervical adenopathy.  ?Skin: ?   General: Skin is warm and dry.  ?   Coloration: Skin is not pale.  ?   Findings: No rash.  ?Neurological:  ?   Mental Status: She is  alert.  ?   Coordination: Coordination normal.  ?   Deep Tendon Reflexes: Reflexes are normal and symmetric. Reflexes normal.  ?Psychiatric:     ?   Mood and Affect: Mood normal.     ?   Cognition and Memory: Cognition and memory normal.  ?   Comments: Cheerful  ?Talkative   ? ? ? ? ? ?   ?Assessment & Plan:  ? ?Problem List  Items Addressed This Visit   ? ?  ? Cardiovascular and Mediastinum  ? Essential hypertension  ?  bp in fair control at this time  ?BP Readings from Last 1 Encounters:  ?02/02/22 128/84  ?No changes needed ?Most recent labs reviewed  ?Disc lifstyle change with low sodium diet and exercise  ?Taking over med management from Dr Juleen China ?Propranolol 20 mg tid ?Amlodipine 5 mg daily ?  ?  ? Relevant Medications  ? propranolol (INDERAL) 20 MG tablet  ? amLODipine (NORVASC) 5 MG tablet  ? Migraine without aura  ?  Headache is better back on regular psych and bp medicines  ? ?  ?  ? Relevant Medications  ? propranolol (INDERAL) 20 MG tablet  ? amLODipine (NORVASC) 5 MG tablet  ? escitalopram (LEXAPRO) 20 MG tablet  ? traZODone (DESYREL) 50 MG tablet  ?  ? Respiratory  ? Asthma  ?  Mild /worse with illnesses ? ?Good control with symbicort 160-4.5 mg bid ?Albuterol prn ?singulair 10 mg daily ? ?  ?  ? Relevant Medications  ? montelukast (SINGULAIR) 10 MG tablet  ? albuterol (VENTOLIN HFA) 108 (90 Base) MCG/ACT inhaler  ? budesonide-formoterol (SYMBICORT) 160-4.5 MCG/ACT inhaler  ?  ? Other  ? Depression with anxiety - Primary  ?  Much improvement back on lexapro 20 mg daily  ?Trazodone prn sleep  ? ?Reviewed stressors/ coping techniques/symptoms/ support sources/ tx options and side effects in detail today ? ? ?  ?  ? Relevant Medications  ? escitalopram (LEXAPRO) 20 MG tablet  ? traZODone (DESYREL) 50 MG tablet  ? Morbid obesity ([place])  ?  Discussed how this problem influences overall health and the risks it imposes  ?Reviewed plan for weight loss with lower calorie diet (via better food choices and also  portion control or program like weight watchers) and exercise building up to or more than 30 minutes 5 days per week including some aerobic activity  ? ?Continues care back at [facility] clinic ?She wants

## 2022-02-02 NOTE — Assessment & Plan Note (Signed)
Headache is better back on regular psych and bp medicines  ?

## 2022-02-03 ENCOUNTER — Other Ambulatory Visit (HOSPITAL_BASED_OUTPATIENT_CLINIC_OR_DEPARTMENT_OTHER): Payer: Self-pay

## 2022-02-03 NOTE — Progress Notes (Signed)
? ? ? ?Chief Complaint:  ? ?OBESITY ?Alexa Anderson is here to discuss her progress with her obesity treatment plan along with follow-up of her obesity related diagnoses. Alexa Anderson is on keeping a food journal and adhering to recommended goals of 1800 calories and 100 grams of protein daily and states she is following her eating plan approximately 50% of the time. Alexa Anderson states she is walking 11,000-12,000 steps 3-4 times per week.  ? ?Today's visit was #: 30 ?Starting weight: 290 lbs ?Starting date: 03/21/2020 ?Today's weight: 274 lbs ?Today's date: 01/20/2022 ?Total lbs lost to date: 25 ?Total lbs lost since last in-office visit: 1 ? ?Interim History: Alexa Anderson is a prior patient of Dr. Juleen China since 6/21. She has not been journaling her food intake nor monitoring her protein. I am not sure what plan she is on after talking to her. She is taking Adipex and Mounjaro, but the patient is not sure why she was started on these medications. ? ?Subjective:  ? ?1. Essential hypertension ?Alexa Anderson prior, was getting her blood pressure medications from Dr. Juleen China, but patient recently saw her PCP on 3/30/023. Notes were reviewed and we discussed today that blood pressure management will be directed by her PCP in the future. ? ?2. Reactive airway disease, unspecified asthma severity, unspecified whether persistent ?In the past, Alexa Anderson was given albuterol for "asthma". Diagnosed by Dr. Juleen China prior. She never had PFTs, nor had she seen her PCP or other specialists for. She telss me she is not sure albuterol helps at all. She denies current symptoms or concerns.  ? ?3. Mood disorder ([place]) ?Alexa Anderson had to discontinue her trazodone and Lexapro due to insurance issues. Recently restarted them both and she is doing better. ? ?4. At risk for malnutrition ?Alexa Anderson is at increased risk for malnutrition due to not currently following the meal plan or journaling. ? ?Assessment/Plan:  ?No orders of the defined types were placed in this  encounter. ? ? ?Medications Discontinued During This Encounter  ?Medication Reason  ? bacitracin 500 UNIT/GM ointment Patient Preference  ?  ? ?No orders of the defined types were placed in this encounter. ?  ? ?1. Essential hypertension ?[place] will continue Norvasc, inderal per her PCP's recommendations. Here, we will continue to focus on lifestyle changes and weight loss.  ? ?2. Reactive airway disease, unspecified asthma severity, unspecified whether persistent ?I recommended the patient be evaluated with PFTs and evaluation by her PCP or pulmonologist if she feels her symptoms resurface in the future. Symptoms are currently stable. ? ?3. Mood disorder ([place]) ?I recommended the patient to focus on diet; she will increase her activity and sleep hygiene and medication management for mood per her PCP. Again, we discussed that her PCP of 20+ years would be best to manage her chronic diseases. ? ?4. At risk for malnutrition ?Alexa Anderson was given approximately 23 minutes of counseling today regarding prevention of malnutrition and ways to meet macronutrient goals..  ? ?5. Obesity, current BMI 41.7 ?Alexa Anderson is currently in the action stage of change. As such, her goal is to continue with weight loss efforts. She has agreed to change to keeping a food journal and adhering to recommended goals of 1700-1800 calories and 120+ grams of protein daily.  ? ?Next office visit: Bring in journaling summary. Needs repeat IC and possibly labs. Patient will get back on the plan and depending on her symptoms and how she does, we can determine if [facility] and Adipex is appropriate at the time.  ? ?Exercise  goals: As is. ? ?Behavioral modification strategies: increasing lean protein intake and keeping a strict food journal. ? ?Alexa Anderson has agreed to follow-up with our clinic in 3 to 4 weeks. She was informed of the importance of frequent follow-up visits to maximize her success with intensive lifestyle modifications for her multiple health  conditions.  ? ?Objective:  ? ?Blood pressure (!) 152/87, pulse 65, temperature 98 ?F (36.7 ?C), height '5\' 8"'$  (1.727 m), weight 274 lb (124.3 kg), SpO2 99 %. ?Body mass index is 41.66 kg/m?. ? ?General: Cooperative, alert, well developed, in no acute distress. ?HEENT: Conjunctivae and lids unremarkable. ?Cardiovascular: Regular rhythm.  ?Lungs: Normal work of breathing. ?Neurologic: No focal deficits.  ? ?Lab Results  ?Component Value Date  ? CREATININE 0.60 05/09/2021  ? BUN 12 05/09/2021  ? NA 137 05/09/2021  ? K 4.2 05/09/2021  ? CL 102 05/09/2021  ? CO2 31 05/09/2021  ? ?Lab Results  ?Component Value Date  ? ALT 35 05/09/2021  ? AST 25 05/09/2021  ? ALKPHOS 104 05/09/2021  ? BILITOT 0.6 05/09/2021  ? ?Lab Results  ?Component Value Date  ? HGBA1C 5.1 05/09/2021  ? HGBA1C 5.5 01/02/2021  ? HGBA1C 5.4 03/21/2020  ? HGBA1C 5.3 09/05/2013  ? ?Lab Results  ?Component Value Date  ? INSULIN 9.2 01/02/2021  ? INSULIN 8.9 03/21/2020  ? ?Lab Results  ?Component Value Date  ? TSH 1.85 05/09/2021  ? ?Lab Results  ?Component Value Date  ? CHOL 156 05/09/2021  ? HDL 49.70 05/09/2021  ? [place] 96 05/09/2021  ? TRIG 54.0 05/09/2021  ? CHOLHDL 3 05/09/2021  ? ?Lab Results  ?Component Value Date  ? VD25OH 32.6 01/02/2021  ? VD25OH 39.8 03/21/2020  ? ?Lab Results  ?Component Value Date  ? WBC 7.5 05/09/2021  ? HGB 13.1 05/09/2021  ? HCT 39.0 05/09/2021  ? MCV 86.8 05/09/2021  ? PLT 273.0 05/09/2021  ? ?Lab Results  ?Component Value Date  ? IRON 64 01/02/2021  ? TIBC 269 01/02/2021  ? FERRITIN 37 01/02/2021  ? ?Attestation Statements:  ? ?Reviewed by clinician on day of visit: allergies, medications, problem list, medical history, surgical history, family history, social history, and previous encounter notes. ? ? ?I, Trixie Dredge, am acting as transcriptionist for [employer], DO. ? ?I have reviewed the above documentation for accuracy and completeness, and I agree with the above. Marjory Sneddon, D.O. ? ?The [place] was signed into law in 2016 which includes the topic of electronic health records.  This provides immediate access to information in MyChart.  This includes consultation notes, operative notes, office notes, lab results and pathology reports.  If you have any questions about what you read please let us know at your next visit so we can discuss your concerns and take corrective action if need be.  We are right here with you. ? ? ?

## 2022-02-04 ENCOUNTER — Other Ambulatory Visit (HOSPITAL_COMMUNITY): Payer: Self-pay

## 2022-02-05 ENCOUNTER — Other Ambulatory Visit (HOSPITAL_BASED_OUTPATIENT_CLINIC_OR_DEPARTMENT_OTHER): Payer: Self-pay

## 2022-02-17 ENCOUNTER — Ambulatory Visit (INDEPENDENT_AMBULATORY_CARE_PROVIDER_SITE_OTHER): Payer: BC Managed Care – PPO | Admitting: Nurse Practitioner

## 2022-02-18 IMAGING — MR MR PELVIS WO/W CM
8 of 11 series · 36 of 48 positions shown · IV contrast (multihance)
Comparison: Pelvic ultrasound on 02/16/2020

CLINICAL DATA: Enlarged uterus and ovarian cyst on recent
ultrasound.

EXAM:
MRI PELVIS WITHOUT AND WITH CONTRAST
TECHNIQUE: Multiplanar multisequence MR imaging of the pelvis was performed
both before and after administration of intravenous contrast.
CONTRAST:  20mL MULTIHANCE GADOBENATE DIMEGLUMINE 529 MG/ML IV SOLN

[Series 4: T2 · coronal · 5.0mm · 1.25mm/px · 3 of 30 slices shown (1 of 3)]
[im 1/30]
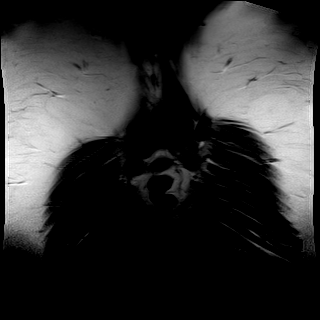
[im 15/30]
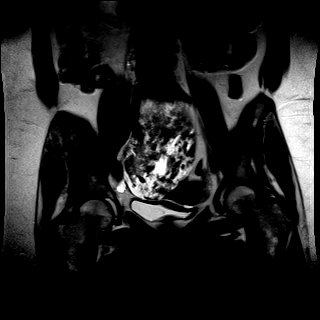
[im 30/30]
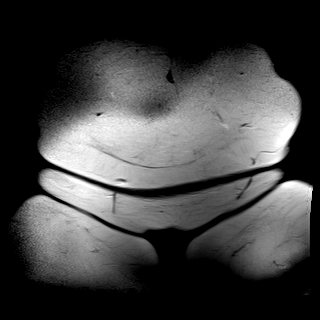

[Series 5: T2 · sagittal · 5.0mm · 0.78mm/px · 2 of 40 slices shown (2 of 3)]
[im 1/40]
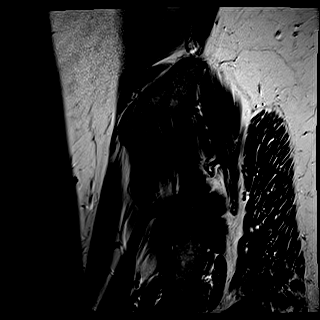
[im 40/40]
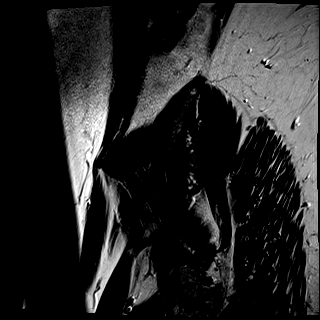

[Series 6: T2 · axial · 5.0mm · 0.41mm/px · z∈[-97,+101]mm · 2 of 34 slices shown (3 of 3)]
[im 1/34]
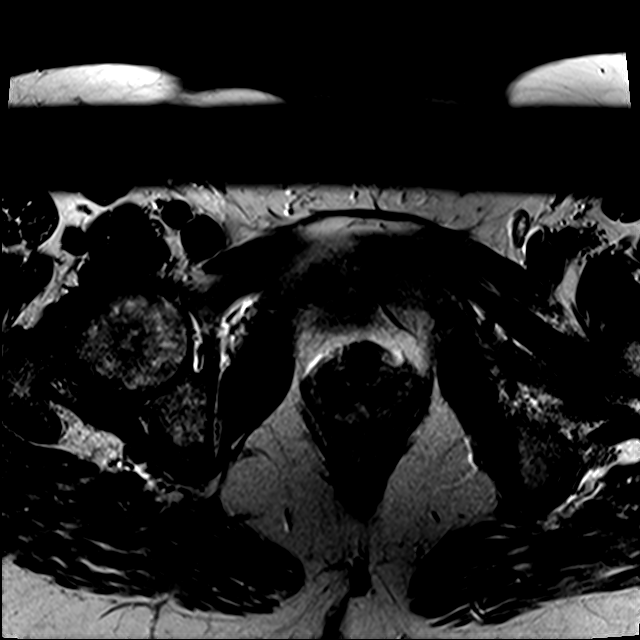
[im 34/34]
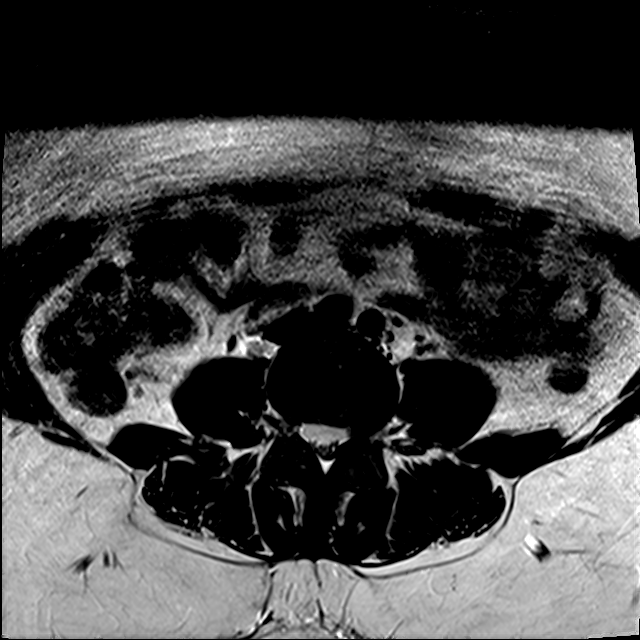

[Series 7: T2 fat-sat · axial · 5.0mm · 0.41mm/px · z∈[-97,+101]mm · 2 of 34 slices shown]
[im 1/34]
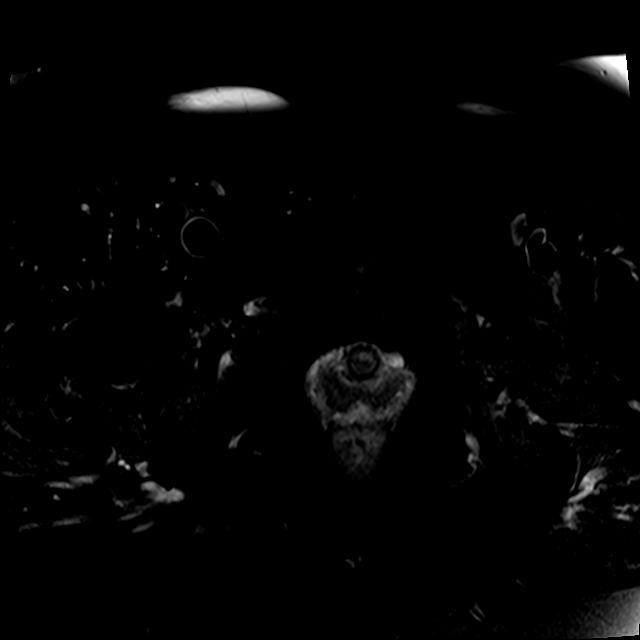
[im 34/34]
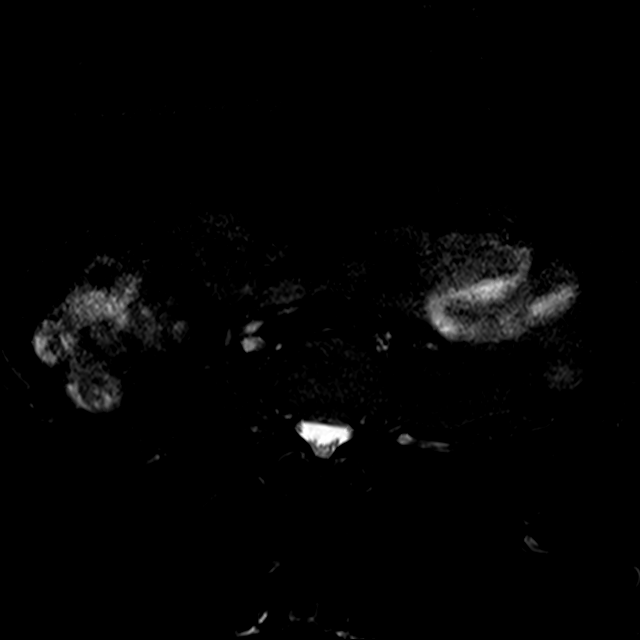

[Series 8: T1 · axial · 1.5mm · 0.75mm/px · z∈[-98,+117]mm · 7 of 144 slices shown]
[im 1/144]
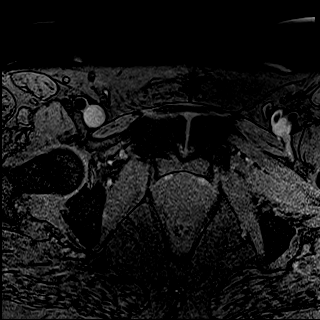
[im 24/144]
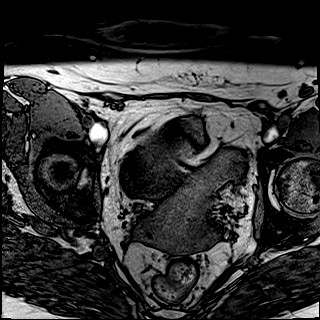
[im 48/144]
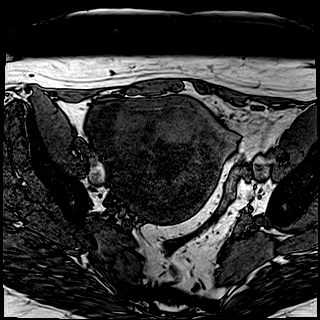
[im 72/144]
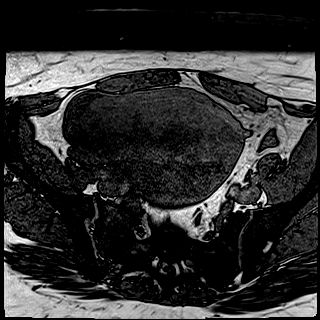
[im 96/144]
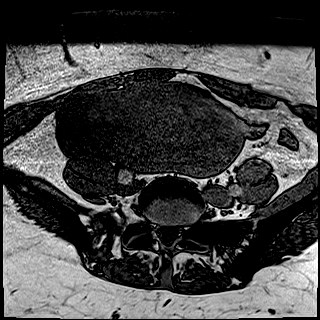
[im 120/144]
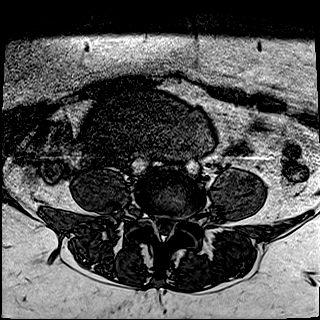
[im 144/144]
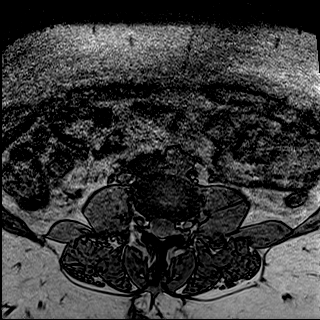

[Series 9: T1 fat-sat · axial · 1.5mm · 0.75mm/px · z∈[-105,+110]mm · 7 of 144 slices shown]
[im 1/144]
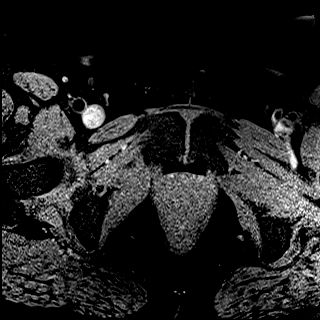
[im 24/144]
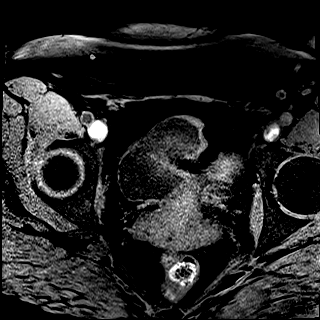
[im 48/144]
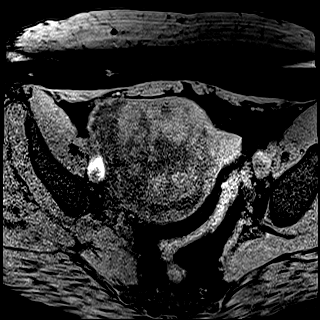
[im 72/144]
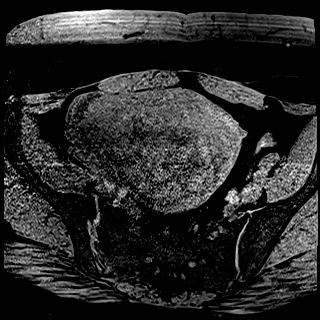
[im 96/144]
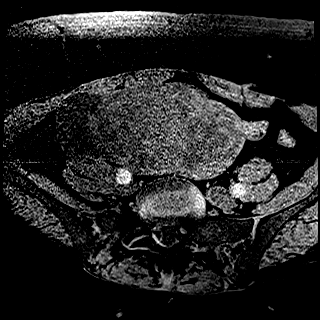
[im 120/144]
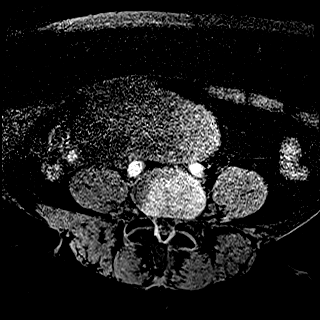
[im 144/144]
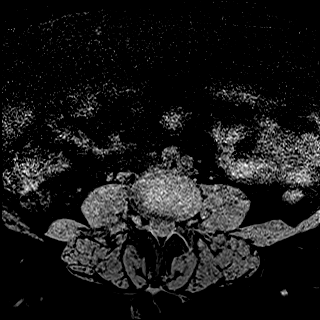

[Series 10: T1 fat-sat post-contrast · axial · 1.5mm · 0.75mm/px · z∈[-105,+110]mm · 7 of 144 slices shown (1 of 2)]
[im 1/144]
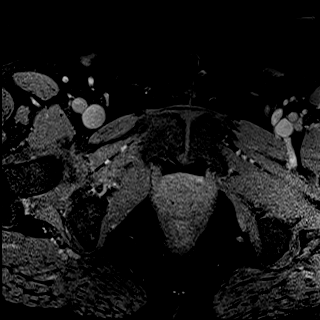
[im 24/144]
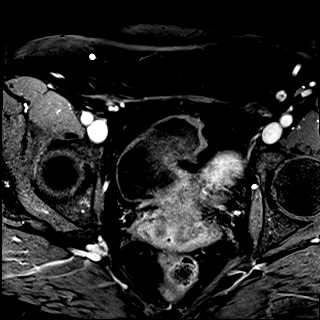
[im 48/144]
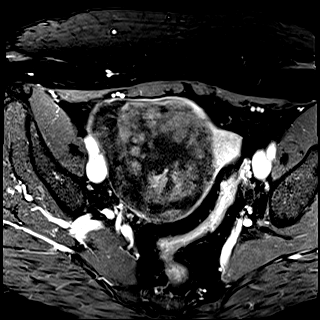
[im 72/144]
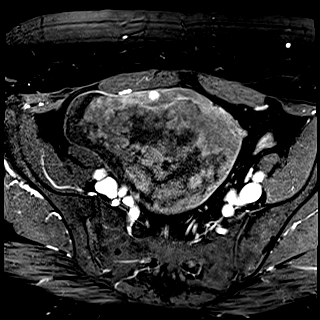
[im 96/144]
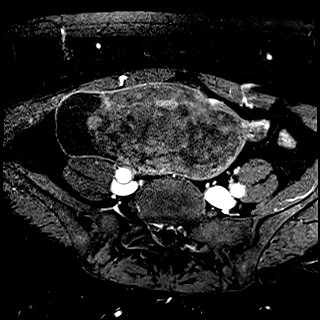
[im 120/144]
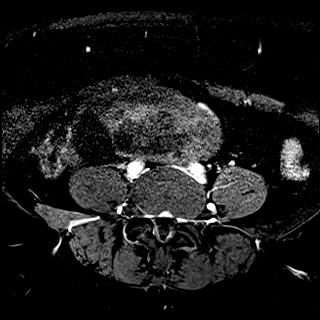
[im 144/144]
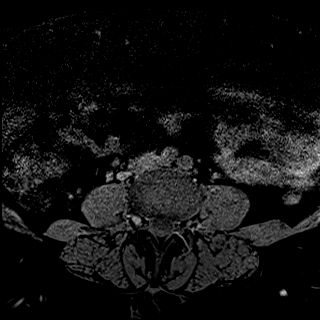

[Series 11: T1 fat-sat post-contrast · axial · 1.5mm · 0.75mm/px · z∈[-105,+74]mm · 6 of 144 slices shown (2 of 2)]
[im 1/144]
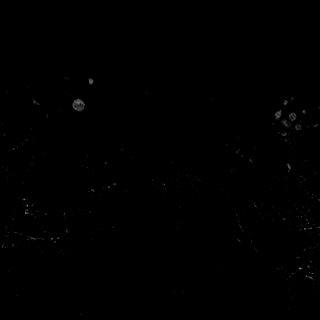
[im 24/144]
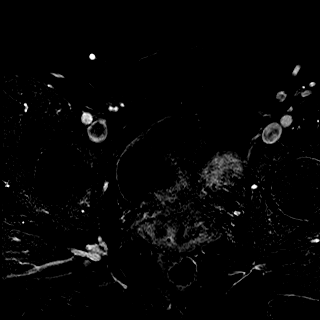
[im 48/144]
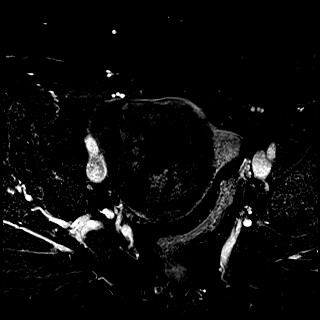
[im 72/144]
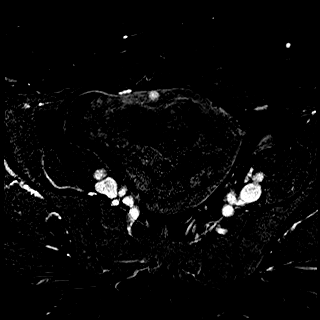
[im 96/144]
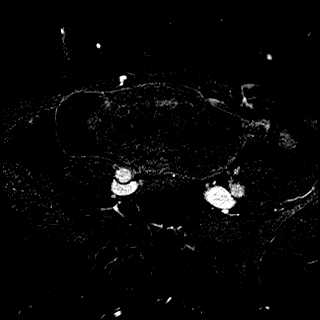
[im 120/144]
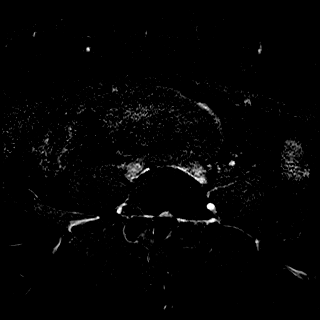

[36 of 48 positions shown; findings below may reference images not displayed]

FINDINGS: Lower Urinary Tract: No urinary bladder or urethral abnormality
identified.

Bowel: Unremarkable appearance of rectum and other pelvic bowel
loops.

Vascular/Lymphatic: Unremarkable. No pathologically enlarged pelvic
lymph nodes identified.

Reproductive:

-- Uterus: Measures 18.9 x 9.7 by 13.9 cm (volume = 1211 cm^3). A
large subserosal fibroid is seen arising from the right lateral
uterine corpus and fundus, which extends superiorly into the right
abdomen. This measures 15.8 x 9.7 by 12.9 cm. A 1 cm intramural
fibroid is also seen in the right fundal region. No abnormal
endometrial thickening. Cervix and vagina are unremarkable.

-- Right ovary: Not directly visualized, however no right adnexal
mass identified.

-- Left ovary: Appears normal. No ovarian or adnexal masses
identified.

Other: No peritoneal thickening or abnormal free fluid.

Musculoskeletal:  Unremarkable.
IMPRESSION: 1. 16 cm subserosal fibroid, which extends superiorly into the right
abdomen.
2. 1 cm intramural fibroid in the right fundal region.
3. No adnexal mass identified.

## 2022-03-02 ENCOUNTER — Ambulatory Visit (INDEPENDENT_AMBULATORY_CARE_PROVIDER_SITE_OTHER): Payer: BC Managed Care – PPO | Admitting: Psychology

## 2022-03-02 DIAGNOSIS — F411 Generalized anxiety disorder: Secondary | ICD-10-CM
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
## 2022-03-05 ENCOUNTER — Other Ambulatory Visit (HOSPITAL_BASED_OUTPATIENT_CLINIC_OR_DEPARTMENT_OTHER): Payer: Self-pay

## 2022-03-09 ENCOUNTER — Other Ambulatory Visit (HOSPITAL_BASED_OUTPATIENT_CLINIC_OR_DEPARTMENT_OTHER): Payer: Self-pay

## 2022-03-09 ENCOUNTER — Encounter (INDEPENDENT_AMBULATORY_CARE_PROVIDER_SITE_OTHER): Payer: Self-pay | Admitting: Nurse Practitioner

## 2022-03-09 ENCOUNTER — Ambulatory Visit (INDEPENDENT_AMBULATORY_CARE_PROVIDER_SITE_OTHER): Payer: BC Managed Care – PPO | Admitting: Nurse Practitioner

## 2022-03-09 VITALS — BP 130/86 | HR 75 | Temp 98.4°F | Ht 68.0 in | Wt 276.0 lb

## 2022-03-09 DIAGNOSIS — R0609 Other forms of dyspnea: Secondary | ICD-10-CM

## 2022-03-09 DIAGNOSIS — R632 Polyphagia: Secondary | ICD-10-CM

## 2022-03-09 DIAGNOSIS — I10 Essential (primary) hypertension: Secondary | ICD-10-CM | POA: Diagnosis not present

## 2022-03-09 DIAGNOSIS — R7301 Impaired fasting glucose: Secondary | ICD-10-CM | POA: Diagnosis not present

## 2022-03-09 DIAGNOSIS — E669 Obesity, unspecified: Secondary | ICD-10-CM | POA: Diagnosis not present

## 2022-03-09 DIAGNOSIS — Z7985 Long-term (current) use of injectable non-insulin antidiabetic drugs: Secondary | ICD-10-CM

## 2022-03-09 DIAGNOSIS — Z6841 Body Mass Index (BMI) 40.0 and over, adult: Secondary | ICD-10-CM

## 2022-03-09 MED ORDER — TIRZEPATIDE 7.5 MG/0.5ML ~~LOC~~ SOAJ
7.5000 mg | SUBCUTANEOUS | 0 refills | Status: DC
  Filled 2022-03-09: qty 2, 28d supply, fill #0

## 2022-03-10 ENCOUNTER — Other Ambulatory Visit (HOSPITAL_BASED_OUTPATIENT_CLINIC_OR_DEPARTMENT_OTHER): Payer: Self-pay

## 2022-03-10 LAB — COMPREHENSIVE METABOLIC PANEL
ALT: 25 IU/L (ref 0–32)
AST: 18 IU/L (ref 0–40)
Albumin/Globulin Ratio: 1.6 (ref 1.2–2.2)
Albumin: 4.2 g/dL (ref 3.8–4.9)
Alkaline Phosphatase: 122 IU/L — ABNORMAL HIGH (ref 44–121)
BUN/Creatinine Ratio: 20 (ref 9–23)
BUN: 13 mg/dL (ref 6–24)
Bilirubin Total: 0.4 mg/dL (ref 0.0–1.2)
CO2: 23 mmol/L (ref 20–29)
Calcium: 9.1 mg/dL (ref 8.7–10.2)
Chloride: 103 mmol/L (ref 96–106)
Creatinine, Ser: 0.64 mg/dL (ref 0.57–1.00)
Globulin, Total: 2.6 g/dL (ref 1.5–4.5)
Glucose: 99 mg/dL (ref 70–99)
Potassium: 3.9 mmol/L (ref 3.5–5.2)
Sodium: 140 mmol/L (ref 134–144)
Total Protein: 6.8 g/dL (ref 6.0–8.5)
eGFR: 107 mL/min/{1.73_m2} (ref >59)

## 2022-03-10 LAB — HEMOGLOBIN A1C
Est. average glucose Bld gHb Est-mCnc: 105 mg/dL
Hgb A1c MFr Bld: 5.3 % (ref 4.8–5.6)

## 2022-03-10 LAB — INSULIN, RANDOM: INSULIN: 7.9 u[IU]/mL (ref 2.6–24.9)

## 2022-03-10 NOTE — Progress Notes (Unsigned)
Chief Complaint:   OBESITY Alexa Anderson is here to discuss her progress with her obesity treatment plan along with follow-up of her obesity related diagnoses. Alexa Anderson is on keeping a food journal and adhering to recommended goals of 1700-1800 calories and 120 grams of protein and states she is following her eating plan approximately 70% of the time. Alexa Anderson states she is counting 10,000 steps daily 2 times per week.  Today's visit was #: 59 Starting weight: 290 lbs Starting date: 03/21/2020 Today's weight: 276 lbs Today's date: 03/09/2022 Total lbs lost to date: 14 lbs Total lbs lost since last in-office visit: 0  Interim History: Alexa Anderson is under some stress since her last visit. She is currently trying to find some where to live. She is not tracking her calories. She is following [facility]/[place]. She is skipping dinner. She tends to eat peanuts and apples for dinner. She doesn't feel she is eating enough food. She is taking phentermine 37.5 mg. She reports side effects of headaches. She has taken [facility] in the past but stopped due to availability.   Subjective:   1. Impaired fasting glucose/polyphagia Alexa Anderson is taking Mounjaro 7.5 mg. She reports side effects of headaches and feeling more emotional. She states Mounjaro helps to feel full for around 4 days and then it starts wearing off.   2. Essential hypertension Alexa Anderson is taking Norvasc 5 mg. She denies side effects. She take Inderal 20 mg 1-2 times per day for anxiety. She denies chest pain and palpitations.   3. Dyspnea on exertion Alexa Anderson's last IC was 2300 on 03/21/2020. Today her IC was 2376.  Assessment/Plan:   1. Impaired fasting glucose/polyphagia We will refill Mounjaro 7.5 mg for 1 month with no refills. Labs obtained today. Intensive lifestyle modifications are the first line treatment for this issue. We discussed several lifestyle modifications today and she will continue to work on diet, exercise and weight loss efforts. Orders and  follow up as documented in patient record.  Counseling Polyphagia is excessive hunger. Causes can include: low blood sugars, hypERthyroidism, PMS, lack of sleep, stress, insulin resistance, diabetes, certain medications, and diets that are deficient in protein and fiber.    - tirzepatide (MOUNJARO) 7.5 MG/0.5ML Pen; Inject 7.5 mg into the skin once a week.  Dispense: 2 mL; Refill: 0 - Comprehensive metabolic panel - Hemoglobin A1c - Insulin, random  2. Essential hypertension Alexa Anderson will continue to follow up with her primary care physician. She will continue medication as directed. She is working on healthy weight loss and exercise to improve blood pressure control. We will watch for signs of hypotension as she continues her lifestyle modifications.  3. Dyspnea on exertion Alexa Anderson does feel that she gets out of breath more easily that she used to when she exercises. Alexa Anderson's shortness of breath appears to be obesity related and exercise induced. She has agreed to work on weight loss and gradually increase exercise to treat her exercise induced shortness of breath. Will continue to monitor closely.   4. Obesity, current BMI 42.1 Alexa Anderson is currently in the action stage of change. As such, her goal is to continue with weight loss efforts. She has agreed to practicing portion control and making smarter food choices, such as increasing vegetables and decreasing simple carbohydrates.   Alexa Anderson will stop phentermine due to hypertension. Her goal is to eat more protein and not skip meals. Labs obtained today.   Exercise goals:  As is.   Behavioral modification strategies: increasing lean protein intake,  increasing water intake, and no skipping meals.  Alexa Anderson has agreed to follow-up with our clinic in 2 weeks. She was informed of the importance of frequent follow-up visits to maximize her success with intensive lifestyle modifications for her multiple health conditions.   Objective:   Blood  pressure 130/86, pulse 75, temperature 98.4 F (36.9 C), height '5\' 8"'$  (1.727 m), weight 276 lb (125.2 kg), SpO2 98 %. Body mass index is 41.97 kg/m.  General: Cooperative, alert, well developed, in no acute distress. HEENT: Conjunctivae and lids unremarkable. Cardiovascular: Regular rhythm.  Lungs: Normal work of breathing. Neurologic: No focal deficits.   Lab Results  Component Value Date   CREATININE 0.64 03/09/2022   BUN 13 03/09/2022   NA 140 03/09/2022   K 3.9 03/09/2022   CL 103 03/09/2022   CO2 23 03/09/2022   Lab Results  Component Value Date   ALT 25 03/09/2022   AST 18 03/09/2022   ALKPHOS 122 (H) 03/09/2022   BILITOT 0.4 03/09/2022   Lab Results  Component Value Date   HGBA1C 5.3 03/09/2022   HGBA1C 5.1 05/09/2021   HGBA1C 5.5 01/02/2021   HGBA1C 5.4 03/21/2020   HGBA1C 5.3 09/05/2013   Lab Results  Component Value Date   INSULIN 7.9 03/09/2022   INSULIN 9.2 01/02/2021   INSULIN 8.9 03/21/2020   Lab Results  Component Value Date   TSH 1.85 05/09/2021   Lab Results  Component Value Date   CHOL 156 05/09/2021   HDL 49.70 05/09/2021   LDLCALC 96 05/09/2021   TRIG 54.0 05/09/2021   CHOLHDL 3 05/09/2021   Lab Results  Component Value Date   VD25OH 32.6 01/02/2021   VD25OH 39.8 03/21/2020   Lab Results  Component Value Date   WBC 7.5 05/09/2021   HGB 13.1 05/09/2021   HCT 39.0 05/09/2021   MCV 86.8 05/09/2021   PLT 273.0 05/09/2021   Lab Results  Component Value Date   IRON 64 01/02/2021   TIBC 269 01/02/2021   FERRITIN 37 01/02/2021   Attestation Statements:   Reviewed by clinician on day of visit: allergies, medications, problem list, medical history, surgical history, family history, social history, and previous encounter notes.  I, Lizbeth Bark, RMA, am acting as Location manager for Everardo Pacific, FNP.   I have reviewed the above documentation for accuracy and completeness, and I agree with the above. -  ***

## 2022-03-20 ENCOUNTER — Other Ambulatory Visit (HOSPITAL_BASED_OUTPATIENT_CLINIC_OR_DEPARTMENT_OTHER): Payer: Self-pay

## 2022-03-23 ENCOUNTER — Other Ambulatory Visit (HOSPITAL_BASED_OUTPATIENT_CLINIC_OR_DEPARTMENT_OTHER): Payer: Self-pay

## 2022-03-25 ENCOUNTER — Encounter (INDEPENDENT_AMBULATORY_CARE_PROVIDER_SITE_OTHER): Payer: Self-pay | Admitting: Nurse Practitioner

## 2022-03-25 ENCOUNTER — Ambulatory Visit (INDEPENDENT_AMBULATORY_CARE_PROVIDER_SITE_OTHER): Payer: BC Managed Care – PPO | Admitting: Nurse Practitioner

## 2022-03-25 ENCOUNTER — Other Ambulatory Visit (HOSPITAL_BASED_OUTPATIENT_CLINIC_OR_DEPARTMENT_OTHER): Payer: Self-pay

## 2022-03-25 VITALS — BP 129/83 | HR 76 | Temp 98.4°F | Ht 68.0 in | Wt 275.0 lb

## 2022-03-25 DIAGNOSIS — Z6841 Body Mass Index (BMI) 40.0 and over, adult: Secondary | ICD-10-CM

## 2022-03-25 DIAGNOSIS — Z7985 Long-term (current) use of injectable non-insulin antidiabetic drugs: Secondary | ICD-10-CM

## 2022-03-25 DIAGNOSIS — E669 Obesity, unspecified: Secondary | ICD-10-CM | POA: Diagnosis not present

## 2022-03-25 DIAGNOSIS — R632 Polyphagia: Secondary | ICD-10-CM | POA: Diagnosis not present

## 2022-03-25 DIAGNOSIS — R7301 Impaired fasting glucose: Secondary | ICD-10-CM

## 2022-03-25 MED ORDER — TIRZEPATIDE 7.5 MG/0.5ML ~~LOC~~ SOAJ
7.5000 mg | SUBCUTANEOUS | 0 refills | Status: DC
  Filled 2022-03-25 – 2022-05-18 (×2): qty 2, 28d supply, fill #0

## 2022-03-26 NOTE — Progress Notes (Signed)
Chief Complaint:   OBESITY Alexa Anderson is here to discuss her progress with her obesity treatment plan along with follow-up of her obesity related diagnoses. Alexa Anderson is on practicing portion control and making smarter food choices, such as increasing vegetables and decreasing simple carbohydrates and states she is following her eating plan approximately 80% of the time. Alexa Anderson states she is walking 10,000 2-3 times per week.  Today's visit was #: 35 Starting weight: 290 lbs Starting date: 03/21/2020 Today's weight: 275 lbs Today's date: 03/25/2022 Total lbs lost to date: 15 lbs Total lbs lost since last in-office visit: 1  Interim History: Alexa Anderson is focusing on eating more protein. She is looking forward to moving into a house June 30th. She has been cooking more at home. She is struggling to eat all her calories and protein due to decreased appetite since starting Moujaro. She is drinking water, unsweetened, coffee and rarely a Coke Zero.  Subjective:   1. Impaired fasting glucose/polyphagia Alexa Anderson is currently on Mounjaro 7.5 mg. Denies any side effects. She is no longer having headaches. Her last insulin looked better, A1c was 5.3.  Assessment/Plan:   1. Impaired fasting glucose/polyphagia We will refill Mounjaro 7.5 mg SubQ once weekly for 1 month with 0 refills.  -Refill tirzepatide (MOUNJARO) 7.5 MG/0.5ML Pen; Inject 7.5 mg into the skin once a week.  Dispense: 2 mL; Refill: 0  Deshaun will continue to work on weight loss, exercise, and decreasing simple carbohydrates to help decrease the risk of diabetes. Alexa Anderson agreed to follow-up with [place] as directed to closely monitor her progress.   2. Obesity, current BMI 41.9 Alexa Anderson is currently in the action stage of change. As such, her goal is to continue with weight loss efforts. She has agreed to practicing portion control and making smarter food choices, such as increasing vegetables and decreasing simple carbohydrates.   Exercise  goals: As is.  Alexa Anderson will work on stress reduction and meeting calories and protein goals.  Behavioral modification strategies: increasing water intake and keeping a strict food journal.  Alexa Anderson has agreed to follow-up with our clinic in 3 weeks. She was informed of the importance of frequent follow-up visits to maximize her success with intensive lifestyle modifications for her multiple health conditions.   Objective:   Blood pressure 129/83, pulse 76, temperature 98.4 F (36.9 C), height '5\' 8"'$  (1.727 m), weight 275 lb (124.7 kg), SpO2 99 %. Body mass index is 41.81 kg/m.  General: Cooperative, alert, well developed, in no acute distress. HEENT: Conjunctivae and lids unremarkable. Cardiovascular: Regular rhythm.  Lungs: Normal work of breathing. Neurologic: No focal deficits.   Lab Results  Component Value Date   CREATININE 0.64 03/09/2022   BUN 13 03/09/2022   NA 140 03/09/2022   K 3.9 03/09/2022   CL 103 03/09/2022   CO2 23 03/09/2022   Lab Results  Component Value Date   ALT 25 03/09/2022   AST 18 03/09/2022   ALKPHOS 122 (H) 03/09/2022   BILITOT 0.4 03/09/2022   Lab Results  Component Value Date   HGBA1C 5.3 03/09/2022   HGBA1C 5.1 05/09/2021   HGBA1C 5.5 01/02/2021   HGBA1C 5.4 03/21/2020   HGBA1C 5.3 09/05/2013   Lab Results  Component Value Date   INSULIN 7.9 03/09/2022   INSULIN 9.2 01/02/2021   INSULIN 8.9 03/21/2020   Lab Results  Component Value Date   TSH 1.85 05/09/2021   Lab Results  Component Value Date   CHOL 156 05/09/2021  HDL 49.70 05/09/2021   LDLCALC 96 05/09/2021   TRIG 54.0 05/09/2021   CHOLHDL 3 05/09/2021   Lab Results  Component Value Date   VD25OH 32.6 01/02/2021   VD25OH 39.8 03/21/2020   Lab Results  Component Value Date   WBC 7.5 05/09/2021   HGB 13.1 05/09/2021   HCT 39.0 05/09/2021   MCV 86.8 05/09/2021   PLT 273.0 05/09/2021   Lab Results  Component Value Date   IRON 64 01/02/2021   TIBC 269 01/02/2021    FERRITIN 37 01/02/2021   Attestation Statements:   Reviewed by clinician on day of visit: allergies, medications, problem list, medical history, surgical history, family history, social history, and previous encounter notes.  I, Brendell Tyus, RMA, am acting as transcriptionist for Everardo Pacific, FNP..  I have reviewed the above documentation for accuracy and completeness, and I agree with the above. Everardo Pacific, FNP

## 2022-04-06 ENCOUNTER — Ambulatory Visit (INDEPENDENT_AMBULATORY_CARE_PROVIDER_SITE_OTHER): Payer: BC Managed Care – PPO | Admitting: Psychology

## 2022-04-06 DIAGNOSIS — F411 Generalized anxiety disorder: Secondary | ICD-10-CM | POA: Diagnosis not present

## 2022-04-06 NOTE — Progress Notes (Signed)
[facility] Counselor/Therapist Progress Note  Patient ID: Alexa Anderson, MRN: [id number]   Date: 04/06/22  Time Spent: 5:03  pm - 5:55 pm : 52 Minutes  Treatment Type: Individual Therapy.  Reported Symptoms: Depression and Anxiety.   Mental Status Exam: Appearance:  Well Groomed     Behavior: Appropriate  Motor: Normal  Speech/Language:  Clear and Coherent  Affect: Depressed  Mood: depressed  Thought process: normal  Thought content:   WNL  Sensory/Perceptual disturbances:   WNL  Orientation: oriented to person, place, time/date, and situation  Attention: Good  Concentration: Good  Memory: WNL  Fund of knowledge:  Good  Insight:   Good  Judgment:  Good  Impulse Control: Good   Risk Assessment: Danger to Self:  No Self-injurious Behavior: No Danger to Others: No Duty to Warn:no Physical Aggression / Violence:No  Access to Firearms a concern: No  Gang Involvement:No   Subjective:   Alexa Anderson participated from home, via video and consented to treatment. Therapist participated from home office. We met online due to [place] pandemic. Alexa Anderson reviewed the events of the past week. We reviewed numerous treatment approaches including CBT, BA, Problem Solving, and Solution focused therapy. Psych-education regarding the Alexa's diagnosis of Generalized anxiety disorder was provided during the session. We discussed Alexa Anderson's goals treatment goals which include process events, process past relationships, manage stress more positively, challenge negative self-talk, identify boundaries for self and others, improve self-esteem, manage mood, and learn more about self. Alexa Anderson provided verbal approval of the treatment plan.   Interventions: Psycho-education & Goal Setting.   Diagnosis:  Generalized anxiety disorder  Treatment Plan:  Client Abilities/Strengths Alexa Anderson is forthcoming and motivated for change.   Support System: Family and  friends.   Client Treatment Preferences Outpatient Therapy.   Client Statement of Needs Alexa Anderson would like to process events, process past relationships, manage stress more positively, identify boundaries for self and others, improve self-esteem, manage mood, and learn more about self.   Treatment Level Weekly  Symptoms Anxiety: feeling nervious, difficulty managing worry, worrying about different things, trouble relaxing, irritability.    (Status: maintained) Depression: feeling down, fluctuating sleep, fluctuating appetite, and feeling bad about self.  (Status: maintained)  Goals:   Alexa Anderson experiences symptoms of depression and anxiety.   Target Date: 04/07/23 Frequency: Weekly  Progress: 0 Modality: individual    Therapist will provide referrals for additional resources as appropriate.  Therapist will provide psycho-education regarding Alexa Anderson's diagnosis and corresponding treatment approaches and interventions. Licensed Clinical Social Worker, [place], LCSW will support the patient's ability to achieve the goals identified. will employ CBT, BA, Problem-solving, Solution Focused, Mindfulness,  coping skills, & other evidenced-based practices will be used to promote progress towards healthy functioning to help manage decrease symptoms associated with her diagnosis.   Reduce overall level, frequency, and intensity of the feelings of depression and anxiety evidenced by decreased overall symptoms from 6 to 7 days/week to 0 to 1 days/week per client report for at least 3 consecutive months. Verbally express understanding of the relationship between feelings of depression, anxiety and their impact on thinking patterns and behaviors. Verbalize an understanding of the role that distorted thinking plays in creating fears, excessive worry, and ruminations.  Linus Orn participated in the creation of the treatment plan)  Buena Irish, LCSW

## 2022-04-07 ENCOUNTER — Telehealth: Payer: Self-pay | Admitting: Nurse Practitioner

## 2022-04-07 ENCOUNTER — Telehealth: Payer: Self-pay | Admitting: Physician Assistant

## 2022-04-07 DIAGNOSIS — R21 Rash and other nonspecific skin eruption: Secondary | ICD-10-CM

## 2022-04-07 MED ORDER — TRIAMCINOLONE ACETONIDE 0.1 % EX CREA: 1.0000 | TOPICAL_CREAM | Freq: Two times a day (BID) | CUTANEOUS | 0 refills | Status: DC

## 2022-04-07 MED ORDER — PREDNISONE 10 MG PO TABS: ORAL_TABLET | ORAL | 0 refills | Status: AC

## 2022-04-07 NOTE — Progress Notes (Signed)
I have spent 5 minutes in review of e-visit questionnaire, review and updating patient chart, medical decision making and response to patient.   Victoriano Campion Cody Boykin Baetz, PA-C    

## 2022-04-07 NOTE — Progress Notes (Signed)
E-Visit for [employer]  We are sorry that you are not feeing well.  Here is how we plan to help!  Based on what you have shared with me it looks like you have had an allergic reaction to the oily resin from a group of plants.  This resin is very sticky, so it easily attaches to your skin, clothing, tools equipment, and pet's fur.    This blistering rash is often called poison ivy rash although it can come from contact with the leaves, stems and roots of poison ivy, poison oak and poison sumac.  The oily resin contains urushiol (u-ROO-she-ol) that produces a skin rash on exposed skin.  The severity of the rash depends on the amount of urushiol that gets on your skin.  A section of skin with more urushiol on it may develop a rash sooner.  The rash usually develops 12-48 hours after exposure and can last two to three weeks.  Your skin must come in direct contact with the plant's oil to be affected.  Blister fluid doesn't spread the rash.  However, if you come into contact with a piece of clothing or pet fur that has urushiol on it, the rash may spread out.  You can also transfer the oil to other parts of your body with your fingers.  Often the rash looks like a straight line because of the way the plant brushes against your skin.  Since your rash is widespread or has resulted in a large number of blisters, I have prescribed an oral corticosteroid.  Please follow these recommendations:  I have sent a prednisone dose pack to your chosen pharmacy. Be sure to follow the instructions carefully and complete the entire prescription. You may use Benadryl or Caladryl topical lotions to sooth the itch and remember cool, not hot, showers and baths can help relieve the itching!  Place cool, wet compresses on the affected area for 15-30 minutes several times a day.  You may also take oral antihistamines, such as diphenhydramine (Benadryl, others), which may also help you sleep better.  Watch your skin for any purulent  (pus) drainage or red streaking from the site.  If this occurs, contact your provider.  You may require an antibiotic for a skin infection.  Make sure that the clothes you were wearing as well as any towels or sheets that may have come in contact with the oil (urushiol) are washed in detergent and hot water.       I have developed the following plan to treat your condition I am prescribing a two week course of steroids (37 tablets of 10 mg prednisone).  Days 1-4 take 4 tablets (40 mg) daily  Days 5-8 take 3 tablets (30 mg) daily, Days 9-11 take 2 tablets (20 mg) daily, Days 12-14 take 1 tablet (10 mg) daily.    I have also sent in a topical cream to use as directed.   What can you do to prevent this rash?  Avoid the plants.  Learn how to identify poison ivy, poison oak and poison sumac in all seasons.  When hiking or engaging in other activities that might expose you to these plants, try to stay on cleared pathways.  If camping, make sure you pitch your tent in an area free of these plants.  Keep pets from running through wooded areas so that urushiol doesn't accidentally stick to their fur, which you may touch.  Remove or kill the plants.  In your yard, you can  get rid of poison ivy by applying an herbicide or pulling it out of the ground, including the roots, while wearing heavy gloves.  Afterward remove the gloves and thoroughly wash them and your hands.  Don't burn poison ivy or related plants because the urushiol can be carried by smoke.  Wear protective clothing.  If needed, protect your skin by wearing socks, boots, pants, long sleeves and vinyl gloves.  Wash your skin right away.  Washing off the oil with soap and water within 30 minutes of exposure may reduce your chances of getting a poison ivy rash.  Even washing after an hour or so can help reduce the severity of the rash.  If you walk through some poison ivy and then later touch your shoes, you may get some urushiol on your hands, which  may then transfer to your face or body by touching or rubbing.  If the contaminated object isn't cleaned, the urushiol on it can still cause a skin reaction years later.    Be careful not to reuse towels after you have washed your skin.  Also carefully wash clothing in detergent and hot water to remove all traces of the oil.  Handle contaminated clothing carefully so you don't transfer the urushiol to yourself, furniture, rugs or appliances.  Remember that pets can carry the oil on their fur and paws.  If you think your pet may be contaminated with urushiol, put on some long rubber gloves and give your pet a bath.  Finally, be careful not to burn these plants as the smoke can contain traces of the oil.  Inhaling the smoke may result in difficulty breathing. If that occurred you should see a physician as soon as possible.  See your doctor right away if:  The reaction is severe or widespread You inhaled the smoke from burning poison ivy and are having difficulty breathing Your skin continues to swell The rash affects your eyes, mouth or genitals Blisters are oozing pus You develop a fever greater than 100 F (37.8 C) The rash doesn't get better within a few weeks.  If you scratch the poison ivy rash, bacteria under your fingernails may cause the skin to become infected.  See your doctor if pus starts oozing from the blisters.  Treatment generally includes antibiotics.  Poison ivy treatments are usually limited to self-care methods.  And the rash typically goes away on its own in two to three weeks.     If the rash is widespread or results in a large number of blisters, your doctor may prescribe an oral corticosteroid, such as prednisone.  If a bacterial infection has developed at the rash site, your doctor may give you a prescription for an oral antibiotic.  MAKE SURE YOU  Understand these instructions. Will watch your condition. Will get help right away if you are not doing well or get  worse.   Thank you for choosing an e-visit.  Your e-visit answers were reviewed by a board certified advanced clinical practitioner to complete your personal care plan. Depending upon the condition, your plan could have included both over the counter or prescription medications.  Please review your pharmacy choice. Make sure the pharmacy is open so you can pick up prescription now. If there is a problem, you may contact your provider through [employer] and have the prescription routed to another pharmacy.  Your safety is important to [place]. If you have drug allergies check your prescription carefully.   For the next 24  hours you can use MyChart to ask questions about today's visit, request a non-urgent call back, or ask for a work or school excuse. You will get an email in the next two days asking about your experience. I hope that your e-visit has been valuable and will speed your recovery.

## 2022-04-07 NOTE — Progress Notes (Signed)
Linus Orn, We tried to contact you via phone to get additional information on your rash and medical background but were not able to reach you.  I am going to close this visit without charge.   If you would like to be evaluated today please schedule again via Mychart.   Thank you Apolonio Schneiders Fnp-C

## 2022-04-07 NOTE — Progress Notes (Signed)
Message sent to patient requesting further input regarding current symptoms. Awaiting patient response.  

## 2022-04-13 ENCOUNTER — Emergency Department
Admission: RE | Admit: 2022-04-13 | Discharge: 2022-04-13 | Disposition: A | Discharge status: Home / Self Care | Payer: BC Managed Care – PPO | Source: Ambulatory Visit

## 2022-04-13 ENCOUNTER — Telehealth: Payer: Self-pay | Admitting: Family Medicine

## 2022-04-13 ENCOUNTER — Other Ambulatory Visit: Payer: Self-pay

## 2022-04-13 VITALS — BP 174/104 | HR 69 | Temp 99.1°F | Resp 16 | Ht 67.0 in | Wt 272.0 lb

## 2022-04-13 DIAGNOSIS — W57XXXA Bitten or stung by nonvenomous insect and other nonvenomous arthropods, initial encounter: Secondary | ICD-10-CM | POA: Diagnosis not present

## 2022-04-13 DIAGNOSIS — R21 Rash and other nonspecific skin eruption: Secondary | ICD-10-CM

## 2022-04-13 MED ORDER — METHYLPREDNISOLONE SODIUM SUCC 125 MG IJ SOLR
125.0000 mg | Freq: Once | INTRAMUSCULAR | Status: AC
  Administered 2022-04-13: 125 mg via INTRAMUSCULAR

## 2022-04-13 MED ORDER — DOXYCYCLINE HYCLATE 100 MG PO CAPS: 100.0000 mg | ORAL_CAPSULE | Freq: Two times a day (BID) | ORAL | 0 refills | Status: AC

## 2022-04-13 NOTE — Telephone Encounter (Signed)
Aware, will watch for correspondence  

## 2022-04-13 NOTE — ED Notes (Signed)
Patient is resting comfortably. 

## 2022-04-13 NOTE — ED Notes (Signed)
Patient got hot, clammy and very tearful after injection. I sat her down, gave her water and cold ginger ale. Our ice machine is out of order today. Fanned her and stayed with her.. BP 177/119

## 2022-04-13 NOTE — ED Notes (Signed)
Patient said she had not eaten today, was given a glass of water, 2 cans of Ginger Ale, 2 snacks. She laid down until she was stable.

## 2022-04-13 NOTE — Discharge Instructions (Addendum)
Instructed patient to take medication as directed with food to completion.  Encouraged patient to increase daily water intake while taking this medication.  Advised patient if symptoms worsen and/or unresolved please follow-up with PCP or here for further evaluation.

## 2022-04-13 NOTE — Telephone Encounter (Signed)
I spoke with pt; 04/04/22 pt was working in yard and thinks exposed to poison ivy.On 04/07/22 had e visit and was given prednisone tabs and triamcinolone cream and area on arms seemed to be improving but over weekend rash started spreading to shoulder and back and pt is not sure if spreading is due to scratching the area or possibly shingles. Pt said the area spreading is red but does not see blisters. Since pt has been taking prednisone and using triamcinolone cream since 04/07/22 and now spreading pt wants to be seen today. No available appts today at [facility] and pt will go to UC in or near [facility]. I scheduled pt an appt at [facility] for 04/13/22 at 3 PM. UC & ED precautions given and pt voiced understanding. Sending note to Dr Glori Bickers and [place] CMA.

## 2022-04-13 NOTE — Telephone Encounter (Signed)
Patient called, had a video visit for poison ivy, provided prednisone lotion  Spider bite on L elbow  Rash spreading to shoulder and back  Patient wondering if it is possibly shingles  Only wants to see [employer]

## 2022-04-13 NOTE — ED Provider Notes (Signed)
[name]    CSN: [id number] Arrival date & time: 04/13/22  1504      History   Chief Complaint Chief Complaint  Patient presents with   Rash    Entered by patient    HPI Alexa Anderson is a 51 y.o. female.   HPI 51 year old female presents with a rash of neck .PMH significant for morbid obesity, HTN, and migraine.  Patient was prescribed prednisone taper and triamcinolone by her primary care provider during ED visit on 04/07/22.  Patient reports is currently still taking prednisone taper.  Past Medical History:  Diagnosis Date   Anxiety    Depression    Goiter    History of depression    Hypertension    Migraines    Obesity    Weight loss    use to wiegh over 400 lbs    Patient Active Problem List   Diagnosis Date Noted   Asthma 02/02/2022   Colon cancer screening 05/09/2021   Elevated glucose level 03/12/2020   Enlarged uterus 02/19/2020   Enlarged ovary 02/19/2020   Leg heaviness 04/05/2019   Amenorrhea 12/12/2018   Tinnitus 10/24/2018   Encounter for screening mammogram for breast cancer 05/12/2016   Routine general medical examination at a health care facility 09/05/2013   Encounter for routine gynecological examination 09/05/2013   Stress reaction 09/05/2013   Screen for STD (sexually transmitted disease) 03/23/2012   Goiter 06/05/2008   Morbid obesity ([place]) 06/04/2008   Depression with anxiety 06/04/2008   Migraine without aura 06/04/2008   Essential hypertension 06/04/2008    Past Surgical History:  Procedure Laterality Date   CESAREAN SECTION     thyroid ultrasound  02/12   stable 5 mm nodule hypoechoic on left   WISDOM TOOTH EXTRACTION      OB History     Gravida  2   Para  1   Term      Preterm      AB  1   Living  1      SAB      IAB  1   Ectopic      Multiple      Live Births               Home Medications    Prior to Admission medications   Medication Sig Start Date End Date Taking?  Authorizing Provider  doxycycline (VIBRAMYCIN) 100 MG capsule Take 1 capsule (100 mg total) by mouth 2 (two) times daily for 10 days. 04/13/22 04/23/22 Yes Eliezer Lofts, FNP  albuterol (VENTOLIN HFA) 108 (90 Base) MCG/ACT inhaler Inhale 1-2 puffs into the lungs every 6 (six) hours as needed for wheezing or shortness of breath. 02/02/22   Tower, Wynelle Fanny, MD  amLODipine (NORVASC) 5 MG tablet TAKE 1 TABLET (5 MG) BY MOUTH ONCE DAILY. 02/02/22   Tower, Wynelle Fanny, MD  budesonide-formoterol [facility]) 160-4.5 MCG/ACT inhaler Inhale 2 puffs into the lungs 2 (two) times daily. 02/02/22   Tower, Wynelle Fanny, MD  escitalopram (LEXAPRO) 20 MG tablet Take 1 tablet (20 mg total) by mouth daily. 02/02/22   Tower, Wynelle Fanny, MD  montelukast (SINGULAIR) 10 MG tablet TAKE 1 TABLET BY MOUTH EVERYDAY AT BEDTIME 02/02/22   Tower, Wynelle Fanny, MD  predniSONE (DELTASONE) 10 MG tablet Take 4 tablets (40 mg total) by mouth daily with breakfast for 4 days, THEN 3 tablets (30 mg total) daily with breakfast for 4 days, THEN 2 tablets (20 mg total) daily  with breakfast for 3 days, THEN 1 tablet (10 mg total) daily with breakfast for 3 days. 04/07/22 04/21/22  Brunetta Jeans, PA-C  propranolol (INDERAL) 20 MG tablet Take 1 tablet (20 mg total) by mouth 3 (three) times daily. 02/02/22   Tower, Wynelle Fanny, MD  tirzepatide [facility]) 7.5 MG/0.5ML Pen Inject 7.5 mg into the skin once a week. 03/25/22   Everardo Pacific, FNP  traZODone (DESYREL) 50 MG tablet Take 1 tablet (50 mg total) by mouth at bedtime as needed for sleep. 02/02/22   Tower, Wynelle Fanny, MD  triamcinolone cream (KENALOG) 0.1 % Apply 1 Application topically 2 (two) times daily. 04/07/22   Brunetta Jeans, PA-C    Family History Family History  Problem Relation Age of Onset   Obesity Mother    Cancer Father        renal cell   Parkinsonism Father     Social History Social History   Tobacco Use   Smoking status: Never   Smokeless tobacco: Never  Vaping Use   Vaping Use: Never used   Substance Use Topics   Alcohol use: Yes    Comment: Occasional   Drug use: No     Allergies   Bee venom   Review of Systems Review of Systems  Skin:  Positive for rash.  All other systems reviewed and are negative.    Physical Exam Triage Vital Signs ED Triage Vitals  Enc Vitals Group     BP      Pulse      Resp      Temp      Temp src      SpO2      Weight      Height      Head Circumference      Peak Flow      Pain Score      Pain Loc      Pain Edu?      Excl. in [place]?    No data found.  Updated Vital Signs BP (!) 174/104 (BP Location: Left Arm)   Pulse 69   Temp 99.1 F (37.3 C) (Oral)   Resp 16   Ht '5\' 7"'$  (1.702 m)   Wt 272 lb (123.4 kg)   SpO2 96%   BMI 42.60 kg/m      Physical Exam Vitals and nursing note reviewed.  Constitutional:      General: She is not in acute distress.    Appearance: Normal appearance. She is obese. She is not ill-appearing.  HENT:     Head: Normocephalic and atraumatic.     Mouth/Throat:     Mouth: Mucous membranes are moist.     Pharynx: Oropharynx is clear.  Eyes:     Extraocular Movements: Extraocular movements intact.     Conjunctiva/sclera: Conjunctivae normal.     Pupils: Pupils are equal, round, and reactive to light.  Cardiovascular:     Rate and Rhythm: Normal rate and regular rhythm.     Pulses: Normal pulses.     Heart sounds: Normal heart sounds.  Pulmonary:     Effort: Pulmonary effort is normal.     Breath sounds: Normal breath sounds. No wheezing, rhonchi or rales.  Musculoskeletal:     Cervical back: Normal range of motion and neck supple.  Skin:    General: Skin is warm and dry.     Comments: Anterior neck (left sided inferior aspect): Pruritic erythematous maculopapular eruption, please see image inserted below;  left lower arm (dorsum): Erythematous, mildly indurated area of insect bite-please see image inserted below  Neurological:     General: No focal deficit present.     Mental Status:  She is alert and oriented to person, place, and time.         UC Treatments / Results  Labs (all labs ordered are listed, but only abnormal results are displayed) Labs Reviewed - No data to display  EKG   Radiology No results found.  Procedures Procedures (including critical care time)  Medications Ordered in UC Medications  methylPREDNISolone sodium succinate (SOLU-MEDROL) 125 mg/2 mL injection 125 mg (125 mg Intramuscular Given 04/13/22 1553)    Initial Impression / Assessment and Plan / UC Course  I have reviewed the triage vital signs and the nursing notes.  Pertinent labs & imaging results that were available during my care of the patient were reviewed by me and considered in my medical decision making (see chart for details).     MDM: 1.  Rash and nonspecific skin eruption-IM Solu-Medrol 125 mg given once in clinic prior to discharge; 2.  Infected insect bite, initial encounter-Rx'd Doxycycline. Instructed patient to take medication as directed with food to completion.  Encouraged patient to increase daily water intake while taking this medication.  Advised patient if symptoms worsen and/or unresolved please follow-up with PCP or here for further evaluation.  Nursing staff alerted me that patient had mild vasovagal symptoms after receiving IM injection this afternoon while I was in procedure room with another patient.  Patient remained in clinic for another 30 minutes while staff check blood pressure, heart rate and provided food and beverages for patient.  Patient discharged home, hemodynamically stable.  Final Clinical Impressions(s) / UC Diagnoses   Final diagnoses:  Rash and nonspecific skin eruption  Bug bite with infection, initial encounter     Discharge Instructions      Instructed patient to take medication as directed with food to completion.  Encouraged patient to increase daily water intake while taking this medication.  Advised patient if symptoms  worsen and/or unresolved please follow-up with PCP or here for further evaluation.     ED Prescriptions     Medication Sig Dispense Auth. Provider   doxycycline (VIBRAMYCIN) 100 MG capsule Take 1 capsule (100 mg total) by mouth 2 (two) times daily for 10 days. 20 capsule Eliezer Lofts, FNP      PDMP not reviewed this encounter.   Eliezer Lofts, [place] 04/13/22 1723

## 2022-04-13 NOTE — ED Triage Notes (Addendum)
Rash on left neck, left arm. Just moved into a different house. X 1 week Did e-visit on Tuesday, got Prednisone and cream.

## 2022-04-14 ENCOUNTER — Ambulatory Visit (INDEPENDENT_AMBULATORY_CARE_PROVIDER_SITE_OTHER): Payer: BC Managed Care – PPO | Admitting: Nurse Practitioner

## 2022-04-14 ENCOUNTER — Ambulatory Visit: Payer: BC Managed Care – PPO | Admitting: Psychology

## 2022-04-17 ENCOUNTER — Other Ambulatory Visit (HOSPITAL_BASED_OUTPATIENT_CLINIC_OR_DEPARTMENT_OTHER): Payer: Self-pay

## 2022-04-21 ENCOUNTER — Encounter: Payer: Self-pay | Admitting: Family Medicine

## 2022-04-21 ENCOUNTER — Ambulatory Visit: Payer: BC Managed Care – PPO | Admitting: Family Medicine

## 2022-04-21 DIAGNOSIS — R21 Rash and other nonspecific skin eruption: Secondary | ICD-10-CM | POA: Diagnosis not present

## 2022-04-21 DIAGNOSIS — L255 Unspecified contact dermatitis due to plants, except food: Secondary | ICD-10-CM | POA: Diagnosis not present

## 2022-04-21 MED ORDER — TRIAMCINOLONE ACETONIDE 0.1 % EX CREA: 1.0000 | TOPICAL_CREAM | Freq: Two times a day (BID) | CUTANEOUS | 0 refills | Status: DC

## 2022-04-21 MED ORDER — HYDROXYZINE HCL 25 MG PO TABS: 25.0000 mg | ORAL_TABLET | Freq: Every evening | ORAL | 0 refills | Status: DC | PRN

## 2022-04-21 NOTE — Assessment & Plan Note (Signed)
Moderately severe reaction, now starting to improve but miserable with itching  Rev UC note and e visit today  S/p steroids/finishing along with doxy for infection  Reassuring exam  Refilled triamconolone cream inst to clean with soap and water-not perioxide or alcohol Adv not to scratch and to stay cool  Cold compress may help  Px hydroxyzine for pm with warning re: sedation  inst to watch for s/s of infection / reviewed Update if not starting to improve in a week or if worsening

## 2022-04-21 NOTE — Progress Notes (Signed)
Subjective:    Patient ID: Alexa Anderson, female    DOB: 12-20-1970, 51 y.o.   MRN: [id number]  HPI Pt presents for rash   Wt Readings from Last 3 Encounters:  04/21/22 278 lb (126.1 kg)  04/13/22 272 lb (123.4 kg)  03/25/22 275 lb (124.7 kg)   43.54 kg/m  Following up for poison ivy dermatitis on left neck and arm  She had an e visit with PA Hassell Done on 7/4 Then seen in UC on 7/10 and dz with possible infected insect bite    A/p as follows MDM: 1.  Rash and nonspecific skin eruption-IM Solu-Medrol 125 mg given once in clinic prior to discharge; 2.  Infected insect bite, initial encounter-Rx'd Doxycycline. Instructed patient to take medication as directed with food to completion.  Encouraged patient to increase daily water intake while taking this medication.  Advised patient if symptoms worsen and/or unresolved please follow-up with PCP or here for further evaluation.  Nursing staff alerted me that patient had mild vasovagal symptoms after receiving IM injection this afternoon while I was in procedure room with another patient.  Patient remained in clinic for another 30 minutes while staff check blood pressure, heart rate and provided food and beverages for patient.  Patient discharged home, hemodynamically stable.    Rash still itches irritated in some areas Not worse but not better   Px triamcinolone cream and prednisone at first  Then IM solumedrol and doxycycline   This occurred after she worked on her new house /gutters  Knew she was exposed to it  Thinks that the area on elbow was an insect bite (but she did not see and insect)   Neck is better than it was but other areas are more stubborn  Has spot on her L elbow area  One on her leg  Neck   Using peroxide and alcohol and witch hazel   Out of the triamcinolone cream   Takes zyrtec every am  Has not used benadryl    Review of Systems  Constitutional:  Negative for activity change, appetite change, fatigue,  fever and unexpected weight change.  HENT:  Negative for congestion, ear pain, rhinorrhea, sinus pressure and sore throat.   Eyes:  Negative for pain, redness and visual disturbance.  Respiratory:  Negative for cough, shortness of breath and wheezing.   Cardiovascular:  Negative for chest pain and palpitations.  Gastrointestinal:  Negative for abdominal pain, blood in stool, constipation and diarrhea.  Endocrine: Negative for polydipsia and polyuria.  Genitourinary:  Negative for dysuria, frequency and urgency.  Musculoskeletal:  Negative for arthralgias, back pain and myalgias.  Skin:  Positive for rash. Negative for pallor.       Itching   Allergic/Immunologic: Negative for environmental allergies.  Neurological:  Negative for dizziness, syncope and headaches.  Hematological:  Negative for adenopathy. Does not bruise/bleed easily.  Psychiatric/Behavioral:  Negative for decreased concentration and dysphoric mood. The patient is not nervous/anxious.        Objective:   Physical Exam Constitutional:      Appearance: Normal appearance. She is obese.  HENT:     Head: Normocephalic and atraumatic.  Eyes:     General:        Right eye: No discharge.        Left eye: No discharge.     Conjunctiva/sclera: Conjunctivae normal.  Cardiovascular:     Rate and Rhythm: Normal rate and regular rhythm.     Heart sounds: Normal heart  sounds.  Pulmonary:     Effort: Pulmonary effort is normal. No respiratory distress.     Breath sounds: Normal breath sounds. No wheezing or rales.  Musculoskeletal:     Cervical back: Neck supple. No tenderness.  Lymphadenopathy:     Cervical: No cervical adenopathy.  Skin:    General: Skin is warm and dry.     Comments: Areas of erythematous drying papules/vesicles on L arm/ R upper leg and L neck  Some in linear pattern  No ecchymosis or open skin Larger patch on L elbow and R upper leg  Appear improved (especially neck)   Neurological:     Mental  Status: She is alert.  Psychiatric:        Mood and Affect: Mood normal.                    Patient Active Problem List   Diagnosis Date Noted   Plant dermatitis 04/21/2022   Asthma 02/02/2022   Colon cancer screening 05/09/2021   Elevated glucose level 03/12/2020   Enlarged uterus 02/19/2020   Enlarged ovary 02/19/2020   Leg heaviness 04/05/2019   Amenorrhea 12/12/2018   Tinnitus 10/24/2018   Encounter for screening mammogram for breast cancer 05/12/2016   Routine general medical examination at a health care facility 09/05/2013   Encounter for routine gynecological examination 09/05/2013   Stress reaction 09/05/2013   Screen for STD (sexually transmitted disease) 03/23/2012   Goiter 06/05/2008   Morbid obesity ([place]) 06/04/2008   Depression with anxiety 06/04/2008   Migraine without aura 06/04/2008   Essential hypertension 06/04/2008   Past Medical History:  Diagnosis Date   Anxiety    Depression    Goiter    History of depression    Hypertension    Migraines    Obesity    Weight loss    use to wiegh over 400 lbs   Past Surgical History:  Procedure Laterality Date   CESAREAN SECTION     thyroid ultrasound  02/12   stable 5 mm nodule hypoechoic on left   WISDOM TOOTH EXTRACTION     Social History   Tobacco Use   Smoking status: Never   Smokeless tobacco: Never  Vaping Use   Vaping Use: Never used  Substance Use Topics   Alcohol use: Yes    Comment: Occasional   Drug use: No   Family History  Problem Relation Age of Onset   Obesity Mother    Cancer Father        renal cell   Parkinsonism Father    Allergies  Allergen Reactions   Bee Venom Anaphylaxis   Current Outpatient Medications on File Prior to Visit  Medication Sig Dispense Refill   albuterol (VENTOLIN HFA) 108 (90 Base) MCG/ACT inhaler Inhale 1-2 puffs into the lungs every 6 (six) hours as needed for wheezing or shortness of breath. 8.5 g 3   amLODipine (NORVASC) 5 MG tablet  TAKE 1 TABLET (5 MG) BY MOUTH ONCE DAILY. 90 tablet 3   budesonide-formoterol (SYMBICORT) 160-4.5 MCG/ACT inhaler Inhale 2 puffs into the lungs 2 (two) times daily. 10.2 g 3   doxycycline (VIBRAMYCIN) 100 MG capsule Take 1 capsule (100 mg total) by mouth 2 (two) times daily for 10 days. 20 capsule 0   escitalopram (LEXAPRO) 20 MG tablet Take 1 tablet (20 mg total) by mouth daily. 90 tablet 0   montelukast (SINGULAIR) 10 MG tablet TAKE 1 TABLET BY MOUTH EVERYDAY AT BEDTIME 90 tablet  3   predniSONE (DELTASONE) 10 MG tablet Take 4 tablets (40 mg total) by mouth daily with breakfast for 4 days, THEN 3 tablets (30 mg total) daily with breakfast for 4 days, THEN 2 tablets (20 mg total) daily with breakfast for 3 days, THEN 1 tablet (10 mg total) daily with breakfast for 3 days. 37 tablet 0   propranolol (INDERAL) 20 MG tablet Take 1 tablet (20 mg total) by mouth 3 (three) times daily. 270 tablet 3   tirzepatide (MOUNJARO) 7.5 MG/0.5ML Pen Inject 7.5 mg into the skin once a week. 2 mL 0   traZODone (DESYREL) 50 MG tablet Take 1 tablet (50 mg total) by mouth at bedtime as needed for sleep. 30 tablet 3   No current facility-administered medications on file prior to visit.    Assessment & Plan:   Problem List Items Addressed This Visit       Musculoskeletal and Integument   Plant dermatitis    Moderately severe reaction, now starting to improve but miserable with itching  Rev UC note and e visit today  S/p steroids/finishing along with doxy for infection  Reassuring exam  Refilled triamconolone cream inst to clean with soap and water-not perioxide or alcohol Adv not to scratch and to stay cool  Cold compress may help  Px hydroxyzine for pm with warning re: sedation  inst to watch for s/s of infection / reviewed Update if not starting to improve in a week or if worsening        Other Visit Diagnoses     Rash       Relevant Medications   triamcinolone cream (KENALOG) 0.1 %

## 2022-04-21 NOTE — Patient Instructions (Signed)
Clean the affected areas with soap and water (no alcohol or peroxide)   Keep cool - warm conditions make itching worse  Ice packs  /cool conditions are good  Avoid hot showers/baths or hot tubs   Finish current medicine  I re px the cream to use as needed   Try the hydroxyzine at bedtime- may be very sedating   If redness or swelling get worse please let me know immediately

## 2022-05-04 ENCOUNTER — Ambulatory Visit (INDEPENDENT_AMBULATORY_CARE_PROVIDER_SITE_OTHER): Payer: BC Managed Care – PPO | Admitting: Psychology

## 2022-05-04 DIAGNOSIS — F411 Generalized anxiety disorder: Secondary | ICD-10-CM

## 2022-05-04 NOTE — Progress Notes (Signed)
[facility] Counselor/Therapist Progress Note  Patient ID: Alexa Anderson, MRN: [id number]   Date: 05/04/22  Time Spent: 5:33 pm - 6:28 pm : 55 Minutes  Treatment Type: Individual Therapy.  Reported Symptoms: Depression and Anxiety.   Mental Status Exam: Appearance:  Well Groomed     Behavior: Appropriate  Motor: Normal  Speech/Language:  Clear and Coherent  Affect: Depressed  Mood: depressed  Thought process: normal  Thought content:   WNL  Sensory/Perceptual disturbances:   WNL  Orientation: oriented to person, place, time/date, and situation  Attention: Good  Concentration: Good  Memory: WNL  Fund of knowledge:  Good  Insight:   Good  Judgment:  Good  Impulse Control: Good   Risk Assessment: Danger to Self:  No Self-injurious Behavior: No Danger to Others: No Duty to Warn:no Physical Aggression / Violence:No  Access to Firearms a concern: No  Gang Involvement:No   Subjective:   Alexa Anderson participated from home, via video and consented to treatment. Therapist participated from office. We met online due to [place] pandemic. Alexa Anderson reviewed the events of the past week. Alexa Anderson noted interacting with her ex, Ludwig Clarks, and noting a need to discontinue contact with him due to continued poor decision-making by Alexa Anderson. We explored this during the session. She noted recently getting a promotion without any increased payment compensation or mileage reimbursement. She noted her efforts to advocate for self. We discussed her need to continue setting boundaries, during the session, going forward. She noted being slated for a job interview. Therapist encouraged Alexa Anderson to identify relationship boundaries, going forward, and create a list to be processed going forward. Alexa Anderson was engaged and motivated. Therapist encouraged Alexa Anderson to hone her independence. Therapist validated Alexa Anderson's feelings and provided supportive therapy. A follow-up was scheduled.     Interventions:  Interpersonal  Diagnosis:  Generalized anxiety disorder  Treatment Plan:  Client Abilities/Strengths Alexa Anderson is forthcoming and motivated for change.   Support System: Family and friends.   Client Treatment Preferences Outpatient Therapy.   Client Statement of Needs Alexa Anderson would like to process events, process past relationships, manage stress more positively, identify boundaries for self and others, improve self-esteem, manage mood, and learn more about self.   Treatment Level Weekly  Symptoms Anxiety: feeling nervious, difficulty managing worry, worrying about different things, trouble relaxing, irritability.    (Status: maintained) Depression: feeling down, fluctuating sleep, fluctuating appetite, and feeling bad about self.  (Status: maintained)  Goals:   Alexa Anderson experiences symptoms of depression and anxiety.   Target Date: 04/07/23 Frequency: Weekly  Progress: 0 Modality: individual    Therapist will provide referrals for additional resources as appropriate.  Therapist will provide psycho-education regarding Alexa Anderson's diagnosis and corresponding treatment approaches and interventions. Licensed Clinical Social Worker, [place], LCSW will support the patient's ability to achieve the goals identified. will employ CBT, BA, Problem-solving, Solution Focused, Mindfulness,  coping skills, & other evidenced-based practices will be used to promote progress towards healthy functioning to help manage decrease symptoms associated with her diagnosis.   Reduce overall level, frequency, and intensity of the feelings of depression and anxiety evidenced by decreased overall symptoms from 6 to 7 days/week to 0 to 1 days/week per client report for at least 3 consecutive months. Verbally express understanding of the relationship between feelings of depression, anxiety and their impact on thinking patterns and behaviors. Verbalize an understanding of the role that distorted thinking plays in  creating fears, excessive worry, and ruminations.  Alexa Anderson participated in the  creation of the treatment plan)  Buena Irish, LCSW

## 2022-05-05 HISTORY — PX: TOOTH EXTRACTION: SUR596

## 2022-05-13 ENCOUNTER — Encounter (INDEPENDENT_AMBULATORY_CARE_PROVIDER_SITE_OTHER): Payer: Self-pay

## 2022-05-18 ENCOUNTER — Other Ambulatory Visit: Payer: Self-pay | Admitting: Family Medicine

## 2022-05-18 ENCOUNTER — Other Ambulatory Visit (HOSPITAL_BASED_OUTPATIENT_CLINIC_OR_DEPARTMENT_OTHER): Payer: Self-pay

## 2022-05-18 DIAGNOSIS — F411 Generalized anxiety disorder: Secondary | ICD-10-CM

## 2022-05-18 MED ORDER — ESCITALOPRAM OXALATE 20 MG PO TABS
20.0000 mg | ORAL_TABLET | Freq: Every day | ORAL | 2 refills | Status: DC
  Filled 2022-05-18: qty 30, 30d supply, fill #0
  Filled 2022-06-25: qty 30, 30d supply, fill #1
  Filled 2022-11-23: qty 30, 30d supply, fill #2
  Filled 2023-01-04: qty 30, 30d supply, fill #3
  Filled 2023-02-18: qty 30, 30d supply, fill #4

## 2022-05-18 NOTE — Telephone Encounter (Signed)
Last filled 04/17/22 Last OV 01/01/22

## 2022-05-19 ENCOUNTER — Other Ambulatory Visit (HOSPITAL_BASED_OUTPATIENT_CLINIC_OR_DEPARTMENT_OTHER): Payer: Self-pay

## 2022-05-19 ENCOUNTER — Ambulatory Visit: Payer: BC Managed Care – PPO | Admitting: Psychology

## 2022-05-19 ENCOUNTER — Encounter (HOSPITAL_BASED_OUTPATIENT_CLINIC_OR_DEPARTMENT_OTHER): Payer: Self-pay

## 2022-06-02 ENCOUNTER — Ambulatory Visit (INDEPENDENT_AMBULATORY_CARE_PROVIDER_SITE_OTHER): Payer: BC Managed Care – PPO | Admitting: Nurse Practitioner

## 2022-06-04 ENCOUNTER — Ambulatory Visit (INDEPENDENT_AMBULATORY_CARE_PROVIDER_SITE_OTHER): Payer: BC Managed Care – PPO | Admitting: Nurse Practitioner

## 2022-06-05 ENCOUNTER — Other Ambulatory Visit (HOSPITAL_BASED_OUTPATIENT_CLINIC_OR_DEPARTMENT_OTHER): Payer: Self-pay

## 2022-06-09 ENCOUNTER — Ambulatory Visit: Payer: BC Managed Care – PPO | Admitting: Psychology

## 2022-06-25 ENCOUNTER — Other Ambulatory Visit (HOSPITAL_BASED_OUTPATIENT_CLINIC_OR_DEPARTMENT_OTHER): Payer: Self-pay

## 2022-08-06 ENCOUNTER — Other Ambulatory Visit (HOSPITAL_BASED_OUTPATIENT_CLINIC_OR_DEPARTMENT_OTHER): Payer: Self-pay

## 2022-11-20 ENCOUNTER — Other Ambulatory Visit (HOSPITAL_BASED_OUTPATIENT_CLINIC_OR_DEPARTMENT_OTHER): Payer: Self-pay

## 2023-01-13 ENCOUNTER — Ambulatory Visit: Payer: 59 | Admitting: Family Medicine

## 2023-01-13 ENCOUNTER — Encounter: Payer: Self-pay | Admitting: Family Medicine

## 2023-01-13 ENCOUNTER — Other Ambulatory Visit (HOSPITAL_BASED_OUTPATIENT_CLINIC_OR_DEPARTMENT_OTHER): Payer: Self-pay

## 2023-01-13 VITALS — BP 136/84 | HR 66 | Temp 97.7°F | Ht 67.0 in | Wt 293.0 lb

## 2023-01-13 DIAGNOSIS — R21 Rash and other nonspecific skin eruption: Secondary | ICD-10-CM | POA: Insufficient documentation

## 2023-01-13 DIAGNOSIS — R0989 Other specified symptoms and signs involving the circulatory and respiratory systems: Secondary | ICD-10-CM | POA: Diagnosis not present

## 2023-01-13 DIAGNOSIS — J4520 Mild intermittent asthma, uncomplicated: Secondary | ICD-10-CM

## 2023-01-13 DIAGNOSIS — J0110 Acute frontal sinusitis, unspecified: Secondary | ICD-10-CM

## 2023-01-13 DIAGNOSIS — R062 Wheezing: Secondary | ICD-10-CM

## 2023-01-13 LAB — POC COVID19 BINAXNOW: SARS Coronavirus 2 Ag: NEGATIVE

## 2023-01-13 MED ORDER — AMOXICILLIN-POT CLAVULANATE 875-125 MG PO TABS
1.0000 | ORAL_TABLET | Freq: Two times a day (BID) | ORAL | 0 refills | Status: DC
  Filled 2023-01-13: qty 14, 7d supply, fill #0

## 2023-01-13 MED ORDER — ALBUTEROL SULFATE HFA 108 (90 BASE) MCG/ACT IN AERS
1.0000 | INHALATION_SPRAY | Freq: Four times a day (QID) | RESPIRATORY_TRACT | 3 refills | Status: AC | PRN
Start: 2023-01-13 — End: ?
  Filled 2023-01-13: qty 8.5, 28d supply, fill #0
  Filled 2023-11-16: qty 8.5, 28d supply, fill #1

## 2023-01-13 MED ORDER — TRIAMCINOLONE ACETONIDE 0.1 % EX CREA
1.0000 | TOPICAL_CREAM | Freq: Two times a day (BID) | CUTANEOUS | 0 refills | Status: DC
Start: 2023-01-13 — End: 2023-07-22
  Filled 2023-01-13: qty 30, 15d supply, fill #0

## 2023-01-13 NOTE — Assessment & Plan Note (Signed)
2 wk into uri  Facial pain /purulent nasal drainage  Tenderness on exam  Tx with augmentin  Sympt care-see AVS Er precautions noted  Update if not starting to improve in a week or if worsening

## 2023-01-13 NOTE — Assessment & Plan Note (Signed)
Pt was doing well then lost ins and moved  Wants to get back on track  Mounjaro worked well prior along with the healthy wt clinic She plans to return   Enc her to get back on track with healthy eating now that she is settling in to new place

## 2023-01-13 NOTE — Progress Notes (Signed)
Subjective:    Patient ID: Alexa Anderson, female    DOB: 18-Oct-1970, 52 y.o.   MRN: [id number]  HPI Pt presents with uri symptoms  Fatigue Spot on neck    Wt Readings from Last 3 Encounters:  01/13/23 293 lb (132.9 kg)  04/21/22 278 lb (126.1 kg)  04/13/22 272 lb (123.4 kg)   45.89 kg/m  Vitals:   01/13/23 1438  BP: 136/84  Pulse: 66  Temp: 97.7 F (36.5 C)  SpO2: 98%   Started 2 weeks ago  In the midst of a move (stressful)  Was worn out    Congestion nasal  Yellow mucous  Cough - productive  Had some wheezing last week/ improved now   Facial pressure and head pressure and pain  Around and under eyes  Face is tender to touch a little  No swelling   Tired  Had fever and chills early on - for the first day  Unsure how high     Some ST in am - drainage is bad    Was supposed to travel for work today and had to cancel      Otc  Chlorcedin  (had to sign for it)  Allergy medicines - singulair  Symbicort - out of Albuterol -out of   Has not used inhalers since nov (was out of insurance for a while)    Spot on neck in hair line  Wants to check it  Noticed a few weeks ago  In am it feels sore (when she is hot) and it itches  Tries not to touch it  Has not put anything on it  Looks like dry skin   Patient Active Problem List   Diagnosis Date Noted   Rash of neck 01/13/2023   Asthma 02/02/2022   Colon cancer screening 05/09/2021   Elevated glucose level 03/12/2020   Enlarged uterus 02/19/2020   Enlarged ovary 02/19/2020   Leg heaviness 04/05/2019   Amenorrhea 12/12/2018   Tinnitus 10/24/2018   Acute sinusitis 08/17/2018   Encounter for screening mammogram for breast cancer 05/12/2016   Routine general medical examination at a health care facility 09/05/2013   Encounter for routine gynecological examination 09/05/2013   Stress reaction 09/05/2013   Screen for STD (sexually transmitted disease) 03/23/2012   Goiter 06/05/2008   Morbid  obesity 06/04/2008   Depression with anxiety 06/04/2008   Migraine without aura 06/04/2008   Essential hypertension 06/04/2008   Past Medical History:  Diagnosis Date   Anxiety    Depression    Goiter    History of depression    Hypertension    Migraines    Obesity    Weight loss    use to wiegh over 400 lbs   Past Surgical History:  Procedure Laterality Date   CESAREAN SECTION     thyroid ultrasound  02/12   stable 5 mm nodule hypoechoic on left   WISDOM TOOTH EXTRACTION     Social History   Tobacco Use   Smoking status: Never   Smokeless tobacco: Never  Vaping Use   Vaping Use: Never used  Substance Use Topics   Alcohol use: Yes    Comment: Occasional   Drug use: No   Family History  Problem Relation Age of Onset   Obesity Mother    Cancer Father        renal cell   Parkinsonism Father    Allergies  Allergen Reactions   Bee Venom Anaphylaxis   Current  Outpatient Medications on File Prior to Visit  Medication Sig Dispense Refill   amLODipine (NORVASC) 5 MG tablet Take 1 tablet (5 mg total) by mouth daily. 90 tablet 3   escitalopram (LEXAPRO) 20 MG tablet Take 1 tablet (20 mg total) by mouth daily. 90 tablet 2   hydrOXYzine (ATARAX) 25 MG tablet Take 1 tablet (25 mg total) by mouth at bedtime as needed for itching. 20 tablet 0   montelukast (SINGULAIR) 10 MG tablet TAKE 1 TABLET BY MOUTH EVERYDAY AT BEDTIME 90 tablet 3   propranolol (INDERAL) 20 MG tablet Take 1 tablet (20 mg total) by mouth 3 (three) times daily. 270 tablet 3   traZODone (DESYREL) 50 MG tablet Take 1 tablet (50 mg total) by mouth at bedtime as needed for sleep. 30 tablet 3   No current facility-administered medications on file prior to visit.    Review of Systems  Constitutional:  Positive for appetite change and fatigue. Negative for fever.  HENT:  Positive for congestion, ear pain, postnasal drip, rhinorrhea, sinus pressure and sore throat. Negative for nosebleeds.   Eyes:  Negative for  pain, redness and itching.  Respiratory:  Positive for cough. Negative for shortness of breath and wheezing.   Cardiovascular:  Negative for chest pain.  Gastrointestinal:  Negative for abdominal pain, diarrhea, nausea and vomiting.  Endocrine: Negative for polyuria.  Genitourinary:  Negative for dysuria, frequency and urgency.  Musculoskeletal:  Negative for arthralgias and myalgias.  Skin:  Positive for rash.  Allergic/Immunologic: Negative for immunocompromised state.  Neurological:  Positive for headaches. Negative for dizziness, tremors, syncope, weakness and numbness.  Hematological:  Negative for adenopathy. Does not bruise/bleed easily.  Psychiatric/Behavioral:  Negative for dysphoric mood. The patient is not nervous/anxious.        Objective:   Physical Exam Constitutional:      General: She is not in acute distress.    Appearance: Normal appearance. She is well-developed. She is obese. She is not ill-appearing.  HENT:     Head: Normocephalic and atraumatic.     Comments: Bilateral maxillary and frontal sinus tenderness  (worse in maxillary area)    Right Ear: Tympanic membrane and external ear normal.     Left Ear: Tympanic membrane and external ear normal.     Nose: Congestion and rhinorrhea present.     Mouth/Throat:     Pharynx: Oropharynx is clear. No oropharyngeal exudate or posterior oropharyngeal erythema.     Comments: Clear pnd Eyes:     General:        Right eye: No discharge.        Left eye: No discharge.     Conjunctiva/sclera: Conjunctivae normal.     Pupils: Pupils are equal, round, and reactive to light.  Cardiovascular:     Rate and Rhythm: Normal rate and regular rhythm.  Pulmonary:     Effort: Pulmonary effort is normal. No respiratory distress.     Breath sounds: Normal breath sounds. No stridor. No wheezing, rhonchi or rales.     Comments: Good air exch No rales or rhonchi No wheeze even on forced expiration  Musculoskeletal:     Cervical  back: Normal range of motion and neck supple.  Lymphadenopathy:     Cervical: No cervical adenopathy.  Skin:    General: Skin is warm and dry.     Findings: No rash.     Comments: 3 by 2.5 cm oval area of erythema with scale in R post scalp/ at edge  of hair line (does not extend far into scalp) Difficult to tell if there are prior vesicles that are now dry  No plaques  Not tender today  No excoriation    Neurological:     Mental Status: She is alert.     Cranial Nerves: No cranial nerve deficit.     Coordination: Coordination normal.  Psychiatric:        Mood and Affect: Mood normal.           Assessment & Plan:   Problem List Items Addressed This Visit       Respiratory   Acute sinusitis - Primary    2 wk into uri  Facial pain /purulent nasal drainage  Tenderness on exam  Tx with augmentin  Sympt care-see AVS Er precautions noted  Update if not starting to improve in a week or if worsening        Relevant Medications   amoxicillin-clavulanate (AUGMENTIN) 875-125 MG tablet   Asthma    Doing better despite recent uri   Has been off symbicort since fall (after losing her ins for a while) Does not feel she needs it  Removed from med list Inst to call if symptoms worsen/ if she thinks she needs to re start Albuterol sent in to have on hand (has not needed) Continues singulair      Relevant Medications   albuterol (VENTOLIN HFA) 108 (90 Base) MCG/ACT inhaler     Musculoskeletal and Integument   Rash of neck    Right post neck in hair line Is resolving- has scale/ cannot r/o resolving zoster (pain was not severe) , eczema and mild/early psoriasis are also in the differential  Prone to poison ivy- but no recent exp that she knows of  Inst to keep clean gently  Triamcinolone for itch prn  Update if not starting to improve in a week or if worsening          Other   Morbid obesity    Pt was doing well then lost ins and moved  Wants to get back on track   Mounjaro worked well prior along with the healthy wt clinic She plans to return   Enc her to get back on track with healthy eating now that she is settling in to new place       Other Visit Diagnoses     Chest congestion       Relevant Orders   POC COVID-19 (Completed)   Rash       Relevant Medications   triamcinolone cream (KENALOG) 0.1 %   Wheeze       Relevant Medications   albuterol (VENTOLIN HFA) 108 (90 Base) MCG/ACT inhaler

## 2023-01-13 NOTE — Patient Instructions (Addendum)
Keep the rash area clean with soap and water (gently)  Dry it well  Triamcinolone may help itch Cannot say whether this was shingles to begin with -too late to tell now  Could also be eczema   Continue allergy medicines  For sinus infection - take augmentin  Nasal saline spray or irrigation Breathe steam for congestion also   Update if not starting to improve in a week or if worsening    Stay off symbicort for now  I sent albuterol to have on hand   If asthma worsens please let me know

## 2023-01-13 NOTE — Assessment & Plan Note (Addendum)
Right post neck in hair line Is resolving- has scale/ cannot r/o resolving zoster (pain was not severe) , eczema and mild/early psoriasis are also in the differential  Prone to poison ivy- but no recent exp that she knows of  Inst to keep clean gently  Triamcinolone for itch prn  Update if not starting to improve in a week or if worsening

## 2023-01-13 NOTE — Assessment & Plan Note (Signed)
Doing better despite recent uri   Has been off symbicort since fall (after losing her ins for a while) Does not feel she needs it  Removed from med list Inst to call if symptoms worsen/ if she thinks she needs to re start Albuterol sent in to have on hand (has not needed) Continues singulair

## 2023-01-25 DIAGNOSIS — Z0289 Encounter for other administrative examinations: Secondary | ICD-10-CM

## 2023-01-28 ENCOUNTER — Ambulatory Visit (INDEPENDENT_AMBULATORY_CARE_PROVIDER_SITE_OTHER): Payer: 59 | Admitting: Adult Health

## 2023-01-28 ENCOUNTER — Encounter (INDEPENDENT_AMBULATORY_CARE_PROVIDER_SITE_OTHER): Payer: Self-pay | Admitting: Adult Health

## 2023-01-28 VITALS — BP 139/77 | HR 73 | Temp 98.7°F | Ht 67.0 in | Wt 292.0 lb

## 2023-01-28 DIAGNOSIS — I10 Essential (primary) hypertension: Secondary | ICD-10-CM

## 2023-01-28 DIAGNOSIS — R632 Polyphagia: Secondary | ICD-10-CM

## 2023-01-28 DIAGNOSIS — E669 Obesity, unspecified: Secondary | ICD-10-CM

## 2023-01-28 DIAGNOSIS — E8881 Metabolic syndrome: Secondary | ICD-10-CM

## 2023-01-28 DIAGNOSIS — E66813 Obesity, class 3: Secondary | ICD-10-CM

## 2023-01-28 DIAGNOSIS — Z6841 Body Mass Index (BMI) 40.0 and over, adult: Secondary | ICD-10-CM

## 2023-01-28 NOTE — Progress Notes (Addendum)
WEIGHT SUMMARY AND BIOMETRICS  Vitals Temp: 98.7 F (37.1 C) BP: 139/77 Pulse Rate: 73 SpO2: 97 %   Anthropometric Measurements Height: 5\' 7"  (1.702 m) Weight: 292 lb (132.5 kg) BMI (Calculated): 45.72 Weight at Last Visit: 275lb Weight Lost Since Last Visit: 0 Weight Gained Since Last Visit: 17lb Starting Weight: 290lb Total Weight Loss (lbs): 0 lb (0 kg)   Body Composition  Body Fat %: 49.4 % Fat Mass (lbs): 144.4 lbs Muscle Mass (lbs): 140.4 lbs Total Body Water (lbs): 104 lbs Visceral Fat Rating : 16   Other Clinical Data Fasting: yes Labs: no Today's Visit #: 31 Starting Date: 03/21/20    Chief Complaint:   OBESITY Alexa Anderson is here to discuss her progress with her obesity treatment plan. She is on the practicing portion control and making smarter food choices, such as increasing vegetables and decreasing simple carbohydrates and states she is following her eating plan approximately 0 % of the time. She states she is not currently exercising.   Interim History:  Ms. Claes was last seen at [facility] with S. Tickerhoff, FNP-C 03/25/2022 Lost to f/u  Her PCP/Dr. Milinda Antis has assumed managing all of her maintenance mediations that were previously handled by [facility].  Ms. Licht endorses emotional eating, ie: when stressed at work last week she responded with consuming- [employer] Big mac Meal.  She believes she has been experiencing emotional binge eating behavior since the death of her father in 80.  Reviewed Bioimpedance with pt: Muscle Mass: + 11 lbs Adipose Mass: + 5 lbs  She states "I need the support and accountability of this clinic".  Subjective:   1. Hypertension, unspecified type BP slightly above goal. She is currently on: propranolol (INDERAL) 20 MG tablet  amLODipine (NORVASC) 5 MG tablet   2. Metabolic syndrome + HTN + Morbid Obesity + IFG She has been on Wegovy and Mounjaro therapy in past and tolerated both well. She felt that Wegovy  worked best for her and if her insurance will ever approve- she would like to resume injectable therapy  3. Polyphagia She believes she has been experiencing emotional binge eating behavior since the death of her father in 50. She has been on [place] therapy in past and tolerated both well. She felt that Wegovy worked best for her and if her insurance will ever approve- she would like to resume injectable therapy. She is agreeable to referral to Dr. Excell Seltzer  Assessment/Plan:   1. Hypertension, unspecified type Resume weight loss efforts Continue: propranolol (INDERAL) 20 MG tablet  amLODipine (NORVASC) 5 MG tablet   2. Metabolic syndrome Resume weight loss efforts  3. Polyphagia Referral to Dr. Dewaine Conger Check IC and fasting labs at next OV  4. Obesity, current BMI 45.72  Roshandra is not currently in the action stage of change. As such, her goal is to get back to weightloss efforts . She has agreed to practicing portion control and making smarter food choices, such as increasing vegetables and decreasing simple carbohydrates.   Exercise goals: All adults should avoid inactivity. Some physical activity is better than none, and adults who participate in any amount of physical activity gain some health benefits.  Behavioral modification strategies: increasing lean protein intake, decreasing simple carbohydrates, increasing vegetables, increasing water intake, decreasing liquid calories, decreasing eating out, no skipping meals, meal planning and cooking strategies, keeping healthy foods in the home, better snacking choices, and planning for success.  Jaydy has agreed to follow-up with our clinic  in 2 weeks. She was informed of the importance of frequent follow-up visits to maximize her success with intensive lifestyle modifications for her multiple health conditions.   Check IC and Fasting Labs at necxt OV- pt aware to arrive 30 mins prior to OV and to be fasting.  Objective:    Blood pressure 139/77, pulse 73, temperature 98.7 F (37.1 C), height 5\' 7"  (1.702 m), weight 292 lb (132.5 kg), SpO2 97 %. Body mass index is 45.73 kg/m.  General: Cooperative, alert, well developed, in no acute distress. HEENT: Conjunctivae and lids unremarkable. Cardiovascular: Regular rhythm.  Lungs: Normal work of breathing. Neurologic: No focal deficits.   Lab Results  Component Value Date   CREATININE 0.64 03/09/2022   BUN 13 03/09/2022   NA 140 03/09/2022   K 3.9 03/09/2022   CL 103 03/09/2022   CO2 23 03/09/2022   Lab Results  Component Value Date   ALT 25 03/09/2022   AST 18 03/09/2022   ALKPHOS 122 (H) 03/09/2022   BILITOT 0.4 03/09/2022   Lab Results  Component Value Date   HGBA1C 5.3 03/09/2022   HGBA1C 5.1 05/09/2021   HGBA1C 5.5 01/02/2021   HGBA1C 5.4 03/21/2020   HGBA1C 5.3 09/05/2013   Lab Results  Component Value Date   INSULIN 7.9 03/09/2022   INSULIN 9.2 01/02/2021   INSULIN 8.9 03/21/2020   Lab Results  Component Value Date   TSH 1.85 05/09/2021   Lab Results  Component Value Date   CHOL 156 05/09/2021   HDL 49.70 05/09/2021   LDLCALC 96 05/09/2021   TRIG 54.0 05/09/2021   CHOLHDL 3 05/09/2021   Lab Results  Component Value Date   VD25OH 32.6 01/02/2021   VD25OH 39.8 03/21/2020   Lab Results  Component Value Date   WBC 7.5 05/09/2021   HGB 13.1 05/09/2021   HCT 39.0 05/09/2021   MCV 86.8 05/09/2021   PLT 273.0 05/09/2021   Lab Results  Component Value Date   IRON 64 01/02/2021   TIBC 269 01/02/2021   FERRITIN 37 01/02/2021   Attestation Statements:   Reviewed by clinician on day of visit: allergies, medications, problem list, medical history, surgical history, family history, social history, and previous encounter notes.  Time spent on visit including pre-visit chart review and post-visit care and charting was 32 minutes.   I have reviewed the above documentation for accuracy and completeness, and I agree with the  above. -  Audie Stayer d. Christiano Blandon, NP-C

## 2023-02-18 ENCOUNTER — Other Ambulatory Visit: Payer: Self-pay | Admitting: Family Medicine

## 2023-02-18 ENCOUNTER — Other Ambulatory Visit: Payer: Self-pay

## 2023-02-18 ENCOUNTER — Other Ambulatory Visit (HOSPITAL_BASED_OUTPATIENT_CLINIC_OR_DEPARTMENT_OTHER): Payer: Self-pay

## 2023-02-18 MED ORDER — AMLODIPINE BESYLATE 5 MG PO TABS
5.0000 mg | ORAL_TABLET | Freq: Every day | ORAL | 0 refills | Status: DC
  Filled 2023-02-18: qty 30, 30d supply, fill #0
  Filled 2023-04-08: qty 30, 30d supply, fill #1
  Filled 2023-05-09: qty 30, 30d supply, fill #2

## 2023-02-18 NOTE — Telephone Encounter (Signed)
Please schedule annual exam (if she wants to discuss health mt) or follow up if she has new issues and chronic problems  Thanks

## 2023-02-18 NOTE — Telephone Encounter (Signed)
Pt had recent acute appts but last f/u was 02/02/22, no future appts., please advise

## 2023-02-19 NOTE — Telephone Encounter (Signed)
Patient scheduled for cpe and labs. 

## 2023-02-22 ENCOUNTER — Encounter (INDEPENDENT_AMBULATORY_CARE_PROVIDER_SITE_OTHER): Payer: Self-pay | Admitting: Adult Health

## 2023-02-22 ENCOUNTER — Encounter (INDEPENDENT_AMBULATORY_CARE_PROVIDER_SITE_OTHER): Payer: Self-pay

## 2023-02-22 ENCOUNTER — Ambulatory Visit (INDEPENDENT_AMBULATORY_CARE_PROVIDER_SITE_OTHER): Payer: 59 | Admitting: Adult Health

## 2023-02-22 VITALS — BP 143/87 | HR 71 | Temp 98.4°F | Ht 67.0 in | Wt 300.0 lb

## 2023-02-22 DIAGNOSIS — I10 Essential (primary) hypertension: Secondary | ICD-10-CM

## 2023-02-22 DIAGNOSIS — Z6841 Body Mass Index (BMI) 40.0 and over, adult: Secondary | ICD-10-CM

## 2023-02-22 DIAGNOSIS — E8881 Metabolic syndrome: Secondary | ICD-10-CM

## 2023-02-23 ENCOUNTER — Telehealth (INDEPENDENT_AMBULATORY_CARE_PROVIDER_SITE_OTHER): Payer: 59 | Admitting: Psychology

## 2023-02-23 ENCOUNTER — Encounter (INDEPENDENT_AMBULATORY_CARE_PROVIDER_SITE_OTHER): Payer: Self-pay

## 2023-02-23 NOTE — Progress Notes (Signed)
PT REFUSED TO BE SEEN AND LEFT

## 2023-02-25 ENCOUNTER — Other Ambulatory Visit (HOSPITAL_BASED_OUTPATIENT_CLINIC_OR_DEPARTMENT_OTHER): Payer: Self-pay

## 2023-03-11 ENCOUNTER — Telehealth: Payer: Self-pay | Admitting: Family Medicine

## 2023-03-11 DIAGNOSIS — R7309 Other abnormal glucose: Secondary | ICD-10-CM

## 2023-03-11 DIAGNOSIS — I10 Essential (primary) hypertension: Secondary | ICD-10-CM

## 2023-03-11 NOTE — Telephone Encounter (Signed)
-----   Message from Terri J Walsh sent at 02/22/2023  3:46 PM EDT ----- Regarding: Lab orders for Friday, 6.7.24 Patient is scheduled for CPX labs, please order future labs, Thanks , Terri   

## 2023-03-12 ENCOUNTER — Other Ambulatory Visit: Payer: 59

## 2023-03-15 ENCOUNTER — Other Ambulatory Visit: Payer: 59

## 2023-03-17 ENCOUNTER — Other Ambulatory Visit: Payer: 59

## 2023-03-19 ENCOUNTER — Encounter: Payer: 59 | Admitting: Family Medicine

## 2023-03-22 ENCOUNTER — Encounter: Payer: 59 | Admitting: Family Medicine

## 2023-03-30 ENCOUNTER — Other Ambulatory Visit (HOSPITAL_BASED_OUTPATIENT_CLINIC_OR_DEPARTMENT_OTHER): Payer: Self-pay

## 2023-03-30 ENCOUNTER — Ambulatory Visit (INDEPENDENT_AMBULATORY_CARE_PROVIDER_SITE_OTHER): Payer: 59 | Admitting: Family Medicine

## 2023-03-30 ENCOUNTER — Encounter: Payer: Self-pay | Admitting: Family Medicine

## 2023-03-30 VITALS — BP 132/80 | HR 68 | Ht 67.0 in | Wt 299.8 lb

## 2023-03-30 DIAGNOSIS — Z1211 Encounter for screening for malignant neoplasm of colon: Secondary | ICD-10-CM

## 2023-03-30 DIAGNOSIS — Z0001 Encounter for general adult medical examination with abnormal findings: Secondary | ICD-10-CM

## 2023-03-30 DIAGNOSIS — F418 Other specified anxiety disorders: Secondary | ICD-10-CM

## 2023-03-30 DIAGNOSIS — I10 Essential (primary) hypertension: Secondary | ICD-10-CM | POA: Diagnosis not present

## 2023-03-30 DIAGNOSIS — F430 Acute stress reaction: Secondary | ICD-10-CM

## 2023-03-30 DIAGNOSIS — N852 Hypertrophy of uterus: Secondary | ICD-10-CM

## 2023-03-30 DIAGNOSIS — R7309 Other abnormal glucose: Secondary | ICD-10-CM | POA: Diagnosis not present

## 2023-03-30 DIAGNOSIS — E049 Nontoxic goiter, unspecified: Secondary | ICD-10-CM

## 2023-03-30 DIAGNOSIS — Z1231 Encounter for screening mammogram for malignant neoplasm of breast: Secondary | ICD-10-CM

## 2023-03-30 LAB — COMPREHENSIVE METABOLIC PANEL
ALT: 15 U/L (ref 0–35)
AST: 16 U/L (ref 0–37)
Albumin: 4.3 g/dL (ref 3.5–5.2)
Alkaline Phosphatase: 110 U/L (ref 39–117)
BUN: 16 mg/dL (ref 6–23)
CO2: 26 mEq/L (ref 19–32)
Calcium: 9.4 mg/dL (ref 8.4–10.5)
Chloride: 103 mEq/L (ref 96–112)
Creatinine, Ser: 0.62 mg/dL (ref 0.40–1.20)
GFR: 102.64 mL/min (ref >60.00)
Glucose, Bld: 94 mg/dL (ref 70–99)
Potassium: 4.1 mEq/L (ref 3.5–5.1)
Sodium: 138 mEq/L (ref 135–145)
Total Bilirubin: 0.8 mg/dL (ref 0.2–1.2)
Total Protein: 7.3 g/dL (ref 6.0–8.3)

## 2023-03-30 LAB — CBC WITH DIFFERENTIAL/PLATELET
Basophils Absolute: 0 10*3/uL (ref 0.0–0.1)
Basophils Relative: 0.6 % (ref 0.0–3.0)
Eosinophils Absolute: 0.2 10*3/uL (ref 0.0–0.7)
Eosinophils Relative: 3.3 % (ref 0.0–5.0)
HCT: 40.5 % (ref 36.0–46.0)
Hemoglobin: 13.4 g/dL (ref 12.0–15.0)
Lymphocytes Relative: 30 % (ref 12.0–46.0)
Lymphs Abs: 1.7 10*3/uL (ref 0.7–4.0)
MCHC: 33 g/dL (ref 30.0–36.0)
MCV: 85.5 fl (ref 78.0–100.0)
Monocytes Absolute: 0.5 10*3/uL (ref 0.1–1.0)
Monocytes Relative: 8.7 % (ref 3.0–12.0)
Neutro Abs: 3.3 10*3/uL (ref 1.4–7.7)
Neutrophils Relative %: 57.4 % (ref 43.0–77.0)
Platelets: 253 10*3/uL (ref 150.0–400.0)
RBC: 4.74 Mil/uL (ref 3.87–5.11)
RDW: 14.2 % (ref 11.5–15.5)
WBC: 5.8 10*3/uL (ref 4.0–10.5)

## 2023-03-30 LAB — LIPID PANEL
Cholesterol: 158 mg/dL (ref 0–200)
HDL: 50.5 mg/dL (ref >39.00)
LDL Cholesterol: 96 mg/dL (ref 0–99)
NonHDL: 107.7
Total CHOL/HDL Ratio: 3
Triglycerides: 59 mg/dL (ref 0.0–149.0)
VLDL: 11.8 mg/dL (ref 0.0–40.0)

## 2023-03-30 LAB — HEMOGLOBIN A1C: Hgb A1c MFr Bld: 5.5 % (ref 4.6–6.5)

## 2023-03-30 LAB — TSH: TSH: 2.18 u[IU]/mL (ref 0.35–5.50)

## 2023-03-30 MED ORDER — FLUOXETINE HCL 20 MG PO TABS
20.0000 mg | ORAL_TABLET | Freq: Every day | ORAL | 3 refills | Status: DC
  Filled 2023-03-30: qty 30, 30d supply, fill #0

## 2023-03-30 NOTE — Assessment & Plan Note (Addendum)
After discussion of options - cologuard kit was ordered

## 2023-03-30 NOTE — Patient Instructions (Addendum)
Stop lexapro  Change to generic prozac - keep taking in the am   Update if side effects or problems  Let me know  Hold medication    Follow up in 1-2 months  We may want to consider counseling in the future again   Try to get 1200-1500 mg of calcium per day with at least 2000 iu of vitamin D - for bone health If calcium constipates you- just take the vitamin D  Add some strength training to your routine, this is important for bone and brain health and can reduce your risk of falls and help your body use insulin properly and regulate weight  Light weights, exercise bands , and internet videos are a good way to start  Yoga (chair or regular), machines , floor exercises or a gym with machines are also good options      You have an order for:  []   2D Mammogram  [x]   3D Mammogram  []   Bone Density     Please call for appointment:   []   [facility]  [address] [place] [address]  [phone]  []   [facility])   [address]  [place], [place] [address]  [phone]  [x]   [facility]      [address] [place], [place]        [phone]         []   [facility]  [address] [place], [place]  [phone]  []  [facility]   [address]   [place], [place] [address]  [phone]  []  [facility]  [address] [place], [place] [address] [phone]    Make sure to wear two piece clothing  No lotions powders or deodorants the day of the appointment Make sure to bring picture ID and insurance card.  Bring list of medications you are currently taking including any supplements.   Schedule your screening mammogram through MyChart!   Select [place] imaging sites can now be scheduled through  MyChart.  Log into your MyChart account.  Go to 'Visit' (or 'Appointments' if  on mobile App) --> Schedule an  Appointment  Under 'Select a Reason for Visit' choose the Mammogram  Screening option.  Complete the pre-visit questions  and select the time and place that  best fits your schedule

## 2023-03-30 NOTE — Assessment & Plan Note (Signed)
This has worsened  Long history of this and no longer in counseling Paxil caused weight gain and trazodone did not help sleep  Pt thinks lexapro causes weight gain and is not helping her mood  Phq of 8 Discussed trial of fluoxetine to see if more weight neutral Discussed expectations of SSRI medication including time to effectiveness and mechanism of action, also poss of side effects (early and late)- including mental fuzziness, weight or appetite change, nausea and poss of worse dep or anxiety (even suicidal thoughts)  Pt voiced understanding and will stop med and update if this occurs   Follow up planned

## 2023-03-30 NOTE — Assessment & Plan Note (Signed)
Mammogrm ordered Pt will call to schedule

## 2023-03-30 NOTE — Assessment & Plan Note (Signed)
Worse depression  Will change to fluoxetine  Consider return to counseling

## 2023-03-30 NOTE — Assessment & Plan Note (Signed)
bp in fair control at this time  BP Readings from Last 1 Encounters:  03/30/23 132/80   No changes needed Most recent labs reviewed  Disc lifstyle change with low sodium diet and exercise   Propranolol 20 mg tid Amlodipine 5 mg daily

## 2023-03-30 NOTE — Assessment & Plan Note (Addendum)
With worse depressoin today Reviewed health habits including diet and exercise and skin cancer prevention Reviewed appropriate screening tests for age  Also reviewed health mt list, fam hx and immunization status , as well as social and family history   See HPI Labs reviewed and ordered Declines shingrix vaccine  Mammogram ordered  Cologuard test ordered  Discussed bone health and need for ca and D and exercise  Sees gyn for pap (pap neg 2021)  Changed therapy for depressoin to fluoxeitine / PHQ score of 8

## 2023-03-30 NOTE — Assessment & Plan Note (Signed)
Lab Results  Component Value Date   TSH 2.18 03/30/2023   Per pt no clinical changes

## 2023-03-30 NOTE — Progress Notes (Signed)
Subjective:    Patient ID: Alexa Anderson, female    DOB: [birth year], 52 y.o.   MRN: [id number]  HPI  Here for health maintenance exam and to review chronic medical problems   Wt Readings from Last 3 Encounters:  03/30/23 299 lb 12.8 oz (136 kg)  02/22/23 300 lb (136.1 kg)  01/28/23 292 lb (132.5 kg)   46.96 kg/m  Vitals:   03/30/23 0834  BP: 132/80  Pulse: 68  SpO2: 98%   Weight still really bothers her  She did not do well with the healthy weight clinic  Has an on line program through work  Signed for a virtual nutrition visit through work     Immunization History  Administered Date(s) Administered   PFIZER(Purple Top)SARS-COV-2 Vaccination 01/05/2020, 02/02/2020   Tdap 09/05/2013    Health Maintenance Due  Topic Date Due   Colonoscopy  Never done   MAMMOGRAM  06/11/2017   PAP SMEAR-Modifier  02/07/2023   Shingrix - declines for now  May have had shingles    Mammogram 06/2016 -wants to schedule  Self breast exam-no lumps   Gyn care/ pap -pap 02/2020 negative    Colon cancer screening    Bone health  Falls-none Fractures-none  Supplements -none  Exercise - on work days over 10,000 steps   Drinks milk    Mood History of anx and dep Has had a counselor   Lexapro does not seem to be helping/ may be causing problems losing weight  No longer sees a counselor    Took paxil years ago -weight gain was bad and no energy and headaches  Did not help mood    Trazodone prn sleep- does not use often       03/30/2023    8:36 AM 04/21/2022   10:07 AM 02/02/2022    3:40 PM 05/09/2021    8:42 AM 03/21/2020    7:40 AM  Depression screen PHQ 2/9  Decreased Interest 1 0 0 0 1  Down, Depressed, Hopeless 1 0 0 0 2  PHQ - 2 Score 2 0 0 0 3  Altered sleeping 1   0 2  Tired, decreased energy 2   1 1   Change in appetite 2   1 2   Feeling bad or failure about yourself  1   0 1  Trouble concentrating 0   0 1  Moving slowly or fidgety/restless 0   0 0   Suicidal thoughts 0   0 0  PHQ-9 Score 8   2 10   Difficult doing work/chores Not difficult at all   Not difficult at all Not difficult at all     HTN bp is stable today  No cp or palpitations or headaches or edema  No side effects to medicines  BP Readings from Last 3 Encounters:  03/30/23 132/80  02/22/23 (!) 143/87  01/28/23 139/77     Propanolol 20 mg bid- no longer taking  (she took it in stressful situation )- is prn  Amlodipine 5 mg daily   Pulse Readings from Last 3 Encounters:  03/30/23 68  02/22/23 71  01/28/23 73     Lab Results  Component Value Date   NA 138 03/30/2023   K 4.1 03/30/2023   CO2 26 03/30/2023   GLUCOSE 94 03/30/2023   BUN 16 03/30/2023   CREATININE 0.62 03/30/2023   CALCIUM 9.4 03/30/2023   GFR 102.64 03/30/2023   EGFR 107 03/09/2022   GFRNONAA 102 03/21/2020  History of goiter Lab Results  Component Value Date   TSH 2.18 03/30/2023    Other labs Lab Results  Component Value Date   WBC 5.8 03/30/2023   HGB 13.4 03/30/2023   HCT 40.5 03/30/2023   MCV 85.5 03/30/2023   PLT 253.0 03/30/2023   Lab Results  Component Value Date   ALT 15 03/30/2023   AST 16 03/30/2023   ALKPHOS 110 03/30/2023   BILITOT 0.8 03/30/2023   Last vitamin D Lab Results  Component Value Date   VD25OH 32.6 01/02/2021   Lab Results  Component Value Date   VITAMINB12 472 01/02/2021   Lab Results  Component Value Date   HGBA1C 5.5 03/30/2023       Patient Active Problem List   Diagnosis Date Noted   Asthma 02/02/2022   Colon cancer screening 05/09/2021   Elevated glucose level 03/12/2020   Enlarged uterus 02/19/2020   Enlarged ovary 02/19/2020   Amenorrhea 12/12/2018   Tinnitus 10/24/2018   Encounter for screening mammogram for breast cancer 05/12/2016   Encounter for general adult medical examination with abnormal findings 09/05/2013   Encounter for routine gynecological examination 09/05/2013   Stress reaction 09/05/2013    Goiter 06/05/2008   Morbid obesity (HCC) 06/04/2008   Depression with anxiety 06/04/2008   Migraine without aura 06/04/2008   Essential hypertension 06/04/2008   Past Medical History:  Diagnosis Date   Anxiety    Depression    Goiter    History of depression    Hypertension    Migraines    Obesity    Weight loss    use to wiegh over 400 lbs   Past Surgical History:  Procedure Laterality Date   CESAREAN SECTION     thyroid ultrasound  11/05/2010   stable 5 mm nodule hypoechoic on left   TOOTH EXTRACTION  05/2022   WISDOM TOOTH EXTRACTION     Social History   Tobacco Use   Smoking status: Never   Smokeless tobacco: Never  Vaping Use   Vaping Use: Never used  Substance Use Topics   Alcohol use: Yes    Comment: Occasional   Drug use: No   Family History  Problem Relation Age of Onset   Obesity Mother    Cancer Father        renal cell   Parkinsonism Father    Allergies  Allergen Reactions   Bee Venom Anaphylaxis   Current Outpatient Medications on File Prior to Visit  Medication Sig Dispense Refill   amLODipine (NORVASC) 5 MG tablet Take 1 tablet (5 mg total) by mouth daily. 90 tablet 0   traZODone (DESYREL) 50 MG tablet Take 1 tablet (50 mg total) by mouth at bedtime as needed for sleep. 30 tablet 3   albuterol (VENTOLIN HFA) 108 (90 Base) MCG/ACT inhaler Inhale 1-2 puffs into the lungs every 6 (six) hours as needed for wheezing or shortness of breath. (Patient not taking: Reported on 03/30/2023) 8.5 g 3   propranolol (INDERAL) 20 MG tablet Take 1 tablet (20 mg total) by mouth 3 (three) times daily. (Patient taking differently: Take 20 mg by mouth 3 (three) times daily as needed (anxious symptoms).) 270 tablet 3   triamcinolone cream (KENALOG) 0.1 % Apply 1 Application topically 2 (two) times daily. To affected area (Patient not taking: Reported on 03/30/2023) 30 g 0   No current facility-administered medications on file prior to visit.    Review of Systems   Constitutional:  Positive for fatigue.  Negative for activity change, appetite change, fever and unexpected weight change.  HENT:  Negative for congestion, ear pain, rhinorrhea, sinus pressure and sore throat.   Eyes:  Negative for pain, redness and visual disturbance.  Respiratory:  Negative for cough, shortness of breath and wheezing.   Cardiovascular:  Negative for chest pain and palpitations.  Gastrointestinal:  Negative for abdominal pain, blood in stool, constipation and diarrhea.  Endocrine: Negative for polydipsia and polyuria.  Genitourinary:  Negative for dysuria, frequency and urgency.  Musculoskeletal:  Positive for arthralgias. Negative for back pain and myalgias.  Skin:  Negative for pallor and rash.  Allergic/Immunologic: Negative for environmental allergies.  Neurological:  Negative for dizziness, syncope and headaches.  Hematological:  Negative for adenopathy. Does not bruise/bleed easily.  Psychiatric/Behavioral:  Positive for dysphoric mood and sleep disturbance. Negative for decreased concentration. The patient is nervous/anxious.        Objective:   Physical Exam Constitutional:      General: She is not in acute distress.    Appearance: Normal appearance. She is well-developed. She is obese. She is not ill-appearing or diaphoretic.  HENT:     Head: Normocephalic and atraumatic.     Right Ear: Tympanic membrane, ear canal and external ear normal.     Left Ear: Tympanic membrane, ear canal and external ear normal.     Nose: Nose normal. No congestion.     Mouth/Throat:     Mouth: Mucous membranes are moist.     Pharynx: Oropharynx is clear. No posterior oropharyngeal erythema.  Eyes:     General: No scleral icterus.    Extraocular Movements: Extraocular movements intact.     Conjunctiva/sclera: Conjunctivae normal.     Pupils: Pupils are equal, round, and reactive to light.  Neck:     Thyroid: No thyromegaly.     Vascular: No carotid bruit or JVD.   Cardiovascular:     Rate and Rhythm: Normal rate and regular rhythm.     Pulses: Normal pulses.     Heart sounds: Normal heart sounds.     No gallop.  Pulmonary:     Effort: Pulmonary effort is normal. No respiratory distress.     Breath sounds: Normal breath sounds. No wheezing.     Comments: Good air exch Chest:     Chest wall: No tenderness.  Abdominal:     General: Bowel sounds are normal. There is no distension or abdominal bruit.     Palpations: Abdomen is soft. There is no mass.     Tenderness: There is no abdominal tenderness.     Hernia: No hernia is present.  Genitourinary:    Comments: Breast and pelvic exam are done by gyn provider   Musculoskeletal:        General: No tenderness. Normal range of motion.     Cervical back: Normal range of motion and neck supple. No rigidity. No muscular tenderness.     Right lower leg: No edema.     Left lower leg: No edema.     Comments: No kyphosis   Lymphadenopathy:     Cervical: No cervical adenopathy.  Skin:    General: Skin is warm and dry.     Coloration: Skin is not pale.     Findings: No erythema or rash.     Comments: Solar lentigines diffusely   Neurological:     Mental Status: She is alert. Mental status is at baseline.     Cranial Nerves: No cranial nerve deficit.  Motor: No abnormal muscle tone.     Coordination: Coordination normal.     Gait: Gait normal.     Deep Tendon Reflexes: Reflexes are normal and symmetric. Reflexes normal.  Psychiatric:        Attention and Perception: Attention normal.        Mood and Affect: Mood is depressed. Affect is tearful.        Speech: Speech normal.        Thought Content: Thought content does not include suicidal ideation.        Cognition and Memory: Cognition and memory normal.           Assessment & Plan:   Problem List Items Addressed This Visit       Cardiovascular and Mediastinum   Essential hypertension    bp in fair control at this time  BP  Readings from Last 1 Encounters:  03/30/23 132/80  No changes needed Most recent labs reviewed  Disc lifstyle change with low sodium diet and exercise   Propranolol 20 mg tid Amlodipine 5 mg daily      Relevant Orders   TSH (Completed)   Lipid panel (Completed)   Comprehensive metabolic panel (Completed)   CBC with Differential/Platelet (Completed)     Endocrine   Goiter    Lab Results  Component Value Date   TSH 2.18 03/30/2023  Per pt no clinical changes         Other   Stress reaction    Worse depression  Will change to fluoxetine  Consider return to counseling       Relevant Medications   FLUoxetine (PROZAC) 20 MG tablet   Morbid obesity (HCC)    Discussed how this problem influences overall health and the risks it imposes  Reviewed plan for weight loss with lower calorie diet (via better food choices and also portion control or program like weight watchers) and exercise building up to or more than 30 minutes 5 days per week including some aerobic activity   Encouraged strength building exercise       Enlarged uterus    Fibroids Reviewed gyn note from 2022       Encounter for screening mammogram for breast cancer    Mammogrm ordered Pt will call to schedule       Relevant Orders   MM 3D SCREENING MAMMOGRAM BILATERAL BREAST   Encounter for general adult medical examination with abnormal findings - Primary    With worse depressoin today Reviewed health habits including diet and exercise and skin cancer prevention Reviewed appropriate screening tests for age  Also reviewed health mt list, fam hx and immunization status , as well as social and family history   See HPI Labs reviewed and ordered Declines shingrix vaccine  Mammogram ordered  Cologuard test ordered  Discussed bone health and need for ca and D and exercise  Sees gyn for pap (pap neg 2021)  Changed therapy for depressoin to fluoxeitine / PHQ score of 8       Elevated glucose level     Lab Results  Component Value Date   HGBA1C 5.5 03/30/2023  disc imp of low glycemic diet and wt loss to prevent DM2       Relevant Orders   Hemoglobin A1c (Completed)   Depression with anxiety    This has worsened  Long history of this and no longer in counseling Paxil caused weight gain and trazodone did not help sleep  Pt thinks lexapro causes  weight gain and is not helping her mood  Phq of 8 Discussed trial of fluoxetine to see if more weight neutral Discussed expectations of SSRI medication including time to effectiveness and mechanism of action, also poss of side effects (early and late)- including mental fuzziness, weight or appetite change, nausea and poss of worse dep or anxiety (even suicidal thoughts)  Pt voiced understanding and will stop med and update if this occurs   Follow up planned       Relevant Medications   FLUoxetine (PROZAC) 20 MG tablet   Colon cancer screening    After discussion of options - cologuard kit was ordered       Relevant Orders   Cologuard

## 2023-03-30 NOTE — Assessment & Plan Note (Signed)
Discussed how this problem influences overall health and the risks it imposes  Reviewed plan for weight loss with lower calorie diet (via better food choices and also portion control or program like weight watchers) and exercise building up to or more than 30 minutes 5 days per week including some aerobic activity  Encouraged strength building exercise  

## 2023-03-30 NOTE — Assessment & Plan Note (Signed)
Lab Results  Component Value Date   HGBA1C 5.5 03/30/2023   disc imp of low glycemic diet and wt loss to prevent DM2

## 2023-03-30 NOTE — Assessment & Plan Note (Signed)
Fibroids Reviewed gyn note from 2022

## 2023-04-05 ENCOUNTER — Encounter: Payer: Self-pay | Admitting: Family Medicine

## 2023-04-05 ENCOUNTER — Ambulatory Visit: Payer: 59 | Admitting: Family Medicine

## 2023-04-05 ENCOUNTER — Other Ambulatory Visit (HOSPITAL_BASED_OUTPATIENT_CLINIC_OR_DEPARTMENT_OTHER): Payer: Self-pay

## 2023-04-05 DIAGNOSIS — M546 Pain in thoracic spine: Secondary | ICD-10-CM | POA: Diagnosis not present

## 2023-04-05 DIAGNOSIS — I10 Essential (primary) hypertension: Secondary | ICD-10-CM

## 2023-04-05 DIAGNOSIS — F418 Other specified anxiety disorders: Secondary | ICD-10-CM | POA: Diagnosis not present

## 2023-04-05 DIAGNOSIS — M545 Low back pain, unspecified: Secondary | ICD-10-CM | POA: Insufficient documentation

## 2023-04-05 MED ORDER — IBUPROFEN 800 MG PO TABS
800.0000 mg | ORAL_TABLET | Freq: Three times a day (TID) | ORAL | 0 refills | Status: DC | PRN
Start: 2023-04-05 — End: 2023-04-21
  Filled 2023-04-05: qty 30, 10d supply, fill #0

## 2023-04-05 MED ORDER — CYCLOBENZAPRINE HCL 10 MG PO TABS
5.0000 mg | ORAL_TABLET | Freq: Every evening | ORAL | 0 refills | Status: DC | PRN
Start: 2023-04-05 — End: 2023-07-22
  Filled 2023-04-05: qty 10, 10d supply, fill #0
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
## 2023-04-06 ENCOUNTER — Ambulatory Visit (INDEPENDENT_AMBULATORY_CARE_PROVIDER_SITE_OTHER)
Admission: RE | Admit: 2023-04-06 | Discharge: 2023-04-06 | Disposition: A | Discharge status: Home / Self Care | Payer: 59 | Source: Ambulatory Visit | Attending: Family Medicine | Admitting: Family Medicine

## 2023-04-06 DIAGNOSIS — M546 Pain in thoracic spine: Secondary | ICD-10-CM

## 2023-04-06 DIAGNOSIS — M545 Low back pain, unspecified: Secondary | ICD-10-CM

## 2023-04-12 ENCOUNTER — Encounter: Payer: Self-pay | Admitting: Family Medicine

## 2023-04-12 ENCOUNTER — Telehealth: Payer: Self-pay | Admitting: *Deleted
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
## 2023-04-12 NOTE — Telephone Encounter (Signed)
Sent mychart letting pt know  ?

## 2023-04-12 NOTE — Telephone Encounter (Signed)
Please let her know I am not in town this week and schedule appt for when I return so I can review everything

## 2023-04-13 ENCOUNTER — Telehealth: Payer: Self-pay | Admitting: Family Medicine
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
## 2023-04-13 NOTE — Telephone Encounter (Signed)
Mychart message sent to pt as requested with work note attached excusing patient from work from 7/1-7/6.

## 2023-04-13 NOTE — Telephone Encounter (Signed)
Left patient a detailed voice message with annotation below and to return call to office

## 2023-04-18 ENCOUNTER — Ambulatory Visit
Admission: RE | Admit: 2023-04-18 | Discharge: 2023-04-18 | Disposition: A | Discharge status: Home / Self Care | Payer: 59 | Source: Ambulatory Visit | Attending: Internal Medicine | Admitting: Internal Medicine

## 2023-04-18 VITALS — BP 176/109 | HR 73 | Temp 98.6°F | Resp 18

## 2023-04-18 DIAGNOSIS — R21 Rash and other nonspecific skin eruption: Secondary | ICD-10-CM | POA: Diagnosis not present

## 2023-04-18 MED ORDER — VALACYCLOVIR HCL 1 G PO TABS
1000.0000 mg | ORAL_TABLET | Freq: Three times a day (TID) | ORAL | 0 refills | Status: AC
Start: 2023-04-18 — End: 2023-04-25

## 2023-04-18 NOTE — Discharge Instructions (Signed)
Start Valtrex 3 times a day for 7 days.  Follow-up with your PCP tomorrow for recheck.  If the rash comes close to your eye and/or over your eyelid/in your eye please go to the ER ASAP for treatment.  I hope you feel better soon!
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
## 2023-04-19 ENCOUNTER — Telehealth: Payer: Self-pay | Admitting: Family Medicine

## 2023-04-19 NOTE — Telephone Encounter (Signed)
Patient called in and stated that she went to urgent care over the weekend. She stated that she was diagnosed with Shingles. She stated that it is all over her face. She has one spot on her face by her eye and she was wanting to know if she needs to be seen earlier by Dr. Milinda Antis or if she can wait until Wednesday. She stated that she is in a lot pain and just took her last Ibuprofen and was wondering if Dr. Milinda Antis could send her some in. She was wanting to know if she could get a callback. Thank you!

## 2023-04-19 NOTE — Telephone Encounter (Signed)
I spoke with  pt; the shingles is at the corner of rt eye at cheek bone and is within tip of fingernail to pts eye. Pt said she was seen at [facility] on 04/18/23 and advised pt to see PCP on 04/19/23; no available appts and pt did see eye dr at [facility] [place] on 04/16/23 but did not have rash from shingles at that time.advised pt to call eye dr to see if they can see pt today and if not since shingles so close to eye advised pt to go to ED as did  the [facility]. Pt voiced understanding and will contact erye dr first and  then will fu at ED if necessary and eye dr cannot see her today. Sending note to Dr Milinda Antis and [employer].

## 2023-04-19 NOTE — Telephone Encounter (Signed)
Agree with that advisement- needs attention today I will watch for correspondence
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
## 2023-04-21 ENCOUNTER — Other Ambulatory Visit (HOSPITAL_BASED_OUTPATIENT_CLINIC_OR_DEPARTMENT_OTHER): Payer: Self-pay

## 2023-04-21 ENCOUNTER — Ambulatory Visit: Payer: 59 | Admitting: Family Medicine

## 2023-04-21 VITALS — BP 132/80 | HR 84 | Temp 97.9°F | Ht 67.0 in | Wt 297.0 lb

## 2023-04-21 DIAGNOSIS — M545 Low back pain, unspecified: Secondary | ICD-10-CM

## 2023-04-21 DIAGNOSIS — I10 Essential (primary) hypertension: Secondary | ICD-10-CM | POA: Diagnosis not present

## 2023-04-21 DIAGNOSIS — M546 Pain in thoracic spine: Secondary | ICD-10-CM

## 2023-04-21 DIAGNOSIS — B029 Zoster without complications: Secondary | ICD-10-CM | POA: Insufficient documentation

## 2023-04-21 DIAGNOSIS — F418 Other specified anxiety disorders: Secondary | ICD-10-CM

## 2023-04-21 MED ORDER — IBUPROFEN 800 MG PO TABS
800.0000 mg | ORAL_TABLET | Freq: Three times a day (TID) | ORAL | 1 refills | Status: AC | PRN
Start: 2023-04-21 — End: ?
  Filled 2023-04-21: qty 60, 20d supply, fill #0

## 2023-04-21 MED ORDER — FLUOXETINE HCL 20 MG PO TABS
20.0000 mg | ORAL_TABLET | Freq: Every day | ORAL | 3 refills | Status: AC
  Filled 2023-04-21: qty 90, 90d supply, fill #0
  Filled 2023-04-28: qty 30, 30d supply, fill #0
  Filled 2023-05-27: qty 30, 30d supply, fill #1
  Filled 2023-07-01 – 2023-07-06 (×2): qty 30, 30d supply, fill #2
  Filled 2023-08-02: qty 30, 30d supply, fill #3
  Filled 2023-09-05 – 2023-09-07 (×2): qty 30, 30d supply, fill #4
  Filled 2023-10-11: qty 30, 30d supply, fill #5
  Filled 2023-11-16: qty 30, 30d supply, fill #6
  Filled 2023-12-30: qty 30, 30d supply, fill #7
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
## 2023-04-21 NOTE — Assessment & Plan Note (Signed)
Improved with fluoxetine 20 mg daily /even in setting of stressors  Seeing counselor -encouraged to continue Discussed self care  Reviewed stressors/ coping techniques/symptoms/ support sources/ tx options and side effects in detail today
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
## 2023-04-21 NOTE — Assessment & Plan Note (Signed)
BP: 132/80  Improved blood pressure now with less anxiety  Continues amlodipine 5 mg daily  Propranolol 20 mg tid   Encouraged good health habits Is working on weight loss
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
## 2023-04-21 NOTE — Patient Instructions (Addendum)
Take ibuprofen 800 mg with full stomach as needed for headache and shingles pain   Take care of you Continue counseling   Continue current medicines    Let us know if anything changes

## 2023-04-21 NOTE — Assessment & Plan Note (Signed)
Pt has lost some weight with better habits Mood is better Prozac seems more weight neutral than lexapro Encouraged continued work with nutritionist

## 2023-04-21 NOTE — Assessment & Plan Note (Signed)
On right side of face  Near eye but no oph effects Has seen oph twice and will continue to follow Finished valtrex Ibuprofen prn with food  Reassuring exam
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
## 2023-04-22 LAB — VARICELLA-ZOSTER BY PCR: Varicella-Zoster, PCR: POSITIVE — AB

## 2023-04-28 ENCOUNTER — Other Ambulatory Visit (HOSPITAL_BASED_OUTPATIENT_CLINIC_OR_DEPARTMENT_OTHER): Payer: Self-pay

## 2023-04-28 MED ORDER — PROPRANOLOL HCL ER 80 MG PO CP24
80.0000 mg | ORAL_CAPSULE | Freq: Every evening | ORAL | 3 refills | Status: DC
  Filled 2023-04-28: qty 30, 30d supply, fill #0
  Filled 2023-05-27: qty 30, 30d supply, fill #1
  Filled 2023-07-01 – 2023-07-06 (×2): qty 30, 30d supply, fill #2
  Filled 2023-08-24: qty 30, 30d supply, fill #3

## 2023-05-05 ENCOUNTER — Encounter (INDEPENDENT_AMBULATORY_CARE_PROVIDER_SITE_OTHER): Payer: Self-pay

## 2023-05-27 ENCOUNTER — Other Ambulatory Visit (HOSPITAL_BASED_OUTPATIENT_CLINIC_OR_DEPARTMENT_OTHER): Payer: Self-pay

## 2023-05-31 ENCOUNTER — Ambulatory Visit: Payer: 59 | Admitting: Family Medicine

## 2023-06-04 ENCOUNTER — Other Ambulatory Visit (HOSPITAL_BASED_OUTPATIENT_CLINIC_OR_DEPARTMENT_OTHER): Payer: Self-pay

## 2023-06-04 ENCOUNTER — Other Ambulatory Visit: Payer: Self-pay | Admitting: Family Medicine

## 2023-06-04 MED ORDER — AMLODIPINE BESYLATE 5 MG PO TABS
5.0000 mg | ORAL_TABLET | Freq: Every day | ORAL | 1 refills | Status: DC
  Filled 2023-06-04: qty 30, 30d supply, fill #0
  Filled 2023-07-15: qty 30, 30d supply, fill #1
  Filled 2023-08-24: qty 30, 30d supply, fill #2

## 2023-07-02 ENCOUNTER — Other Ambulatory Visit (HOSPITAL_COMMUNITY): Payer: Self-pay

## 2023-07-06 ENCOUNTER — Other Ambulatory Visit: Payer: Self-pay

## 2023-07-06 ENCOUNTER — Other Ambulatory Visit (HOSPITAL_BASED_OUTPATIENT_CLINIC_OR_DEPARTMENT_OTHER): Payer: Self-pay

## 2023-07-08 ENCOUNTER — Other Ambulatory Visit (HOSPITAL_COMMUNITY): Payer: Self-pay

## 2023-07-12 ENCOUNTER — Other Ambulatory Visit (HOSPITAL_BASED_OUTPATIENT_CLINIC_OR_DEPARTMENT_OTHER): Payer: Self-pay

## 2023-07-12 MED ORDER — PROPRANOLOL HCL ER 80 MG PO CP24
80.0000 mg | ORAL_CAPSULE | Freq: Every evening | ORAL | 3 refills | Status: DC
  Filled 2023-07-12: qty 30, 30d supply, fill #0

## 2023-07-14 ENCOUNTER — Other Ambulatory Visit (HOSPITAL_BASED_OUTPATIENT_CLINIC_OR_DEPARTMENT_OTHER): Payer: Self-pay

## 2023-07-14 MED ORDER — ZEPBOUND 2.5 MG/0.5ML ~~LOC~~ SOAJ
2.5000 mg | SUBCUTANEOUS | 0 refills | Status: DC
  Filled 2023-07-14 – 2023-07-20 (×2): qty 2, 28d supply, fill #0

## 2023-07-18 ENCOUNTER — Other Ambulatory Visit (HOSPITAL_BASED_OUTPATIENT_CLINIC_OR_DEPARTMENT_OTHER): Payer: Self-pay

## 2023-07-20 ENCOUNTER — Other Ambulatory Visit (HOSPITAL_BASED_OUTPATIENT_CLINIC_OR_DEPARTMENT_OTHER): Payer: Self-pay

## 2023-07-22 ENCOUNTER — Ambulatory Visit: Payer: 59 | Admitting: Radiology

## 2023-07-22 ENCOUNTER — Other Ambulatory Visit (HOSPITAL_COMMUNITY)
Admission: RE | Admit: 2023-07-22 | Discharge: 2023-07-22 | Disposition: A | Discharge status: Home / Self Care | Payer: 59 | Source: Ambulatory Visit | Attending: Radiology | Admitting: Radiology

## 2023-07-22 ENCOUNTER — Encounter: Payer: Self-pay | Admitting: Radiology

## 2023-07-22 VITALS — BP 142/92 | Ht 67.0 in | Wt 298.0 lb

## 2023-07-22 DIAGNOSIS — Z01419 Encounter for gynecological examination (general) (routine) without abnormal findings: Secondary | ICD-10-CM

## 2023-07-22 NOTE — Progress Notes (Signed)
   SEMYA KLINKE [birth year] [id number]   History: Postmenopausal 52 y.o. presents for annual exam. No gyn concerns. Known hx of large fibroids. Denies any bleeding or pain.    Gynecologic History Postmenopausal Last Pap: 2021. Results were: normal Last mammogram: 2017. Results were: normal Last colonoscopy: cologuard pending   Obstetric History OB History  Gravida Para Term Preterm AB Living  2 1     1 1   SAB IAB Ectopic Multiple Live Births    1          # Outcome Date GA Lbr [name]/2nd Weight Sex Type Anes PTL Lv  2 IAB           1 Para              The following portions of the patient's history were reviewed and updated as appropriate: allergies, current medications, past family history, past medical history, past social history, past surgical history, and problem list.  Review of Systems Pertinent items noted in HPI and remainder of comprehensive ROS otherwise negative.  Past medical history, past surgical history, family history and social history were all reviewed and documented in the EPIC chart.  Exam:  Vitals:   07/22/23 1151 07/22/23 1157  BP: (!) 146/98 (!) 142/92  Weight: 298 lb (135.2 kg)   Height: 5\' 7"  (1.702 m)    Body mass index is 46.67 kg/m.  General appearance:  Normal, obese Thyroid:  Symmetrical, normal in size, without palpable masses or nodularity. Respiratory  Auscultation:  Clear without wheezing or rhonchi Cardiovascular  Auscultation:  Regular rate, without rubs, murmurs or gallops  Edema/varicosities:  Not grossly evident Abdominal  Soft,nontender, without masses, guarding or rebound.  Liver/spleen:  No organomegaly noted  Hernia:  None appreciated  Skin  Inspection:  Grossly normal Breasts: Examined lying and sitting.   Right: Without masses, retractions, nipple discharge or axillary adenopathy.   Left: Without masses, retractions, nipple discharge or axillary adenopathy. Genitourinary   Inguinal/mons:  Normal without inguinal  adenopathy  External genitalia:  Normal appearing vulva with no masses, tenderness, or lesions  BUS/Urethra/Skene's glands:  Normal  Vagina:  Normal appearing with normal color and discharge, no lesions. Atrophy: mild   Cervix:  Normal appearing without discharge or lesions  Uterus: Enlarged to 16 week size known fibroids  Adnexa/parametria:     Rt: Normal in size, without masses or tenderness.   Lt: Normal in size, without masses or tenderness.  Anus and perineum: Normal    Raynelle Fanning, CMA present for exam  Assessment/Plan:   1. Well woman exam with routine gynecological exam - Cytology - PAP( [facility]) - Schedule mammogram - Complete cologuard - Labs with PCP    Discussed SBE, colonoscopy and DEXA screening as directed. Recommend of exercise weekly, including weight bearing exercise. Encouraged the use of seatbelts and sunscreen.  Return in 1 year for annual or sooner prn.  Tanda Rockers WHNP-BC, 12:17 PM 07/22/2023

## 2023-07-26 LAB — CYTOLOGY - PAP
Adequacy: ABSENT
Comment: NEGATIVE
Diagnosis: NEGATIVE
High risk HPV: NEGATIVE

## 2023-08-02 ENCOUNTER — Other Ambulatory Visit (HOSPITAL_BASED_OUTPATIENT_CLINIC_OR_DEPARTMENT_OTHER): Payer: Self-pay

## 2023-09-07 ENCOUNTER — Other Ambulatory Visit (HOSPITAL_BASED_OUTPATIENT_CLINIC_OR_DEPARTMENT_OTHER): Payer: Self-pay

## 2023-09-08 ENCOUNTER — Encounter: Payer: Self-pay | Admitting: Family Medicine

## 2023-09-09 ENCOUNTER — Telehealth: Payer: Self-pay | Admitting: *Deleted

## 2023-09-09 NOTE — Telephone Encounter (Signed)
Pt sent a message saying:    I've met a few times with a nutritionist through [employer] (my insurance covers at 0 cost) & we have discussed different medications that might help with appetite control/food noise to assist with better eating habits to further assist in weight loss.    Though the semaglutide & similar products have worked for me in the past, my insurance back then stopped covering it & my current insurance does not cover it either. Definitely can't afford the out of pocket costs for those medications.    The nutritionist mentioned asking you if I would be a candidate for Contrave or Qsymia. If so, could I be prescribed one of those? My insurance doesn't cover those either, however, their costs are significantly lower.

## 2023-09-09 NOTE — Telephone Encounter (Signed)
Please schedule pt an f/u with PCP next week to discuss meds

## 2023-09-09 NOTE — Telephone Encounter (Signed)
Addressed through phone note 

## 2023-09-09 NOTE — Telephone Encounter (Signed)
Spoke to pt, scheduled ov for 09/14/23

## 2023-09-09 NOTE — Telephone Encounter (Signed)
Yes, we can discuss those, contrave may be my first choice  Please schedule an appointment  I am currently out of the office

## 2023-09-14 ENCOUNTER — Telehealth: Payer: Self-pay | Admitting: Family Medicine

## 2023-09-14 ENCOUNTER — Other Ambulatory Visit: Payer: Self-pay

## 2023-09-14 ENCOUNTER — Ambulatory Visit: Payer: 59 | Admitting: Family Medicine

## 2023-09-14 ENCOUNTER — Encounter: Payer: Self-pay | Admitting: Family Medicine

## 2023-09-14 ENCOUNTER — Other Ambulatory Visit (HOSPITAL_BASED_OUTPATIENT_CLINIC_OR_DEPARTMENT_OTHER): Payer: Self-pay

## 2023-09-14 DIAGNOSIS — I10 Essential (primary) hypertension: Secondary | ICD-10-CM

## 2023-09-14 DIAGNOSIS — G4709 Other insomnia: Secondary | ICD-10-CM | POA: Diagnosis not present

## 2023-09-14 DIAGNOSIS — Z23 Encounter for immunization: Secondary | ICD-10-CM | POA: Diagnosis not present

## 2023-09-14 MED ORDER — AMLODIPINE BESYLATE 5 MG PO TABS
5.0000 mg | ORAL_TABLET | Freq: Every day | ORAL | 3 refills | Status: AC
  Filled 2023-09-14: qty 90, 90d supply, fill #0
  Filled 2023-09-24: qty 30, 30d supply, fill #0
  Filled 2023-10-22: qty 30, 30d supply, fill #1
  Filled 2023-11-29: qty 30, 30d supply, fill #2
  Filled 2023-12-30: qty 30, 30d supply, fill #3
  Filled 2024-01-25: qty 30, 30d supply, fill #4
  Filled 2024-02-24: qty 30, 30d supply, fill #5
  Filled 2024-04-01: qty 30, 30d supply, fill #6
  Filled 2024-04-20 – 2024-08-15 (×2): qty 30, 30d supply, fill #7

## 2023-09-14 MED ORDER — NALTREXONE-BUPROPION HCL ER 8-90 MG PO TB12: ORAL_TABLET | ORAL | 0 refills | Status: DC

## 2023-09-14 MED ORDER — PROPRANOLOL HCL ER 80 MG PO CP24
80.0000 mg | ORAL_CAPSULE | Freq: Every evening | ORAL | 1 refills | Status: AC
  Filled 2023-09-14: qty 90, 90d supply, fill #0
  Filled 2023-09-24: qty 30, 30d supply, fill #0
  Filled 2023-10-23: qty 30, 30d supply, fill #1

## 2023-09-14 MED ORDER — NALTREXONE-BUPROPION HCL ER 8-90 MG PO TB12
ORAL_TABLET | ORAL | 0 refills | Status: DC
  Filled 2023-09-14: qty 63, 28d supply, fill #0

## 2023-09-14 MED ORDER — TRAZODONE HCL 50 MG PO TABS
50.0000 mg | ORAL_TABLET | Freq: Every evening | ORAL | 3 refills | Status: AC | PRN
Start: 2023-09-14 — End: ?
  Filled 2023-09-14 – 2023-10-22 (×3): qty 30, 30d supply, fill #0

## 2023-09-14 NOTE — Patient Instructions (Addendum)
Keep working on emotional eating  Keep working on better choices   Add some strength training to your routine, this is important for bone and brain health and can reduce your risk of falls and help your body use insulin properly and regulate weight  Light weights, exercise bands , and internet videos are a good way to start  Yoga (chair or regular), machines , floor exercises or a gym with machines are also good options   Go back to the propranolol ER 80 mg daily  Continue other medicines   Try the generic contrave  If any intolerable side effects let us know  If you feel worse (mood) stop it and let us know    Follow up in approx 1 month   Continue meeting with nutritionist

## 2023-09-14 NOTE — Assessment & Plan Note (Signed)
BP: (!) 162/94   Continues amlodipine 5 mg daily  Will add back propranolol ER (taking for headache in past and helped blood pressure) Follow up planned  Lifestyle change discussed in detail also

## 2023-09-14 NOTE — Telephone Encounter (Signed)
Left VM requesting pt to call the office back 

## 2023-09-14 NOTE — Telephone Encounter (Signed)
Pt called asking for a call back from [facility] to discuss on the meds that was prescribed today, Naltrexone-buPROPion HCl ER 8-90 MG TB12. Pt states the concern was confusing & would rather just speak with [facility]. Call back # [phone]

## 2023-09-14 NOTE — Telephone Encounter (Signed)
Pt called back her Contrave through a local pharmacy is going to be $380, she went online and if she gets [place] to send the Rx to the [facility] they will fill the script and ship it to pt for $99  She gave me their phone and fax #  P: [phone] F: [phone]  I called them directly and they advise me that all we have to do is fax a Rx to their fax # listed and they will take care of the rest.  Rx reprinted and placed in PCP's inbox for review and to sign
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
## 2023-09-14 NOTE — Assessment & Plan Note (Signed)
Struggling with large appetite and cravings  Multifactorial , hormonal change also GLP-1 helped in past -ins stopped covering  Compounded version did not do well  Working with nutritionist  Working on emotional eating and better choices  Will try contrave for cravings / appetite  Discussed possible side effects including mood change -will watch for / callback parameters noted  Will start the starter pack and follow up   Encouraged more strength building exercise also

## 2023-09-14 NOTE — Telephone Encounter (Signed)
PCP signed order and I faxed it to [facility] directly and pt was notified

## 2023-09-24 ENCOUNTER — Other Ambulatory Visit (HOSPITAL_BASED_OUTPATIENT_CLINIC_OR_DEPARTMENT_OTHER): Payer: Self-pay

## 2023-09-28 ENCOUNTER — Other Ambulatory Visit (HOSPITAL_BASED_OUTPATIENT_CLINIC_OR_DEPARTMENT_OTHER): Payer: Self-pay

## 2023-10-08 ENCOUNTER — Other Ambulatory Visit (HOSPITAL_BASED_OUTPATIENT_CLINIC_OR_DEPARTMENT_OTHER): Payer: Self-pay

## 2023-10-11 ENCOUNTER — Other Ambulatory Visit (HOSPITAL_BASED_OUTPATIENT_CLINIC_OR_DEPARTMENT_OTHER): Payer: Self-pay

## 2023-10-15 ENCOUNTER — Ambulatory Visit: Payer: 59 | Admitting: Family Medicine

## 2023-10-20 ENCOUNTER — Encounter: Payer: Self-pay | Admitting: Family Medicine

## 2023-10-20 ENCOUNTER — Ambulatory Visit: Payer: 59 | Admitting: Family Medicine

## 2023-10-20 VITALS — BP 140/85 | HR 65 | Temp 98.3°F | Ht 67.0 in | Wt 296.0 lb

## 2023-10-20 DIAGNOSIS — G4709 Other insomnia: Secondary | ICD-10-CM

## 2023-10-20 DIAGNOSIS — I10 Essential (primary) hypertension: Secondary | ICD-10-CM

## 2023-10-20 DIAGNOSIS — Z6841 Body Mass Index (BMI) 40.0 and over, adult: Secondary | ICD-10-CM | POA: Diagnosis not present

## 2023-10-20 DIAGNOSIS — F418 Other specified anxiety disorders: Secondary | ICD-10-CM | POA: Diagnosis not present

## 2023-10-20 MED ORDER — NALTREXONE-BUPROPION HCL ER 8-90 MG PO TB12: 2.0000 | ORAL_TABLET | Freq: Two times a day (BID) | ORAL | 5 refills | Status: DC

## 2023-10-20 MED ORDER — NALTREXONE-BUPROPION HCL ER 8-90 MG PO TB12: 2.0000 | ORAL_TABLET | Freq: Two times a day (BID) | ORAL | 5 refills | Status: AC

## 2023-10-20 NOTE — Assessment & Plan Note (Signed)
 Overall mood is improved / feels more hopeful and energetic  Continues trazodone  for sleep Fluoxetine  20 mg daily  Continues counseling which is helpful  Also working on self care and trying to add physical activity

## 2023-10-20 NOTE — Progress Notes (Signed)
 Subjective:    Patient ID: Alexa Anderson, female    DOB: 1971-05-04, 53 y.o.   MRN: [id number]  HPI  Wt Readings from Last 3 Encounters:  10/20/23 296 lb (134.3 kg)  09/14/23 299 lb 6 oz (135.8 kg)  07/22/23 298 lb (135.2 kg)   46.36 kg/m  Vitals:   10/20/23 1404 10/20/23 1426  BP: (!) 150/94 (!) 140/85  Pulse: 65   Temp: 98.3 F (36.8 C)   SpO2: 95%     Pt presents for follow up of HTN and obesity and chronic medical problems   Feels better overall  More energy  More hopeful and positivity   HTN bp is stable today  No cp or palpitations or headaches or edema  No side effects to medicines  BP Readings from Last 3 Encounters:  10/20/23 (!) 140/85  09/14/23 (!) 162/94  07/22/23 (!) 142/92    Amlodipine  5 mg daily  We added back propranolol  ER 80 mg daily last time  Working on lifestyle changes   Has not checked at home  No side effects   Pulse Readings from Last 3 Encounters:  10/20/23 65  09/14/23 70  04/21/23 84   Lab Results  Component Value Date   NA 138 03/30/2023   K 4.1 03/30/2023   CO2 26 03/30/2023   GLUCOSE 94 03/30/2023   BUN 16 03/30/2023   CREATININE 0.62 03/30/2023   CALCIUM 9.4 03/30/2023   GFR 102.64 03/30/2023   EGFR 107 03/09/2022   GFRNONAA 102 03/21/2020    Lab Results  Component Value Date   CHOL 158 03/30/2023   HDL 50.50 03/30/2023   LDLCALC 96 03/30/2023   TRIG 59.0 03/30/2023   CHOLHDL 3 03/30/2023   Lab Results  Component Value Date   ALT 15 03/30/2023   AST 16 03/30/2023   ALKPHOS 110 03/30/2023   BILITOT 0.8 03/30/2023     Morbid obesity  Last bmi 46.8, today 46.3  Last visit noted struggle with large appetite and cravings  Working with nutritionist  GLP-1 not covered by insurance   Discussed trial of contrave   (naltrexone -bupropion  ER 8-90)   Feels better  A little constipation week 2  Started using miralax in coffee-helpful  May make her a little sleepy and also some weird dream   Has reduced  appetite  Cravings are a little less   Making a big effort to eat better  No fried foods   More nutritious food - working on that with nutritionist  Also mindul of protein   Found a therapist to talk to also ([facility]) - insurance covers that   Exercise  Some line dancing/practice Walking longer with dog     Patient Active Problem List   Diagnosis Date Noted   Asthma 02/02/2022   Colon cancer screening 05/09/2021   Elevated glucose level 03/12/2020   Enlarged uterus 02/19/2020   Enlarged ovary 02/19/2020   Tinnitus 10/24/2018   Encounter for screening mammogram for breast cancer 05/12/2016   Encounter for general adult medical examination with abnormal findings 09/05/2013   Encounter for routine gynecological examination 09/05/2013   Stress reaction 09/05/2013   Goiter 06/05/2008   Morbid obesity (HCC) 06/04/2008   Depression with anxiety 06/04/2008   Migraine without aura 06/04/2008   Essential hypertension 06/04/2008   Past Medical History:  Diagnosis Date   Anxiety    Depression    Goiter    History of depression    Hypertension    Migraines  Obesity    Weight loss    use to wiegh over 400 lbs   Past Surgical History:  Procedure Laterality Date   CESAREAN SECTION     thyroid  ultrasound  11/05/2010   stable 5 mm nodule hypoechoic on left   TOOTH EXTRACTION  05/2022   WISDOM TOOTH EXTRACTION     Social History   Tobacco Use   Smoking status: Never    Passive exposure: Never   Smokeless tobacco: Never  Vaping Use   Vaping status: Never Used  Substance Use Topics   Alcohol use: Yes    Comment: Occasional   Drug use: No   Family History  Problem Relation Age of Onset   Obesity Mother    Cancer Father        renal cell   Parkinsonism Father    Allergies  Allergen Reactions   Bee Venom Anaphylaxis   Current Outpatient Medications on File Prior to Visit  Medication Sig Dispense Refill   albuterol  (VENTOLIN  HFA) 108 (90 Base) MCG/ACT  inhaler Inhale 1-2 puffs into the lungs every 6 (six) hours as needed for wheezing or shortness of breath. 8.5 g 3   amLODipine  (NORVASC ) 5 MG tablet Take 1 tablet (5 mg total) by mouth daily. 90 tablet 3   Cetirizine HCl (ZYRTEC PO) Take by mouth.     FLUoxetine  (PROZAC ) 20 MG tablet Take 1 tablet (20 mg total) by mouth daily. 90 tablet 3   ibuprofen  (ADVIL ) 800 MG tablet Take 1 tablet (800 mg total) by mouth every 8 (eight) hours as needed for moderate pain. Take on a full stomach. 60 tablet 1   MAGNESIUM PO Take by mouth.     Omega-3 Fatty Acids (OMEGA 3 PO) Take by mouth.     propranolol  ER (INDERAL  LA) 80 MG 24 hr capsule Take 1 capsule (80 mg total) by mouth every evening. 90 capsule 1   traZODone  (DESYREL ) 50 MG tablet Take 1 tablet (50 mg total) by mouth at bedtime as needed for sleep. 30 tablet 3   No current facility-administered medications on file prior to visit.    Review of Systems  Constitutional:  Negative for activity change, appetite change, fatigue, fever and unexpected weight change.  HENT:  Negative for congestion, ear pain, rhinorrhea, sinus pressure and sore throat.   Eyes:  Negative for pain, redness and visual disturbance.  Respiratory:  Negative for cough, shortness of breath and wheezing.   Cardiovascular:  Negative for chest pain and palpitations.  Gastrointestinal:  Negative for abdominal pain, blood in stool, constipation and diarrhea.  Endocrine: Negative for polydipsia and polyuria.  Genitourinary:  Negative for dysuria, frequency and urgency.  Musculoskeletal:  Negative for arthralgias, back pain and myalgias.  Skin:  Negative for pallor and rash.  Allergic/Immunologic: Negative for environmental allergies.  Neurological:  Negative for dizziness, syncope and headaches.  Hematological:  Negative for adenopathy. Does not bruise/bleed easily.  Psychiatric/Behavioral:  Negative for decreased concentration, dysphoric mood and sleep disturbance. The patient is  nervous/anxious.        Mood is improved        Objective:   Physical Exam Constitutional:      General: She is not in acute distress.    Appearance: Normal appearance. She is well-developed. She is obese. She is not ill-appearing or diaphoretic.  HENT:     Head: Normocephalic and atraumatic.  Eyes:     Conjunctiva/sclera: Conjunctivae normal.     Pupils: Pupils are equal, round,  and reactive to light.  Neck:     Thyroid : No thyromegaly.     Vascular: No carotid bruit or JVD.  Cardiovascular:     Rate and Rhythm: Normal rate and regular rhythm.     Heart sounds: Normal heart sounds.     No gallop.  Pulmonary:     Effort: Pulmonary effort is normal. No respiratory distress.     Breath sounds: Normal breath sounds. No wheezing or rales.  Abdominal:     General: There is no distension or abdominal bruit.     Palpations: Abdomen is soft.  Musculoskeletal:     Cervical back: Normal range of motion and neck supple.     Right lower leg: No edema.     Left lower leg: No edema.  Lymphadenopathy:     Cervical: No cervical adenopathy.  Skin:    General: Skin is warm and dry.     Coloration: Skin is not pale.     Findings: No rash.  Neurological:     Mental Status: She is alert.     Coordination: Coordination normal.     Deep Tendon Reflexes: Reflexes are normal and symmetric. Reflexes normal.  Psychiatric:        Attention and Perception: Attention normal.        Mood and Affect: Mood normal. Mood is not anxious or depressed.        Speech: Speech normal.        Behavior: Behavior normal.     Comments: Candidly discusses symptoms and stressors             Assessment & Plan:   Problem List Items Addressed This Visit       Cardiovascular and Mediastinum   Essential hypertension - Primary   Blood pressure is improved from last visit  BP: (!) 140/85 (this down from 160s/90s last visit)   Taking Amlodipine  5 mg daily  Propranolol  ER 80 mg daily  Pulse of 65 No  side effects  Working on lifestyle change and weight loss (now taking contrave )  Commended   In light of improvement- will have pt monitor and follow up here in about 2 months If no further improvement consider adding therapy           Other   Morbid obesity (HCC)   Taking contrave  8-90 mg 2 pills bid now  Feeling good/ overall better and in more control of eating  Cravings are decreased a little   Has lost 3 lb so far  Gradually making diet and exercise changes Working with nutritionist and also mental health counselor  Encouraged her to keep up the good work  Add more strength building exercise when able Follow up in about 2 months         Relevant Medications   Naltrexone -buPROPion  HCl ER 8-90 MG TB12   Depression with anxiety   Overall mood is improved / feels more hopeful and energetic  Continues trazodone  for sleep Fluoxetine  20 mg daily  Continues counseling which is helpful  Also working on self care and trying to add physical activity

## 2023-10-20 NOTE — Assessment & Plan Note (Signed)
 Blood pressure is improved from last visit  BP: (!) 140/85 (this down from 160s/90s last visit)   Taking Amlodipine  5 mg daily  Propranolol  ER 80 mg daily  Pulse of 65 No side effects  Working on lifestyle change and weight loss (now taking contrave )  Commended   In light of improvement- will have pt monitor and follow up here in about 2 months If no further improvement consider adding therapy

## 2023-10-20 NOTE — Patient Instructions (Addendum)
 Keep moving  Add muscle when you can Add some strength training to your routine, this is important for bone and brain health and can reduce your risk of falls and help your body use insulin  properly and regulate weight  Light weights, exercise bands , and internet videos are a good way to start  Yoga (chair or regular), machines , floor exercises or a gym with machines are also good options    Continue counseling and nutrition therapy  Keep trying new things  Small changes gradually  Continue current medicines   I will send refill for contrave  to the pharamcy   Keep checking with your insurance co regarding coverage of GLP-1 medicines   Blood pressure is improving  Follow up in about 2 months

## 2023-10-20 NOTE — Assessment & Plan Note (Signed)
 Taking contrave  8-90 mg 2 pills bid now  Feeling good/ overall better and in more control of eating  Cravings are decreased a little   Has lost 3 lb so far  Gradually making diet and exercise changes Working with nutritionist and also mental health counselor  Encouraged her to keep up the good work  Add more strength building exercise when able Follow up in about 2 months

## 2023-10-23 ENCOUNTER — Other Ambulatory Visit (HOSPITAL_BASED_OUTPATIENT_CLINIC_OR_DEPARTMENT_OTHER): Payer: Self-pay

## 2023-11-17 ENCOUNTER — Other Ambulatory Visit (HOSPITAL_BASED_OUTPATIENT_CLINIC_OR_DEPARTMENT_OTHER): Payer: Self-pay

## 2023-11-17 ENCOUNTER — Other Ambulatory Visit: Payer: Self-pay | Admitting: Family Medicine

## 2023-11-17 ENCOUNTER — Other Ambulatory Visit: Payer: Self-pay

## 2023-11-17 ENCOUNTER — Encounter (HOSPITAL_BASED_OUTPATIENT_CLINIC_OR_DEPARTMENT_OTHER): Payer: Self-pay

## 2023-11-17 DIAGNOSIS — J4540 Moderate persistent asthma, uncomplicated: Secondary | ICD-10-CM

## 2023-11-29 ENCOUNTER — Other Ambulatory Visit (HOSPITAL_BASED_OUTPATIENT_CLINIC_OR_DEPARTMENT_OTHER): Payer: Self-pay

## 2023-12-12 ENCOUNTER — Telehealth: Admitting: Physician Assistant

## 2023-12-12 DIAGNOSIS — J019 Acute sinusitis, unspecified: Secondary | ICD-10-CM | POA: Diagnosis not present

## 2023-12-12 MED ORDER — AMOXICILLIN-POT CLAVULANATE 875-125 MG PO TABS: 1.0000 | ORAL_TABLET | Freq: Two times a day (BID) | ORAL | 0 refills | Status: AC

## 2023-12-12 NOTE — Progress Notes (Signed)
E-Visit for Sinus Problems  We are sorry that you are not feeling well.  Here is how we plan to help!  Based on what you have shared with me it looks like you have sinusitis.  Sinusitis is inflammation and infection in the sinus cavities of the head.  Based on your presentation I believe you most likely have Acute Bacterial Sinusitis.  This is an infection caused by bacteria and is treated with antibiotics. I have prescribed Augmentin 875mg/125mg one tablet twice daily with food, for 7 days. You may use an oral decongestant such as Mucinex D or if you have glaucoma or high blood pressure use plain Mucinex. Saline nasal spray help and can safely be used as often as needed for congestion.  If you develop worsening sinus pain, fever or notice severe headache and vision changes, or if symptoms are not better after completion of antibiotic, please schedule an appointment with a health care provider.    Sinus infections are not as easily transmitted as other respiratory infection, however we still recommend that you avoid close contact with loved ones, especially the very [place] and elderly.  Remember to wash your hands thoroughly throughout the day as this is the number one way to prevent the spread of infection!  Home Care: Only take medications as instructed by your medical team. Complete the entire course of an antibiotic. Do not take these medications with alcohol. A steam or ultrasonic humidifier can help congestion.  You can place a towel over your head and breathe in the steam from hot water coming from a faucet. Avoid close contacts especially the very [place] and the elderly. Cover your mouth when you cough or sneeze. Always remember to wash your hands.  Get Help Right Away If: You develop worsening fever or sinus pain. You develop a severe head ache or visual changes. Your symptoms persist after you have completed your treatment plan.  Make sure you Understand these instructions. Will watch  your condition. Will get help right away if you are not doing well or get worse.  Thank you for choosing an e-visit.  Your e-visit answers were reviewed by a board certified advanced clinical practitioner to complete your personal care plan. Depending upon the condition, your plan could have included both over the counter or prescription medications.  Please review your pharmacy choice. Make sure the pharmacy is open so you can pick up prescription now. If there is a problem, you may contact your provider through MyChart messaging and have the prescription routed to another pharmacy.  Your safety is important to us. If you have drug allergies check your prescription carefully.   For the next 24 hours you can use MyChart to ask questions about today's visit, request a non-urgent call back, or ask for a work or school excuse. You will get an email in the next two days asking about your experience. I hope that your e-visit has been valuable and will speed your recovery.   I have spent 5 minutes in review of e-visit questionnaire, review and updating patient chart, medical decision making and response to patient.   Xoey Warmoth Z Ward, PA-C    

## 2023-12-20 ENCOUNTER — Ambulatory Visit: Payer: 59 | Admitting: Family Medicine

## 2023-12-31 ENCOUNTER — Other Ambulatory Visit (HOSPITAL_BASED_OUTPATIENT_CLINIC_OR_DEPARTMENT_OTHER): Payer: Self-pay

## 2023-12-31 MED ORDER — PREDNISONE 5 MG (21) PO TBPK
ORAL_TABLET | ORAL | 0 refills | Status: AC
  Filled 2023-12-31: qty 21, 6d supply, fill #0

## 2023-12-31 MED ORDER — PROPRANOLOL HCL 20 MG PO TABS
20.0000 mg | ORAL_TABLET | Freq: Every day | ORAL | 0 refills | Status: AC | PRN
  Filled 2023-12-31: qty 24, 12d supply, fill #0

## 2023-12-31 MED ORDER — MONTELUKAST SODIUM 10 MG PO TABS
10.0000 mg | ORAL_TABLET | Freq: Every day | ORAL | 1 refills | Status: DC
  Filled 2023-12-31: qty 30, 30d supply, fill #0
  Filled 2024-01-25: qty 30, 30d supply, fill #1

## 2023-12-31 MED ORDER — BUDESONIDE-FORMOTEROL FUMARATE 160-4.5 MCG/ACT IN AERO
2.0000 | INHALATION_SPRAY | Freq: Two times a day (BID) | RESPIRATORY_TRACT | 1 refills | Status: DC
  Filled 2023-12-31: qty 10.2, 30d supply, fill #0
  Filled 2024-01-23: qty 10.2, 30d supply, fill #1

## 2024-01-17 ENCOUNTER — Other Ambulatory Visit (HOSPITAL_BASED_OUTPATIENT_CLINIC_OR_DEPARTMENT_OTHER): Payer: Self-pay

## 2024-01-17 MED ORDER — DESVENLAFAXINE SUCCINATE ER 50 MG PO TB24
50.0000 mg | ORAL_TABLET | Freq: Every day | ORAL | 1 refills | Status: DC
  Filled 2024-01-17: qty 30, 30d supply, fill #0
  Filled 2024-02-16: qty 30, 30d supply, fill #1

## 2024-01-29 ENCOUNTER — Other Ambulatory Visit (HOSPITAL_BASED_OUTPATIENT_CLINIC_OR_DEPARTMENT_OTHER): Payer: Self-pay

## 2024-02-03 ENCOUNTER — Other Ambulatory Visit (HOSPITAL_BASED_OUTPATIENT_CLINIC_OR_DEPARTMENT_OTHER): Payer: Self-pay

## 2024-02-03 MED ORDER — MONTELUKAST SODIUM 10 MG PO TABS
10.0000 mg | ORAL_TABLET | Freq: Every day | ORAL | 0 refills | Status: AC
  Filled 2024-02-16: qty 30, 30d supply, fill #0
  Filled 2024-04-01: qty 30, 30d supply, fill #1
  Filled 2024-08-15: qty 30, 30d supply, fill #2

## 2024-02-03 MED ORDER — BUDESONIDE-FORMOTEROL FUMARATE 160-4.5 MCG/ACT IN AERO
2.0000 | INHALATION_SPRAY | Freq: Two times a day (BID) | RESPIRATORY_TRACT | 0 refills | Status: AC
  Filled 2024-02-16: qty 10.2, 30d supply, fill #0
  Filled 2024-04-01: qty 10.2, 30d supply, fill #1
  Filled 2024-05-27: qty 10.2, 30d supply, fill #2

## 2024-02-11 ENCOUNTER — Encounter (INDEPENDENT_AMBULATORY_CARE_PROVIDER_SITE_OTHER): Payer: Self-pay | Admitting: Otolaryngology

## 2024-02-14 ENCOUNTER — Other Ambulatory Visit (HOSPITAL_BASED_OUTPATIENT_CLINIC_OR_DEPARTMENT_OTHER): Payer: Self-pay

## 2024-02-14 MED ORDER — PRAZOSIN HCL 1 MG PO CAPS
1.0000 mg | ORAL_CAPSULE | Freq: Every day | ORAL | 3 refills | Status: AC
  Filled 2024-02-14: qty 30, 30d supply, fill #0
  Filled 2024-03-21 – 2024-03-23 (×2): qty 30, 30d supply, fill #1
  Filled 2024-04-20: qty 30, 30d supply, fill #2
  Filled 2024-05-27 (×2): qty 30, 30d supply, fill #3

## 2024-02-17 ENCOUNTER — Other Ambulatory Visit: Payer: Self-pay

## 2024-02-17 ENCOUNTER — Other Ambulatory Visit (HOSPITAL_BASED_OUTPATIENT_CLINIC_OR_DEPARTMENT_OTHER): Payer: Self-pay

## 2024-02-19 ENCOUNTER — Other Ambulatory Visit (HOSPITAL_BASED_OUTPATIENT_CLINIC_OR_DEPARTMENT_OTHER): Payer: Self-pay

## 2024-02-19 MED ORDER — PREDNISOLONE ACETATE 1 % OP SUSP
1.0000 [drp] | Freq: Four times a day (QID) | OPHTHALMIC | 0 refills | Status: AC
  Filled 2024-02-19: qty 5, 25d supply, fill #0

## 2024-02-23 ENCOUNTER — Other Ambulatory Visit (HOSPITAL_BASED_OUTPATIENT_CLINIC_OR_DEPARTMENT_OTHER): Payer: Self-pay

## 2024-02-23 MED ORDER — ALBUTEROL SULFATE HFA 108 (90 BASE) MCG/ACT IN AERS
1.0000 | INHALATION_SPRAY | RESPIRATORY_TRACT | 4 refills | Status: AC
  Filled 2024-02-23: qty 8.5, 28d supply, fill #0
  Filled 2024-04-20: qty 8.5, 28d supply, fill #1

## 2024-03-12 ENCOUNTER — Other Ambulatory Visit (HOSPITAL_BASED_OUTPATIENT_CLINIC_OR_DEPARTMENT_OTHER): Payer: Self-pay

## 2024-03-13 ENCOUNTER — Other Ambulatory Visit (HOSPITAL_BASED_OUTPATIENT_CLINIC_OR_DEPARTMENT_OTHER): Payer: Self-pay

## 2024-03-13 MED ORDER — DESVENLAFAXINE SUCCINATE ER 50 MG PO TB24
50.0000 mg | ORAL_TABLET | Freq: Every day | ORAL | 3 refills | Status: AC
  Filled 2024-03-13 – 2024-03-23 (×2): qty 30, 30d supply, fill #0
  Filled 2024-04-20: qty 30, 30d supply, fill #1
  Filled 2024-05-27: qty 30, 30d supply, fill #2

## 2024-03-21 ENCOUNTER — Other Ambulatory Visit (HOSPITAL_BASED_OUTPATIENT_CLINIC_OR_DEPARTMENT_OTHER): Payer: Self-pay

## 2024-03-23 ENCOUNTER — Other Ambulatory Visit (HOSPITAL_BASED_OUTPATIENT_CLINIC_OR_DEPARTMENT_OTHER): Payer: Self-pay

## 2024-04-20 ENCOUNTER — Other Ambulatory Visit (HOSPITAL_BASED_OUTPATIENT_CLINIC_OR_DEPARTMENT_OTHER): Payer: Self-pay

## 2024-04-20 ENCOUNTER — Ambulatory Visit (INDEPENDENT_AMBULATORY_CARE_PROVIDER_SITE_OTHER): Admitting: Otolaryngology

## 2024-04-20 ENCOUNTER — Encounter (INDEPENDENT_AMBULATORY_CARE_PROVIDER_SITE_OTHER): Payer: Self-pay | Admitting: Otolaryngology

## 2024-04-20 ENCOUNTER — Other Ambulatory Visit: Payer: Self-pay

## 2024-04-20 VITALS — BP 150/108 | HR 84

## 2024-04-20 DIAGNOSIS — R0689 Other abnormalities of breathing: Secondary | ICD-10-CM

## 2024-04-20 DIAGNOSIS — J4520 Mild intermittent asthma, uncomplicated: Secondary | ICD-10-CM

## 2024-04-20 DIAGNOSIS — J386 Stenosis of larynx: Secondary | ICD-10-CM

## 2024-04-20 NOTE — Progress Notes (Signed)
 ENT CONSULT:  Reason for Consult: noisy breathing    HPI: Discussed the use of AI scribe software for clinical note transcription with the patient, who gave verbal consent to proceed.  History of Present Illness Alexa Anderson is a 53 year old female with asthma who presents with noisy breathing. She was referred by Dr. Prentiss for evaluation of noisy breathing.  She experiences a whistling noise when breathing intermittently, which she associates with her asthma. This noise was more prominent before she started using Symbicort  and albuterol  and is described as originating from her nose at times.  Her asthma is currently well-controlled with the use of albuterol /Symbicort  and Singulair . When on these medications, her symptoms are well-managed, and she does not experience the noisy breathing.  No history of autoimmune conditions or intubations. No trouble with swallowing. Denies dyspnea at rest. Denies voice changes.    Records Reviewed:  PCP office visit 09/14/23 Being followed for morbid obesity working with nutritionist, HTN     Past Medical History:  Diagnosis Date   Anxiety    Depression    Goiter    History of depression    Hypertension    Migraines    Obesity    Weight loss    use to wiegh over 400 lbs    Past Surgical History:  Procedure Laterality Date   CESAREAN SECTION     thyroid  ultrasound  11/05/2010   stable 5 mm nodule hypoechoic on left   TOOTH EXTRACTION  05/2022   WISDOM TOOTH EXTRACTION      Family History  Problem Relation Age of Onset   Obesity Mother    Cancer Father        renal cell   Parkinsonism Father     Social History:  reports that she has never smoked. She has never been exposed to tobacco smoke. She has never used smokeless tobacco. She reports current alcohol use. She reports that she does not use drugs.  Allergies:  Allergies  Allergen Reactions   Bee Venom Anaphylaxis    Medications: I have reviewed the patient's current  medications.  The PMH, PSH, Medications, Allergies, and SH were reviewed and updated.  ROS: Constitutional: Negative for fever, weight loss and weight gain. Cardiovascular: Negative for chest pain and dyspnea on exertion. Respiratory: Is not experiencing shortness of breath at rest. Gastrointestinal: Negative for nausea and vomiting. Neurological: Negative for headaches. Psychiatric: The patient is not nervous/anxious  Blood pressure (!) 150/108, pulse 84, last menstrual period 11/20/2020, SpO2 97%. There is no height or weight on file to calculate BMI.  PHYSICAL EXAM:  Exam: General: Well-developed, well-nourished Communication and Voice: Clear pitch and clarity Respiratory Respiratory effort: Equal inspiration and expiration without stridor Cardiovascular Peripheral Vascular: Warm extremities with equal color/perfusion Eyes: No nystagmus with equal extraocular motion bilaterally Neuro/Psych/Balance: Patient oriented to person, place, and time; Appropriate mood and affect; Gait is intact with no imbalance; Cranial nerves I-XII are intact Head and Face Inspection: Normocephalic and atraumatic without mass or lesion Palpation: Facial skeleton intact without bony stepoffs Salivary Glands: No mass or tenderness Facial Strength: Facial motility symmetric and full bilaterally ENT Pinna: External ear intact and fully developed External canal: Canal is patent with intact skin Tympanic Membrane: Clear and mobile External Nose: No scar or anatomic deformity Internal Nose: Septum is relatively straight. No polyp, or purulence. Mucosal edema and erythema present.  Bilateral inferior turbinate hypertrophy.  Lips, Teeth, and gums: Mucosa and teeth intact and viable TMJ: No pain  to palpation with full mobility Oral cavity/oropharynx: No erythema or exudate, no lesions present Nasopharynx: No mass or lesion with intact mucosa Hypopharynx: Intact mucosa without pooling of  secretions Larynx Glottic: Full true vocal cord mobility without lesion or mass Supraglottic: Normal appearing epiglottis and AE folds Interarytenoid Space: Moderate pachydermia&edema Subglottic Space: Patent without lesion or edema.Corkscrew area of scar with minimal to no narrowing of the airway beginning just inferior to the left VF and seen around the right proximal trachea Neck Neck and Trachea: Midline trachea without mass or lesion Thyroid : No mass or nodularity Lymphatics: No lymphadenopathy  Procedure: Preoperative diagnosis: stridor and noisy breathing   Postoperative diagnosis:   Same + subglottic stenosis   Procedure: Flexible fiberoptic laryngoscopy  Surgeon: Elena Larry, MD  Anesthesia: Topical lidocaine and Afrin Complications: None Condition is stable throughout exam  Indications and consent:  The patient presents to the clinic with above symptoms. Indirect laryngoscopy view was incomplete. Thus it was recommended that they undergo a flexible fiberoptic laryngoscopy. All of the risks, benefits, and potential complications were reviewed with the patient preoperatively and verbal informed consent was obtained.  Procedure: The patient was seated upright in the clinic. Topical lidocaine and Afrin were applied to the nasal cavity. After adequate anesthesia had occurred, I then proceeded to pass the flexible telescope into the nasal cavity. The nasal cavity was patent without rhinorrhea or polyp. The nasopharynx was also patent without mass or lesion. The base of tongue was visualized and was normal. There were no signs of pooling of secretions in the piriform sinuses. The true vocal folds were mobile bilaterally. There were no signs of glottic or supraglottic mucosal lesion or mass. The was minimal scar in subglottic region in corkscrew configuration. There was 10% airway narrowing. There was moderate interarytenoid pachydermia and post cricoid edema. The telescope was then  slowly withdrawn and the patient tolerated the procedure throughout.     Assessment/Plan: Encounter Diagnoses  Name Primary?   Subglottic stenosis Yes   Noisy breathing    Mild intermittent asthma without complication     Assessment and Plan Assessment & Plan Noisy breathing and evidence of subglottic stenosis on scope exam today with minimal scar in subglottic region in corkscrew configuration with 10% airway narrowing. No hx of intubation airway trauma or auto-immune conditions. No sx to suggest auto-immune etiology. 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  - Schedule steroid injections in office with topical anesthetic. - Track response using stenosis photo in chart. - will consider labs if does not respond to serial steroid injections.    Thank you for allowing me to participate in the care of this patient. Please do not hesitate to contact me with any questions or concerns.   Elena Larry, MD Otolaryngology [facility] Phone: [phone] Fax: [phone]    04/22/2024, 12:43 PM

## 2024-04-22 ENCOUNTER — Other Ambulatory Visit (HOSPITAL_BASED_OUTPATIENT_CLINIC_OR_DEPARTMENT_OTHER): Payer: Self-pay

## 2024-04-26 ENCOUNTER — Other Ambulatory Visit (HOSPITAL_BASED_OUTPATIENT_CLINIC_OR_DEPARTMENT_OTHER): Payer: Self-pay

## 2024-04-26 MED ORDER — BUDESONIDE-FORMOTEROL FUMARATE 160-4.5 MCG/ACT IN AERO
2.0000 | INHALATION_SPRAY | Freq: Two times a day (BID) | RESPIRATORY_TRACT | 3 refills | Status: AC
  Filled 2024-05-01: qty 10.2, 30d supply, fill #0
  Filled 2024-08-15: qty 10.2, 30d supply, fill #1

## 2024-04-26 MED ORDER — MONTELUKAST SODIUM 10 MG PO TABS
10.0000 mg | ORAL_TABLET | Freq: Every day | ORAL | 1 refills | Status: AC
  Filled 2024-05-01 – 2024-05-03 (×2): qty 90, 90d supply, fill #0

## 2024-04-26 MED ORDER — AMLODIPINE BESYLATE 5 MG PO TABS
5.0000 mg | ORAL_TABLET | Freq: Every day | ORAL | 0 refills | Status: AC
  Filled 2024-05-01 – 2024-05-03 (×2): qty 90, 90d supply, fill #0

## 2024-04-26 MED ORDER — ALBUTEROL SULFATE HFA 108 (90 BASE) MCG/ACT IN AERS
1.0000 | INHALATION_SPRAY | RESPIRATORY_TRACT | 4 refills | Status: AC
  Filled 2024-04-26 – 2024-05-27 (×2): qty 8.5, 28d supply, fill #0
  Filled 2024-08-15: qty 6.7, 23d supply, fill #1

## 2024-05-02 ENCOUNTER — Other Ambulatory Visit (HOSPITAL_BASED_OUTPATIENT_CLINIC_OR_DEPARTMENT_OTHER): Payer: Self-pay

## 2024-05-03 ENCOUNTER — Other Ambulatory Visit (HOSPITAL_BASED_OUTPATIENT_CLINIC_OR_DEPARTMENT_OTHER): Payer: Self-pay

## 2024-05-03 ENCOUNTER — Ambulatory Visit (INDEPENDENT_AMBULATORY_CARE_PROVIDER_SITE_OTHER): Admitting: Otolaryngology

## 2024-05-27 ENCOUNTER — Other Ambulatory Visit (HOSPITAL_COMMUNITY): Payer: Self-pay

## 2024-05-27 ENCOUNTER — Other Ambulatory Visit (HOSPITAL_BASED_OUTPATIENT_CLINIC_OR_DEPARTMENT_OTHER): Payer: Self-pay

## 2024-05-31 ENCOUNTER — Other Ambulatory Visit (HOSPITAL_COMMUNITY): Payer: Self-pay

## 2024-08-15 ENCOUNTER — Other Ambulatory Visit: Payer: Self-pay

## 2024-08-15 ENCOUNTER — Other Ambulatory Visit (HOSPITAL_BASED_OUTPATIENT_CLINIC_OR_DEPARTMENT_OTHER): Payer: Self-pay

## 2024-10-06 ENCOUNTER — Other Ambulatory Visit (HOSPITAL_COMMUNITY): Payer: Self-pay

## 2024-10-06 ENCOUNTER — Other Ambulatory Visit (HOSPITAL_BASED_OUTPATIENT_CLINIC_OR_DEPARTMENT_OTHER): Payer: Self-pay

## 2024-10-12 ENCOUNTER — Telehealth: Payer: Self-pay | Admitting: Family Medicine

## 2024-10-12 NOTE — Telephone Encounter (Signed)
 Copied from CRM [id number]. Topic: Appointments - Transfer of Care >> Oct 12, 2024 10:31 AM Victoria A wrote: Pt is requesting to transfer FROM: Dr.Marne Tower Pt is requesting to transfer TO: Dr. Geni Shutter Reason for requested transfer: Prefer Dr. Shutter It is the responsibility of the team the patient would like to transfer to (Dr. Shutter) to reach out to the patient if for any reason this transfer is not acceptable.

## 2024-10-12 NOTE — Telephone Encounter (Signed)
 Fine with me if Dr Prentiss accepts

## 2024-11-14 ENCOUNTER — Encounter: Admitting: Family Medicine
